# Patient Record
Sex: Female | Born: 1935 | Race: White | Hispanic: No | Marital: Married | Smoking: Never smoker
Health system: Southern US, Community
[De-identification: ages and names within clinical notes are randomized; demographics above are authoritative.]

## PROBLEM LIST (undated history)

## (undated) DIAGNOSIS — I10 Essential (primary) hypertension: Secondary | ICD-10-CM

## (undated) DIAGNOSIS — C50919 Malignant neoplasm of unspecified site of unspecified female breast: Secondary | ICD-10-CM

## (undated) DIAGNOSIS — C55 Malignant neoplasm of uterus, part unspecified: Secondary | ICD-10-CM

## (undated) HISTORY — DX: Malignant neoplasm of uterus, part unspecified: C55

## (undated) HISTORY — PX: COLONOSCOPY: SHX174

## (undated) HISTORY — PX: MASTECTOMY: SHX3

## (undated) HISTORY — DX: Malignant neoplasm of unspecified site of unspecified female breast: C50.919

## (undated) HISTORY — DX: Essential (primary) hypertension: I10

## (undated) HISTORY — PX: ABDOMINAL HYSTERECTOMY: SHX81

---

## 1999-12-09 ENCOUNTER — Other Ambulatory Visit: Admission: RE | Admit: 1999-12-09 | Discharge: 1999-12-09 | Payer: Self-pay | Admitting: *Deleted

## 2000-12-28 ENCOUNTER — Other Ambulatory Visit: Admission: RE | Admit: 2000-12-28 | Discharge: 2000-12-28 | Payer: Self-pay | Admitting: *Deleted

## 2003-05-09 ENCOUNTER — Other Ambulatory Visit: Admission: RE | Admit: 2003-05-09 | Discharge: 2003-05-09 | Payer: Self-pay | Admitting: *Deleted

## 2004-07-21 ENCOUNTER — Other Ambulatory Visit: Admission: RE | Admit: 2004-07-21 | Discharge: 2004-07-21 | Payer: Self-pay | Admitting: *Deleted

## 2004-12-08 ENCOUNTER — Encounter: Admission: RE | Admit: 2004-12-08 | Discharge: 2004-12-08 | Payer: Self-pay | Admitting: Cardiology

## 2006-11-25 ENCOUNTER — Other Ambulatory Visit: Admission: RE | Admit: 2006-11-25 | Discharge: 2006-11-25 | Payer: Self-pay | Admitting: *Deleted

## 2008-01-31 ENCOUNTER — Other Ambulatory Visit: Admission: RE | Admit: 2008-01-31 | Discharge: 2008-01-31 | Payer: Self-pay | Admitting: *Deleted

## 2010-01-24 ENCOUNTER — Encounter (INDEPENDENT_AMBULATORY_CARE_PROVIDER_SITE_OTHER): Payer: Self-pay | Admitting: *Deleted

## 2010-01-27 ENCOUNTER — Ambulatory Visit: Payer: Self-pay | Admitting: Gastroenterology

## 2010-02-03 ENCOUNTER — Ambulatory Visit: Payer: Self-pay | Admitting: Gastroenterology

## 2010-02-05 ENCOUNTER — Encounter: Payer: Self-pay | Admitting: Gastroenterology

## 2010-02-28 ENCOUNTER — Inpatient Hospital Stay (HOSPITAL_COMMUNITY): Admission: EM | Admit: 2010-02-28 | Discharge: 2010-03-10 | Payer: Self-pay | Admitting: Emergency Medicine

## 2010-02-28 ENCOUNTER — Ambulatory Visit: Payer: Self-pay | Admitting: Pulmonary Disease

## 2010-11-23 ENCOUNTER — Encounter: Payer: Self-pay | Admitting: Specialist

## 2010-12-02 NOTE — Letter (Signed)
Summary: [facility] Instructions  [facility]  [address] [place], [place] [address]   Phone: [phone]  Fax: [phone]       ELIZETTE SHEK    [birth year]    MRN: [id number]        Procedure Day /Date:  Monday 02/03/2010     Arrival Time: 8:30 am      Procedure Time: 9:30 am     Location of Procedure:                    _x _  [facility] (4th Floor)                        PREPARATION FOR COLONOSCOPY WITH MOVIPREP   Starting 5 days prior to your procedure Wednesday 3/30 do not eat nuts, seeds, popcorn, corn, beans, peas,  salads, or any raw vegetables.  Do not take any fiber supplements (e.g. Metamucil, Citrucel, and Benefiber).  THE DAY BEFORE YOUR PROCEDURE         DATE: Sunday 4/3  1.  Drink clear liquids the entire day-NO SOLID FOOD  2.  Do not drink anything colored red or purple.  Avoid juices with pulp.  No orange juice.  3.  Drink at least 64 oz. (8 glasses) of fluid/clear liquids during the day to prevent dehydration and help the prep work efficiently.  CLEAR LIQUIDS INCLUDE: Water Jello Ice Popsicles Tea (sugar ok, no milk/cream) Powdered fruit flavored drinks Coffee (sugar ok, no milk/cream) Gatorade Juice: apple, white grape, white cranberry  Lemonade Clear bullion, consomm, broth Carbonated beverages (any kind) Strained chicken noodle soup Hard Candy                             4.  In the morning, mix first dose of MoviPrep solution:    Empty 1 Pouch A and 1 Pouch B into the disposable container    Add lukewarm drinking water to the top line of the container. Mix to dissolve    Refrigerate (mixed solution should be used within 24 hrs)  5.  Begin drinking the prep at 5:00 p.m. The MoviPrep container is divided by 4 marks.   Every 15 minutes drink the solution down to the next mark (approximately 8 oz) until the full liter is complete.   6.  Follow completed prep with 16 oz of clear liquid of your choice (Nothing red  or purple).  Continue to drink clear liquids until bedtime.  7.  Before going to bed, mix second dose of MoviPrep solution:    Empty 1 Pouch A and 1 Pouch B into the disposable container    Add lukewarm drinking water to the top line of the container. Mix to dissolve    Refrigerate  THE DAY OF YOUR PROCEDURE      DATE: Monday 4/4  Beginning at 4:30 am (5 hours before procedure):         1. Every 15 minutes, drink the solution down to the next mark (approx 8 oz) until the full liter is complete.  2. Follow completed prep with 16 oz. of clear liquid of your choice.    3. You may drink clear liquids until 7:30 am (2 HOURS BEFORE PROCEDURE).   MEDICATION INSTRUCTIONS  Unless otherwise instructed, you should take regular prescription medications with a small sip of water   as early as possible the morning of  your procedure.    Additional medication instructions: Do not take fluid pill day of procedure.         OTHER INSTRUCTIONS  You will need a responsible adult at least 75 years of age to accompany you and drive you home.   This person must remain in the waiting room during your procedure.  Wear loose fitting clothing that is easily removed.  Leave jewelry and other valuables at home.  However, you may wish to bring a book to read or  an iPod/MP3 player to listen to music as you wait for your procedure to start.  Remove all body piercing jewelry and leave at home.  Total time from sign-in until discharge is approximately 2-3 hours.  You should go home directly after your procedure and rest.  You can resume normal activities the  day after your procedure.  The day of your procedure you should not:   Drive   Make legal decisions   Operate machinery   Drink alcohol   Return to work  You will receive specific instructions about eating, activities and medications before you leave.    The above instructions have been reviewed and explained to me by   Ezra Sites RN  January 27, 2010 1:24 PM    I fully understand and can verbalize these instructions _____________________________ Date _________

## 2010-12-02 NOTE — Procedures (Signed)
Summary: Colonoscopy  Patient: Cheryl Maldonado Note: All result statuses are Final unless otherwise noted.  Tests: (1) Colonoscopy (COL)   COL Colonoscopy           DONE     [facility]     [address]     [place], [place]  [address]           COLONOSCOPY PROCEDURE REPORT           PATIENT:  Cheryl Maldonado, Cheryl Maldonado  MR#:  [id number]     BIRTHDATE:  06/02/1933, 76 yrs. old  GENDER:  female     ENDOSCOPIST:  Rachael Fee, MD     REF. BY:  Thayer Headings, M.D.     PROCEDURE DATE:  02/03/2010     PROCEDURE:  Colonoscopy with snare polypectomy     ASA CLASS:  Class II     INDICATIONS:  Routine Risk Screening     MEDICATIONS:   Fentanyl 75 mcg IV, Versed 7 mg IV           DESCRIPTION OF PROCEDURE:   After the risks benefits and     alternatives of the procedure were thoroughly explained, informed     consent was obtained.  Digital rectal exam was performed and     revealed no rectal masses.   The LB PCF-H180AL [id number] endoscope     was introduced through the anus and advanced to the cecum, which     was identified by both the appendix and ileocecal valve, without     limitations.  The quality of the prep was excellent, using     MoviPrep.  The instrument was then slowly withdrawn as the colon     was fully examined.     <<PROCEDUREIMAGES>>           FINDINGS:  A pedunculated polyp was found in the sigmoid colon.     This was 7mm across, broad based, completely removed with     snare/cautery and sent to pathology (jar 1) (see image4 and     image5).  This was otherwise a normal examination of the colon     (see image6, image2, and image1).   Retroflexed views in the     rectum revealed no abnormalities.    The scope was then withdrawn     from the patient and the procedure completed.           COMPLICATIONS:  None     ENDOSCOPIC IMPRESSION:     1) Pedunculated polyp in the sigmoid colon, this was removed and     sent to pathology     2) Otherwise normal examination          RECOMMENDATIONS:     1) If the polyp(s) removed today are proven to be adenomatous     (pre-cancerous) polyps, you will need a repeat colonoscopy in 5     years. Otherwise you should continue to follow colorectal cancer     screening guidelines for "routine risk" patients with colonoscopy     in 10 years.     2) You will receive a letter within 1-2 weeks with the results     of your biopsy as well as final recommendations. Please call my     office if you have not received a letter after 3 weeks.           ______________________________     Rachael Fee, MD  n.     eSIGNED:   Rachael Fee at 02/03/2010 09:37 AM           Levey, Karie Kirks, [phone]  Note: An exclamation mark (!) indicates a result that was not dispersed into the flowsheet. Document Creation Date: 02/03/2010 9:38 AM _______________________________________________________________________  (1) Order result status: Final Collection or observation date-time: 02/03/2010 09:32 Requested date-time:  Receipt date-time:  Reported date-time:  Referring Physician:   Ordering Physician: Rob Bunting [phone]) Specimen Source:  Source: Launa Grill Order Number: [id number] Lab site:   Appended Document: Colonoscopy     Procedures Next Due Date:    Colonoscopy: 02/2015

## 2010-12-02 NOTE — Miscellaneous (Signed)
Summary: [facility]  Clinical Lists Changes  Medications: Added new medication of MOVIPREP 100 GM  SOLR (PEG-KCL-NACL-NASULF-NA ASC-C) As per prep instructions. - Signed Rx of MOVIPREP 100 GM  SOLR (PEG-KCL-NACL-NASULF-NA ASC-C) As per prep instructions.;  #1 x 0;  Signed;  Entered by: Ezra Sites RN;  Authorized by: Rachael Fee MD;  Method used: Electronically to [facility]*, [address] [place], [place]  [id number], Ph: [phone], Fax: [phone] Observations: Added new observation of NKA: T (01/27/2010 12:53)    Prescriptions: MOVIPREP 100 GM  SOLR (PEG-KCL-NACL-NASULF-NA ASC-C) As per prep instructions.  #1 x 0   Entered by:   Ezra Sites RN   Authorized by:   Rachael Fee MD   Signed by:   Ezra Sites RN on 01/27/2010   Method used:   Electronically to        [facility]* (retail)       [address]       [place], [place]  [id number]       Ph: [phone]       Fax: [phone]   RxID:   [id number]

## 2010-12-02 NOTE — Letter (Signed)
Summary: Results Letter  [facility]  [address] [place], [place] [address]   Phone: [phone]  Fax: [phone]        February 05, 2010 MRN: [id number]    BOBI DAUDELIN [address] [place], [place]  [address]    Dear Ms. Langan,   The polyp removed during your recent procedure was proven to be adenomatous.  These are pre-cancerous polyps that may have grown into cancers if they had not been removed.  Based on current nationally recognized surveillance guidelines, I recommend that you have a repeat colonoscopy in 5 years.  We will therefore put your information in our reminder system and will contact you in 5 years to schedule a repeat procedure.  Please call if you have any questions or concerns.       Sincerely,  Rachael Fee MD  This letter has been electronically signed by your physician.  Appended Document: Results Letter letter mailed 4.6.11

## 2011-01-20 LAB — PHOSPHORUS
Phosphorus: 1.1 mg/dL — ABNORMAL LOW (ref 2.3–4.6)
Phosphorus: 2.4 mg/dL (ref 2.3–4.6)
Phosphorus: 2.5 mg/dL (ref 2.3–4.6)
Phosphorus: 4 mg/dL (ref 2.3–4.6)

## 2011-01-20 LAB — POCT I-STAT 3, ART BLOOD GAS (G3+)
Acid-base deficit: 3 mmol/L — ABNORMAL HIGH (ref 0.0–2.0)
Acid-base deficit: 8 mmol/L — ABNORMAL HIGH (ref 0.0–2.0)
Bicarbonate: 15.4 mEq/L — ABNORMAL LOW (ref 20.0–24.0)
Bicarbonate: 18.7 meq/L — ABNORMAL LOW (ref 20.0–24.0)
O2 Saturation: 90 %
O2 Saturation: 96 %
Patient temperature: 104.7
Patient temperature: 98.3
TCO2: 16 mmol/L (ref 0–100)
TCO2: 19 mmol/L (ref 0–100)
pCO2 arterial: 25.1 mmHg — ABNORMAL LOW (ref 35.0–45.0)
pCO2 arterial: 26.6 mmHg — ABNORMAL LOW (ref 35.0–45.0)
pH, Arterial: 7.396 (ref 7.350–7.400)
pH, Arterial: 7.467 — ABNORMAL HIGH (ref 7.350–7.400)
pO2, Arterial: 57 mmHg — ABNORMAL LOW (ref 80.0–100.0)
pO2, Arterial: 86 mmHg (ref 80.0–100.0)

## 2011-01-20 LAB — URINALYSIS, ROUTINE W REFLEX MICROSCOPIC
Bilirubin Urine: NEGATIVE
Glucose, UA: NEGATIVE mg/dL
Glucose, UA: NEGATIVE mg/dL
Ketones, ur: NEGATIVE mg/dL
Leukocytes, UA: NEGATIVE
Nitrite: NEGATIVE
Nitrite: POSITIVE — AB
Protein, ur: 30 mg/dL — AB
Protein, ur: NEGATIVE mg/dL
Specific Gravity, Urine: 1.008 (ref 1.005–1.030)
Specific Gravity, Urine: 1.012 (ref 1.005–1.030)
Specific Gravity, Urine: 1.015 (ref 1.005–1.030)
Urobilinogen, UA: 0.2 mg/dL (ref 0.0–1.0)
Urobilinogen, UA: 0.2 mg/dL (ref 0.0–1.0)
pH: 6 (ref 5.0–8.0)
pH: 7 (ref 5.0–8.0)

## 2011-01-20 LAB — BASIC METABOLIC PANEL WITH GFR
BUN: 17 mg/dL (ref 6–23)
BUN: 7 mg/dL (ref 6–23)
BUN: 7 mg/dL (ref 6–23)
BUN: 7 mg/dL (ref 6–23)
CO2: 26 meq/L (ref 19–32)
CO2: 26 meq/L (ref 19–32)
CO2: 26 meq/L (ref 19–32)
CO2: 27 meq/L (ref 19–32)
Calcium: 6.8 mg/dL — ABNORMAL LOW (ref 8.4–10.5)
Calcium: 8.3 mg/dL — ABNORMAL LOW (ref 8.4–10.5)
Calcium: 8.3 mg/dL — ABNORMAL LOW (ref 8.4–10.5)
Calcium: 8.6 mg/dL (ref 8.4–10.5)
Chloride: 100 meq/L (ref 96–112)
Chloride: 101 meq/L (ref 96–112)
Chloride: 103 meq/L (ref 96–112)
Chloride: 96 meq/L (ref 96–112)
Creatinine, Ser: 0.66 mg/dL (ref 0.4–1.2)
Creatinine, Ser: 0.7 mg/dL (ref 0.4–1.2)
Creatinine, Ser: 0.76 mg/dL (ref 0.4–1.2)
Creatinine, Ser: 0.92 mg/dL (ref 0.4–1.2)
GFR calc non Af Amer: 60 mL/min — ABNORMAL LOW
GFR calc non Af Amer: >60 mL/min
GFR calc non Af Amer: >60 mL/min
GFR calc non Af Amer: >60 mL/min
Glucose, Bld: 100 mg/dL — ABNORMAL HIGH (ref 70–99)
Glucose, Bld: 127 mg/dL — ABNORMAL HIGH (ref 70–99)
Glucose, Bld: 73 mg/dL (ref 70–99)
Glucose, Bld: 98 mg/dL (ref 70–99)
Potassium: 2.8 meq/L — ABNORMAL LOW (ref 3.5–5.1)
Potassium: 3.4 meq/L — ABNORMAL LOW (ref 3.5–5.1)
Potassium: 3.5 meq/L (ref 3.5–5.1)
Potassium: 3.9 meq/L (ref 3.5–5.1)
Sodium: 133 meq/L — ABNORMAL LOW (ref 135–145)
Sodium: 134 meq/L — ABNORMAL LOW (ref 135–145)
Sodium: 134 meq/L — ABNORMAL LOW (ref 135–145)
Sodium: 136 meq/L (ref 135–145)

## 2011-01-20 LAB — CBC
HCT: 23.1 % — ABNORMAL LOW (ref 36.0–46.0)
HCT: 25.6 % — ABNORMAL LOW (ref 36.0–46.0)
HCT: 27.1 % — ABNORMAL LOW (ref 36.0–46.0)
HCT: 28.1 % — ABNORMAL LOW (ref 36.0–46.0)
HCT: 29.5 % — ABNORMAL LOW (ref 36.0–46.0)
HCT: 30.2 % — ABNORMAL LOW (ref 36.0–46.0)
HCT: 30.8 % — ABNORMAL LOW (ref 36.0–46.0)
HCT: 24 % — ABNORMAL LOW (ref 36.0–46.0)
HCT: 32.4 % — ABNORMAL LOW (ref 36.0–46.0)
Hemoglobin: 10.2 g/dL — ABNORMAL LOW (ref 12.0–15.0)
Hemoglobin: 10.4 g/dL — ABNORMAL LOW (ref 12.0–15.0)
Hemoglobin: 10.5 g/dL — ABNORMAL LOW (ref 12.0–15.0)
Hemoglobin: 8.1 g/dL — ABNORMAL LOW (ref 12.0–15.0)
Hemoglobin: 8.5 g/dL — ABNORMAL LOW (ref 12.0–15.0)
Hemoglobin: 9.3 g/dL — ABNORMAL LOW (ref 12.0–15.0)
Hemoglobin: 9.6 g/dL — ABNORMAL LOW (ref 12.0–15.0)
Hemoglobin: 11.5 g/dL — ABNORMAL LOW (ref 12.0–15.0)
Hemoglobin: 8.5 g/dL — ABNORMAL LOW (ref 12.0–15.0)
MCHC: 34 g/dL (ref 30.0–36.0)
MCHC: 34.1 g/dL (ref 30.0–36.0)
MCHC: 34.1 g/dL (ref 30.0–36.0)
MCHC: 34.5 g/dL (ref 30.0–36.0)
MCHC: 34.5 g/dL (ref 30.0–36.0)
MCHC: 34.9 g/dL (ref 30.0–36.0)
MCHC: 34.9 g/dL (ref 30.0–36.0)
MCHC: 35 g/dL (ref 30.0–36.0)
MCHC: 34.8 g/dL (ref 30.0–36.0)
MCHC: 35.3 g/dL (ref 30.0–36.0)
MCHC: 35.3 g/dL (ref 30.0–36.0)
MCV: 91.5 fL (ref 78.0–100.0)
MCV: 92.1 fL (ref 78.0–100.0)
MCV: 92.3 fL (ref 78.0–100.0)
MCV: 92.9 fL (ref 78.0–100.0)
MCV: 93.3 fL (ref 78.0–100.0)
MCV: 93.6 fL (ref 78.0–100.0)
MCV: 93.8 fL (ref 78.0–100.0)
MCV: 92.1 fL (ref 78.0–100.0)
MCV: 92.5 fL (ref 78.0–100.0)
MCV: 93.3 fL (ref 78.0–100.0)
Platelets: 140 10*3/uL — ABNORMAL LOW (ref 150–400)
Platelets: 155 10*3/uL (ref 150–400)
Platelets: 199 10*3/uL (ref 150–400)
Platelets: 239 10*3/uL (ref 150–400)
Platelets: 336 10*3/uL (ref 150–400)
Platelets: 413 10*3/uL — ABNORMAL HIGH (ref 150–400)
Platelets: 142 K/uL — ABNORMAL LOW (ref 150–400)
Platelets: 148 10*3/uL — ABNORMAL LOW (ref 150–400)
Platelets: 188 10*3/uL (ref 150–400)
RBC: 2.51 MIL/uL — ABNORMAL LOW (ref 3.87–5.11)
RBC: 2.8 MIL/uL — ABNORMAL LOW (ref 3.87–5.11)
RBC: 2.9 MIL/uL — ABNORMAL LOW (ref 3.87–5.11)
RBC: 3 MIL/uL — ABNORMAL LOW (ref 3.87–5.11)
RBC: 3.17 MIL/uL — ABNORMAL LOW (ref 3.87–5.11)
RBC: 3.28 MIL/uL — ABNORMAL LOW (ref 3.87–5.11)
RBC: 3.3 MIL/uL — ABNORMAL LOW (ref 3.87–5.11)
RBC: 2.58 MIL/uL — ABNORMAL LOW (ref 3.87–5.11)
RBC: 3.52 MIL/uL — ABNORMAL LOW (ref 3.87–5.11)
RDW: 13.7 % (ref 11.5–15.5)
RDW: 13.9 % (ref 11.5–15.5)
RDW: 13.9 % (ref 11.5–15.5)
RDW: 14 % (ref 11.5–15.5)
RDW: 14.2 % (ref 11.5–15.5)
RDW: 12.9 % (ref 11.5–15.5)
RDW: 12.9 % (ref 11.5–15.5)
WBC: 12.6 10*3/uL — ABNORMAL HIGH (ref 4.0–10.5)
WBC: 14.6 10*3/uL — ABNORMAL HIGH (ref 4.0–10.5)
WBC: 16.1 10*3/uL — ABNORMAL HIGH (ref 4.0–10.5)
WBC: 20 10*3/uL — ABNORMAL HIGH (ref 4.0–10.5)
WBC: 25.1 10*3/uL — ABNORMAL HIGH (ref 4.0–10.5)
WBC: 26.2 10*3/uL — ABNORMAL HIGH (ref 4.0–10.5)
WBC: 28.7 10*3/uL — ABNORMAL HIGH (ref 4.0–10.5)
WBC: 38.4 10*3/uL — ABNORMAL HIGH (ref 4.0–10.5)
WBC: 21.6 10*3/uL — ABNORMAL HIGH (ref 4.0–10.5)
WBC: 4.1 10*3/uL (ref 4.0–10.5)

## 2011-01-20 LAB — GLUCOSE, CAPILLARY
Glucose-Capillary: 101 mg/dL — ABNORMAL HIGH (ref 70–99)
Glucose-Capillary: 101 mg/dL — ABNORMAL HIGH (ref 70–99)
Glucose-Capillary: 102 mg/dL — ABNORMAL HIGH (ref 70–99)
Glucose-Capillary: 102 mg/dL — ABNORMAL HIGH (ref 70–99)
Glucose-Capillary: 105 mg/dL — ABNORMAL HIGH (ref 70–99)
Glucose-Capillary: 106 mg/dL — ABNORMAL HIGH (ref 70–99)
Glucose-Capillary: 107 mg/dL — ABNORMAL HIGH (ref 70–99)
Glucose-Capillary: 110 mg/dL — ABNORMAL HIGH (ref 70–99)
Glucose-Capillary: 110 mg/dL — ABNORMAL HIGH (ref 70–99)
Glucose-Capillary: 111 mg/dL — ABNORMAL HIGH (ref 70–99)
Glucose-Capillary: 116 mg/dL — ABNORMAL HIGH (ref 70–99)
Glucose-Capillary: 117 mg/dL — ABNORMAL HIGH (ref 70–99)
Glucose-Capillary: 119 mg/dL — ABNORMAL HIGH (ref 70–99)
Glucose-Capillary: 121 mg/dL — ABNORMAL HIGH (ref 70–99)
Glucose-Capillary: 121 mg/dL — ABNORMAL HIGH (ref 70–99)
Glucose-Capillary: 122 mg/dL — ABNORMAL HIGH (ref 70–99)
Glucose-Capillary: 131 mg/dL — ABNORMAL HIGH (ref 70–99)
Glucose-Capillary: 132 mg/dL — ABNORMAL HIGH (ref 70–99)
Glucose-Capillary: 134 mg/dL — ABNORMAL HIGH (ref 70–99)
Glucose-Capillary: 134 mg/dL — ABNORMAL HIGH (ref 70–99)
Glucose-Capillary: 137 mg/dL — ABNORMAL HIGH (ref 70–99)
Glucose-Capillary: 138 mg/dL — ABNORMAL HIGH (ref 70–99)
Glucose-Capillary: 145 mg/dL — ABNORMAL HIGH (ref 70–99)
Glucose-Capillary: 156 mg/dL — ABNORMAL HIGH (ref 70–99)
Glucose-Capillary: 160 mg/dL — ABNORMAL HIGH (ref 70–99)
Glucose-Capillary: 168 mg/dL — ABNORMAL HIGH (ref 70–99)
Glucose-Capillary: 69 mg/dL — ABNORMAL LOW (ref 70–99)
Glucose-Capillary: 76 mg/dL (ref 70–99)
Glucose-Capillary: 77 mg/dL (ref 70–99)
Glucose-Capillary: 78 mg/dL (ref 70–99)
Glucose-Capillary: 79 mg/dL (ref 70–99)
Glucose-Capillary: 86 mg/dL (ref 70–99)
Glucose-Capillary: 89 mg/dL (ref 70–99)
Glucose-Capillary: 91 mg/dL (ref 70–99)
Glucose-Capillary: 97 mg/dL (ref 70–99)
Glucose-Capillary: 99 mg/dL (ref 70–99)
Glucose-Capillary: 109 mg/dL — ABNORMAL HIGH (ref 70–99)
Glucose-Capillary: 112 mg/dL — ABNORMAL HIGH (ref 70–99)
Glucose-Capillary: 123 mg/dL — ABNORMAL HIGH (ref 70–99)
Glucose-Capillary: 130 mg/dL — ABNORMAL HIGH (ref 70–99)
Glucose-Capillary: 139 mg/dL — ABNORMAL HIGH (ref 70–99)
Glucose-Capillary: 142 mg/dL — ABNORMAL HIGH (ref 70–99)
Glucose-Capillary: 143 mg/dL — ABNORMAL HIGH (ref 70–99)
Glucose-Capillary: 145 mg/dL — ABNORMAL HIGH (ref 70–99)
Glucose-Capillary: 99 mg/dL (ref 70–99)

## 2011-01-20 LAB — CARBOXYHEMOGLOBIN
Carboxyhemoglobin: 0.9 % (ref 0.5–1.5)
Carboxyhemoglobin: 1.3 % (ref 0.5–1.5)
Methemoglobin: 0.5 % (ref 0.0–1.5)
Methemoglobin: 1 % (ref 0.0–1.5)
O2 Saturation: 49.5 %
O2 Saturation: 52.2 %
Total hemoglobin: 8.6 g/dL — ABNORMAL LOW (ref 12.5–16.0)
Total hemoglobin: 9 g/dL — ABNORMAL LOW (ref 12.5–16.0)
Total hemoglobin: 9.4 g/dL — ABNORMAL LOW (ref 12.5–16.0)
Total hemoglobin: 9.5 g/dL — ABNORMAL LOW (ref 12.5–16.0)

## 2011-01-20 LAB — COMPREHENSIVE METABOLIC PANEL
ALT: 38 U/L — ABNORMAL HIGH (ref 0–35)
AST: 59 U/L — ABNORMAL HIGH (ref 0–37)
CO2: 22 mEq/L (ref 19–32)
Calcium: 8.6 mg/dL (ref 8.4–10.5)
Chloride: 84 mEq/L — ABNORMAL LOW (ref 96–112)
GFR calc Af Amer: 33 mL/min — ABNORMAL LOW (ref >60)
GFR calc non Af Amer: 27 mL/min — ABNORMAL LOW (ref >60)
Sodium: 119 mEq/L — CL (ref 135–145)

## 2011-01-20 LAB — BASIC METABOLIC PANEL
BUN: 10 mg/dL (ref 6–23)
BUN: 8 mg/dL (ref 6–23)
BUN: 9 mg/dL (ref 6–23)
BUN: 10 mg/dL (ref 6–23)
BUN: 18 mg/dL (ref 6–23)
CO2: 15 mEq/L — ABNORMAL LOW (ref 19–32)
CO2: 16 mEq/L — ABNORMAL LOW (ref 19–32)
CO2: 26 mEq/L (ref 19–32)
CO2: 29 mEq/L (ref 19–32)
CO2: 15 mEq/L — ABNORMAL LOW (ref 19–32)
Calcium: 7.1 mg/dL — ABNORMAL LOW (ref 8.4–10.5)
Calcium: 7.4 mg/dL — ABNORMAL LOW (ref 8.4–10.5)
Calcium: 7.7 mg/dL — ABNORMAL LOW (ref 8.4–10.5)
Calcium: 7.9 mg/dL — ABNORMAL LOW (ref 8.4–10.5)
Calcium: 6.9 mg/dL — ABNORMAL LOW (ref 8.4–10.5)
Chloride: 101 mEq/L (ref 96–112)
Chloride: 101 mEq/L (ref 96–112)
Chloride: 98 mEq/L (ref 96–112)
Chloride: 105 mEq/L (ref 96–112)
Chloride: 98 mEq/L (ref 96–112)
Creatinine, Ser: 0.75 mg/dL (ref 0.4–1.2)
Creatinine, Ser: 0.78 mg/dL (ref 0.4–1.2)
Creatinine, Ser: 0.81 mg/dL (ref 0.4–1.2)
Creatinine, Ser: 0.81 mg/dL (ref 0.4–1.2)
Creatinine, Ser: 0.84 mg/dL (ref 0.4–1.2)
Creatinine, Ser: 0.97 mg/dL (ref 0.4–1.2)
Creatinine, Ser: 1.13 mg/dL (ref 0.4–1.2)
GFR calc Af Amer: >60 mL/min (ref >60)
GFR calc Af Amer: >60 mL/min (ref >60)
GFR calc Af Amer: >60 mL/min (ref >60)
GFR calc Af Amer: >60 mL/min (ref >60)
GFR calc Af Amer: >60 mL/min (ref >60)
GFR calc Af Amer: 57 mL/min — ABNORMAL LOW (ref >60)
GFR calc non Af Amer: >60 mL/min (ref >60)
GFR calc non Af Amer: >60 mL/min (ref >60)
Glucose, Bld: 101 mg/dL — ABNORMAL HIGH (ref 70–99)
Glucose, Bld: 129 mg/dL — ABNORMAL HIGH (ref 70–99)
Glucose, Bld: 147 mg/dL — ABNORMAL HIGH (ref 70–99)
Potassium: 3 mEq/L — ABNORMAL LOW (ref 3.5–5.1)
Potassium: 3.2 mEq/L — ABNORMAL LOW (ref 3.5–5.1)
Potassium: 3.5 mEq/L (ref 3.5–5.1)
Potassium: 3.7 mEq/L (ref 3.5–5.1)
Potassium: 2.3 mEq/L — CL (ref 3.5–5.1)
Sodium: 128 mEq/L — ABNORMAL LOW (ref 135–145)
Sodium: 121 mEq/L — ABNORMAL LOW (ref 135–145)

## 2011-01-20 LAB — POCT I-STAT, CHEM 8
BUN: 22 mg/dL (ref 6–23)
Calcium, Ion: 0.8 mmol/L — ABNORMAL LOW (ref 1.12–1.32)
Chloride: 90 meq/L — ABNORMAL LOW (ref 96–112)
Creatinine, Ser: 1.9 mg/dL — ABNORMAL HIGH (ref 0.4–1.2)
Glucose, Bld: 134 mg/dL — ABNORMAL HIGH (ref 70–99)
HCT: 33 % — ABNORMAL LOW (ref 36.0–46.0)
Hemoglobin: 11.2 g/dL — ABNORMAL LOW (ref 12.0–15.0)
Potassium: 3.8 mEq/L (ref 3.5–5.1)
Sodium: 114 meq/L — CL (ref 135–145)
TCO2: 17 mmol/L (ref 0–100)

## 2011-01-20 LAB — DIFFERENTIAL
Basophils Absolute: 0 10*3/uL (ref 0.0–0.1)
Basophils Relative: 0 % (ref 0–1)
Eosinophils Absolute: 0 10*3/uL (ref 0.0–0.7)
Eosinophils Relative: 0 % (ref 0–5)
Lymphocytes Relative: 5 % — ABNORMAL LOW (ref 12–46)
Lymphs Abs: 0.2 10*3/uL — ABNORMAL LOW (ref 0.7–4.0)
Monocytes Absolute: 0 10*3/uL — ABNORMAL LOW (ref 0.1–1.0)
Monocytes Relative: 1 % — ABNORMAL LOW (ref 3–12)
Neutro Abs: 3.9 10*3/uL (ref 1.7–7.7)
Neutrophils Relative %: 94 % — ABNORMAL HIGH (ref 43–77)

## 2011-01-20 LAB — URINE CULTURE: Colony Count: >=100000

## 2011-01-20 LAB — MAGNESIUM
Magnesium: 1.9 mg/dL (ref 1.5–2.5)
Magnesium: 2 mg/dL (ref 1.5–2.5)
Magnesium: 2.4 mg/dL (ref 1.5–2.5)

## 2011-01-20 LAB — URINE MICROSCOPIC-ADD ON

## 2011-01-20 LAB — LIPASE, BLOOD: Lipase: 16 U/L (ref 11–59)

## 2011-01-20 LAB — TYPE AND SCREEN: ABO/RH(D): A POS

## 2011-01-20 LAB — LACTIC ACID, PLASMA
Lactic Acid, Venous: 2.4 mmol/L — ABNORMAL HIGH (ref 0.5–2.2)
Lactic Acid, Venous: 6 mmol/L — ABNORMAL HIGH (ref 0.5–2.2)

## 2011-01-20 LAB — MRSA PCR SCREENING: MRSA by PCR: NEGATIVE

## 2011-01-20 LAB — ABO/RH: ABO/RH(D): A POS

## 2011-01-20 LAB — CULTURE, BLOOD (ROUTINE X 2)

## 2011-01-20 LAB — TSH: TSH: 0.893 u[IU]/mL (ref 0.350–4.500)

## 2011-01-20 LAB — BRAIN NATRIURETIC PEPTIDE
Pro B Natriuretic peptide (BNP): 159 pg/mL — ABNORMAL HIGH (ref 0.0–100.0)
Pro B Natriuretic peptide (BNP): 166 pg/mL — ABNORMAL HIGH (ref 0.0–100.0)
Pro B Natriuretic peptide (BNP): 254 pg/mL — ABNORMAL HIGH (ref 0.0–100.0)
Pro B Natriuretic peptide (BNP): 474 pg/mL — ABNORMAL HIGH (ref 0.0–100.0)

## 2011-01-20 LAB — APTT: aPTT: 34 seconds (ref 24–37)

## 2011-01-20 LAB — HEMOCCULT GUIAC POC 1CARD (OFFICE): Fecal Occult Bld: POSITIVE

## 2011-01-20 LAB — CORTISOL: Cortisol, Plasma: 85.4 ug/dL

## 2011-01-20 LAB — PROCALCITONIN

## 2011-01-20 LAB — T4: T4, Total: 6 ug/dL (ref 5.0–12.5)

## 2011-01-20 LAB — HEMOGLOBIN A1C: Mean Plasma Glucose: 117 mg/dL — ABNORMAL HIGH (ref <117)

## 2011-01-20 LAB — AMYLASE: Amylase: 74 U/L (ref 0–105)

## 2011-01-20 LAB — STREP PNEUMONIAE URINARY ANTIGEN: Strep Pneumo Urinary Antigen: NEGATIVE

## 2011-04-20 ENCOUNTER — Ambulatory Visit: Payer: Medicare Other | Attending: Internal Medicine | Admitting: Physical Therapy

## 2011-04-20 DIAGNOSIS — I890 Lymphedema, not elsewhere classified: Secondary | ICD-10-CM | POA: Insufficient documentation

## 2011-04-20 DIAGNOSIS — IMO0001 Reserved for inherently not codable concepts without codable children: Secondary | ICD-10-CM | POA: Insufficient documentation

## 2011-06-15 ENCOUNTER — Encounter: Payer: Medicare Other | Admitting: Physical Therapy

## 2011-06-17 ENCOUNTER — Encounter: Payer: Medicare Other | Admitting: Physical Therapy

## 2011-06-19 ENCOUNTER — Encounter: Payer: Medicare Other | Admitting: Physical Therapy

## 2011-06-22 ENCOUNTER — Ambulatory Visit: Payer: Medicare Other | Attending: Internal Medicine | Admitting: Physical Therapy

## 2011-06-22 DIAGNOSIS — IMO0001 Reserved for inherently not codable concepts without codable children: Secondary | ICD-10-CM | POA: Insufficient documentation

## 2011-06-22 DIAGNOSIS — I890 Lymphedema, not elsewhere classified: Secondary | ICD-10-CM | POA: Insufficient documentation

## 2011-06-24 ENCOUNTER — Ambulatory Visit: Payer: Medicare Other | Admitting: Physical Therapy

## 2011-06-26 ENCOUNTER — Ambulatory Visit: Payer: Medicare Other | Admitting: Physical Therapy

## 2011-06-29 ENCOUNTER — Ambulatory Visit: Payer: Medicare Other | Admitting: Physical Therapy

## 2011-07-01 ENCOUNTER — Encounter: Payer: Medicare Other | Admitting: Physical Therapy

## 2011-07-01 ENCOUNTER — Ambulatory Visit: Payer: Medicare Other | Admitting: Physical Therapy

## 2011-07-03 ENCOUNTER — Encounter: Payer: Medicare Other | Admitting: Physical Therapy

## 2011-07-08 ENCOUNTER — Ambulatory Visit: Payer: Medicare Other | Attending: Internal Medicine | Admitting: Physical Therapy

## 2011-07-08 DIAGNOSIS — I890 Lymphedema, not elsewhere classified: Secondary | ICD-10-CM | POA: Insufficient documentation

## 2011-07-08 DIAGNOSIS — IMO0001 Reserved for inherently not codable concepts without codable children: Secondary | ICD-10-CM | POA: Insufficient documentation

## 2011-07-10 ENCOUNTER — Ambulatory Visit: Payer: Medicare Other | Admitting: Physical Therapy

## 2011-07-13 ENCOUNTER — Ambulatory Visit: Payer: Medicare Other | Admitting: Physical Therapy

## 2011-07-15 ENCOUNTER — Ambulatory Visit: Payer: Medicare Other | Admitting: Physical Therapy

## 2011-07-17 ENCOUNTER — Ambulatory Visit: Payer: Medicare Other | Admitting: Physical Therapy

## 2011-07-20 ENCOUNTER — Ambulatory Visit: Payer: Medicare Other | Admitting: Physical Therapy

## 2011-07-22 ENCOUNTER — Ambulatory Visit: Payer: Medicare Other | Admitting: Physical Therapy

## 2011-07-24 ENCOUNTER — Ambulatory Visit: Payer: Medicare Other | Admitting: Physical Therapy

## 2011-07-27 ENCOUNTER — Ambulatory Visit: Payer: Medicare Other | Admitting: Physical Therapy

## 2011-07-29 ENCOUNTER — Ambulatory Visit: Payer: Medicare Other | Admitting: Physical Therapy

## 2011-07-31 ENCOUNTER — Ambulatory Visit: Payer: Medicare Other | Admitting: Physical Therapy

## 2011-08-03 ENCOUNTER — Ambulatory Visit: Payer: Medicare Other | Attending: Internal Medicine | Admitting: Physical Therapy

## 2011-08-03 DIAGNOSIS — IMO0001 Reserved for inherently not codable concepts without codable children: Secondary | ICD-10-CM | POA: Insufficient documentation

## 2011-08-03 DIAGNOSIS — I890 Lymphedema, not elsewhere classified: Secondary | ICD-10-CM | POA: Insufficient documentation

## 2011-08-05 ENCOUNTER — Ambulatory Visit: Payer: Medicare Other | Admitting: Physical Therapy

## 2011-08-07 ENCOUNTER — Ambulatory Visit: Payer: Medicare Other | Admitting: Physical Therapy

## 2011-08-10 ENCOUNTER — Ambulatory Visit: Payer: Medicare Other | Admitting: Physical Therapy

## 2011-08-12 ENCOUNTER — Ambulatory Visit: Payer: Medicare Other | Admitting: Physical Therapy

## 2011-08-13 ENCOUNTER — Encounter: Payer: Medicare Other | Admitting: Physical Therapy

## 2011-08-14 ENCOUNTER — Ambulatory Visit: Payer: Medicare Other | Admitting: Physical Therapy

## 2011-09-02 ENCOUNTER — Encounter: Payer: Medicare Other | Admitting: Physical Therapy

## 2015-02-12 ENCOUNTER — Encounter: Payer: Self-pay | Admitting: Gastroenterology

## 2015-03-05 ENCOUNTER — Other Ambulatory Visit: Payer: Self-pay | Admitting: Dermatology

## 2015-04-12 ENCOUNTER — Encounter: Payer: Self-pay | Admitting: Gastroenterology

## 2015-07-22 ENCOUNTER — Encounter: Payer: Self-pay | Admitting: Gastroenterology

## 2015-12-27 ENCOUNTER — Encounter: Payer: Self-pay | Admitting: Gastroenterology

## 2016-02-19 ENCOUNTER — Encounter: Payer: Self-pay | Admitting: Gastroenterology

## 2016-02-19 ENCOUNTER — Ambulatory Visit (INDEPENDENT_AMBULATORY_CARE_PROVIDER_SITE_OTHER): Payer: Medicare Other | Admitting: Gastroenterology

## 2016-02-19 VITALS — BP 136/60 | HR 78 | Ht 61.0 in | Wt 103.0 lb

## 2016-02-19 DIAGNOSIS — Z86010 Personal history of colonic polyps: Secondary | ICD-10-CM

## 2016-02-19 NOTE — Progress Notes (Signed)
Review of pertinent gastrointestinal problems: 1. Adenomatous polyps: 47mm TA without HGD removed during colonoscopy 2011 Dr. Ardis Hughs, done for routine screening.   HPI: This is a   very pleasant 82 year old woman my last saw about 6 years ago at the time of colonoscopy. See those results above. Chief complaint is history of colon polyps  That was the first colonoscopy she ever had  No bowel troubles; no bleeding, no changes she is concerned about.  No FH of colon cancer.  An admitted health nut.  Eats very well, no issues with her bowels.  Husband passed recently, about a week and half ago. She is dealing with a lot of arrangements, logistics since then. Clearly still grieving as well     Past Medical History  Diagnosis Date  . Breast cancer ([place])   . Uterine cancer ([place])   . Hypertension     Past Surgical History  Procedure Laterality Date  . Abdominal hysterectomy      Current Outpatient Prescriptions  Medication Sig Dispense Refill  . amLODipine (NORVASC) 5 MG tablet Take 5 mg by mouth daily.    . irbesartan (AVAPRO) 300 MG tablet Take 300 mg by mouth daily.    Marland Kitchen levothyroxine (SYNTHROID, LEVOTHROID) 75 MCG tablet Take 75 mcg by mouth daily before breakfast.    . raloxifene (EVISTA) 60 MG tablet Take 60 mg by mouth daily.    . simvastatin (ZOCOR) 5 MG tablet Take 5 mg by mouth every other day.     No current facility-administered medications for this visit.    Allergies as of 02/19/2016  . (No Known Allergies)    History reviewed. No pertinent family history.  Social History   Social History  . Marital Status: Married    Spouse Name: N/A  . Number of Children: 2  . Years of Education: N/A   Occupational History  . retired    Social History Main Topics  . Smoking status: Never Smoker   . Smokeless tobacco: Not on file  . Alcohol Use: No  . Drug Use: Not on file  . Sexual Activity: Not on file   Other Topics Concern  . Not on file   Social  History Narrative  . No narrative on file     Physical Exam: BP 136/60 mmHg  Pulse 78  Ht 5\' 1"  (1.549 m)  Wt 103 lb (46.72 kg)  BMI 19.47 kg/m2 Constitutional: generally well-appearing Psychiatric: alert and oriented x3 Abdomen: soft, nontender, nondistended, no obvious ascites, no peritoneal signs, normal bowel sounds   Assessment and plan: 82 y.o. female with Personal history of adenomatous colon polyp  We had a very nice discussion about the nature of colon cancer screening, polyp surveillance, guideline recommendations. At her age, almost 64, I recommended against any further colon cancer screening or polyp surveillance testing. She does very clearly understand however that that does not mean we would ignore any symptoms if she starts to have them. She will look out for bowel changes, bleeding, significant abdominal pains and she will give [place] a call if she does. She has had none of those fortunately.   Owens Loffler, MD [facility] 02/19/2016, 11:21 AM

## 2016-02-19 NOTE — Patient Instructions (Signed)
No further colon cancer screening or polyp surveillance testing. Please call if you have any concerning GI symptoms.

## 2019-04-11 ENCOUNTER — Inpatient Hospital Stay (HOSPITAL_COMMUNITY): Payer: Medicare Other

## 2019-04-11 ENCOUNTER — Inpatient Hospital Stay (HOSPITAL_COMMUNITY)
Admission: EM | Admit: 2019-04-11 | Discharge: 2019-04-16 | DRG: 481 | Disposition: A | Discharge status: Home Health | Payer: Medicare Other | Attending: Internal Medicine | Admitting: Internal Medicine

## 2019-04-11 ENCOUNTER — Other Ambulatory Visit: Payer: Self-pay

## 2019-04-11 ENCOUNTER — Encounter (HOSPITAL_COMMUNITY): Payer: Self-pay

## 2019-04-11 ENCOUNTER — Emergency Department (HOSPITAL_COMMUNITY): Payer: Medicare Other

## 2019-04-11 DIAGNOSIS — W1809XA Striking against other object with subsequent fall, initial encounter: Secondary | ICD-10-CM | POA: Diagnosis present

## 2019-04-11 DIAGNOSIS — Z90710 Acquired absence of both cervix and uterus: Secondary | ICD-10-CM

## 2019-04-11 DIAGNOSIS — N390 Urinary tract infection, site not specified: Secondary | ICD-10-CM | POA: Diagnosis not present

## 2019-04-11 DIAGNOSIS — E039 Hypothyroidism, unspecified: Secondary | ICD-10-CM | POA: Diagnosis present

## 2019-04-11 DIAGNOSIS — M1990 Unspecified osteoarthritis, unspecified site: Secondary | ICD-10-CM | POA: Diagnosis present

## 2019-04-11 DIAGNOSIS — S72142A Displaced intertrochanteric fracture of left femur, initial encounter for closed fracture: Principal | ICD-10-CM

## 2019-04-11 DIAGNOSIS — M810 Age-related osteoporosis without current pathological fracture: Secondary | ICD-10-CM | POA: Diagnosis present

## 2019-04-11 DIAGNOSIS — N3000 Acute cystitis without hematuria: Secondary | ICD-10-CM

## 2019-04-11 DIAGNOSIS — D649 Anemia, unspecified: Secondary | ICD-10-CM | POA: Diagnosis present

## 2019-04-11 DIAGNOSIS — B9620 Unspecified Escherichia coli [E. coli] as the cause of diseases classified elsewhere: Secondary | ICD-10-CM | POA: Diagnosis present

## 2019-04-11 DIAGNOSIS — Z1159 Encounter for screening for other viral diseases: Secondary | ICD-10-CM | POA: Diagnosis not present

## 2019-04-11 DIAGNOSIS — S72143A Displaced intertrochanteric fracture of unspecified femur, initial encounter for closed fracture: Secondary | ICD-10-CM

## 2019-04-11 DIAGNOSIS — W19XXXA Unspecified fall, initial encounter: Secondary | ICD-10-CM

## 2019-04-11 DIAGNOSIS — Z8542 Personal history of malignant neoplasm of other parts of uterus: Secondary | ICD-10-CM

## 2019-04-11 DIAGNOSIS — F419 Anxiety disorder, unspecified: Secondary | ICD-10-CM | POA: Diagnosis present

## 2019-04-11 DIAGNOSIS — Z79899 Other long term (current) drug therapy: Secondary | ICD-10-CM

## 2019-04-11 DIAGNOSIS — Z20822 Contact with and (suspected) exposure to covid-19: Secondary | ICD-10-CM

## 2019-04-11 DIAGNOSIS — E785 Hyperlipidemia, unspecified: Secondary | ICD-10-CM | POA: Diagnosis present

## 2019-04-11 DIAGNOSIS — I10 Essential (primary) hypertension: Secondary | ICD-10-CM | POA: Diagnosis present

## 2019-04-11 DIAGNOSIS — Z79890 Hormone replacement therapy (postmenopausal): Secondary | ICD-10-CM

## 2019-04-11 DIAGNOSIS — Z853 Personal history of malignant neoplasm of breast: Secondary | ICD-10-CM | POA: Diagnosis not present

## 2019-04-11 DIAGNOSIS — Z419 Encounter for procedure for purposes other than remedying health state, unspecified: Secondary | ICD-10-CM

## 2019-04-11 HISTORY — DX: Displaced intertrochanteric fracture of left femur, initial encounter for closed fracture: S72.142A

## 2019-04-11 LAB — CBC WITH DIFFERENTIAL/PLATELET
Abs Immature Granulocytes: 0.12 10*3/uL — ABNORMAL HIGH (ref 0.00–0.07)
Basophils Absolute: 0.1 10*3/uL (ref 0.0–0.1)
Basophils Relative: 1 %
Eosinophils Absolute: 0 10*3/uL (ref 0.0–0.5)
Eosinophils Relative: 0 %
HCT: 34.9 % — ABNORMAL LOW (ref 36.0–46.0)
Hemoglobin: 11.2 g/dL — ABNORMAL LOW (ref 12.0–15.0)
Immature Granulocytes: 1 %
Lymphocytes Relative: 10 %
Lymphs Abs: 1.3 10*3/uL (ref 0.7–4.0)
MCH: 32.2 pg (ref 26.0–34.0)
MCHC: 32.1 g/dL (ref 30.0–36.0)
MCV: 100.3 fL — ABNORMAL HIGH (ref 80.0–100.0)
Monocytes Absolute: 1 10*3/uL (ref 0.1–1.0)
Monocytes Relative: 8 %
Neutro Abs: 10 10*3/uL — ABNORMAL HIGH (ref 1.7–7.7)
Neutrophils Relative %: 80 %
Platelets: 348 10*3/uL (ref 150–400)
RBC: 3.48 MIL/uL — ABNORMAL LOW (ref 3.87–5.11)
RDW: 13.7 % (ref 11.5–15.5)
WBC: 12.4 10*3/uL — ABNORMAL HIGH (ref 4.0–10.5)
nRBC: 0 % (ref 0.0–0.2)

## 2019-04-11 LAB — URINALYSIS, ROUTINE W REFLEX MICROSCOPIC
Bilirubin Urine: NEGATIVE
Glucose, UA: NEGATIVE mg/dL
Hgb urine dipstick: NEGATIVE
Ketones, ur: 5 mg/dL — AB
Nitrite: POSITIVE — AB
Protein, ur: NEGATIVE mg/dL
Specific Gravity, Urine: 1.028 (ref 1.005–1.030)
pH: 5 (ref 5.0–8.0)

## 2019-04-11 LAB — BASIC METABOLIC PANEL
Anion gap: 9 (ref 5–15)
BUN: 25 mg/dL — ABNORMAL HIGH (ref 8–23)
CO2: 24 mmol/L (ref 22–32)
Calcium: 9.3 mg/dL (ref 8.9–10.3)
Chloride: 105 mmol/L (ref 98–111)
Creatinine, Ser: 0.76 mg/dL (ref 0.44–1.00)
GFR calc Af Amer: >60 mL/min (ref >60)
GFR calc non Af Amer: >60 mL/min (ref >60)
Glucose, Bld: 116 mg/dL — ABNORMAL HIGH (ref 70–99)
Potassium: 4.3 mmol/L (ref 3.5–5.1)
Sodium: 138 mmol/L (ref 135–145)

## 2019-04-11 LAB — PROTIME-INR
INR: 0.9 (ref 0.8–1.2)
Prothrombin Time: 12 seconds (ref 11.4–15.2)

## 2019-04-11 LAB — SURGICAL PCR SCREEN
MRSA, PCR: NEGATIVE
Staphylococcus aureus: NEGATIVE

## 2019-04-11 LAB — ABO/RH: ABO/RH(D): A POS

## 2019-04-11 LAB — SARS CORONAVIRUS 2 BY RT PCR (HOSPITAL ORDER, PERFORMED IN ~~LOC~~ HOSPITAL LAB): SARS Coronavirus 2: NEGATIVE

## 2019-04-11 MED ORDER — FENTANYL CITRATE (PF) 100 MCG/2ML IJ SOLN
50.0000 ug | INTRAMUSCULAR | Status: DC | PRN
  Administered 2019-04-11: 50 ug via INTRAVENOUS
  Filled 2019-04-11: qty 2

## 2019-04-11 MED ORDER — SIMVASTATIN 5 MG PO TABS
5.0000 mg | ORAL_TABLET | ORAL | Status: DC
  Administered 2019-04-12 – 2019-04-16 (×3): 5 mg via ORAL
  Filled 2019-04-11 (×3): qty 1

## 2019-04-11 MED ORDER — SODIUM CHLORIDE 0.9 % IV SOLN
INTRAVENOUS | Status: AC
  Administered 2019-04-11: 19:00:00 via INTRAVENOUS

## 2019-04-11 MED ORDER — MUPIROCIN 2 % EX OINT: 1.0000 "application " | TOPICAL_OINTMENT | Freq: Two times a day (BID) | CUTANEOUS | Status: DC

## 2019-04-11 MED ORDER — IRBESARTAN 300 MG PO TABS
300.0000 mg | ORAL_TABLET | Freq: Every day | ORAL | Status: DC
  Administered 2019-04-12 – 2019-04-16 (×5): 300 mg via ORAL
  Filled 2019-04-11 (×6): qty 1

## 2019-04-11 MED ORDER — AMLODIPINE BESYLATE 5 MG PO TABS
5.0000 mg | ORAL_TABLET | Freq: Every day | ORAL | Status: DC
  Administered 2019-04-12 – 2019-04-16 (×5): 5 mg via ORAL
  Filled 2019-04-11 (×6): qty 1

## 2019-04-11 MED ORDER — SODIUM CHLORIDE 0.9 % IV SOLN
1.0000 g | Freq: Once | INTRAVENOUS | Status: AC
  Administered 2019-04-11: 1 g via INTRAVENOUS
  Filled 2019-04-11: qty 10

## 2019-04-11 MED ORDER — ONDANSETRON HCL 4 MG/2ML IJ SOLN
4.0000 mg | Freq: Once | INTRAMUSCULAR | Status: AC
  Administered 2019-04-11: 4 mg via INTRAVENOUS
  Filled 2019-04-11: qty 2

## 2019-04-11 MED ORDER — SODIUM CHLORIDE 0.9 % IV SOLN
1.0000 g | INTRAVENOUS | Status: DC
  Administered 2019-04-12 – 2019-04-13 (×2): 1 g via INTRAVENOUS
  Filled 2019-04-11 (×2): qty 1

## 2019-04-11 MED ORDER — MUPIROCIN 2 % EX OINT
1.0000 "application " | TOPICAL_OINTMENT | Freq: Two times a day (BID) | CUTANEOUS | Status: DC
  Administered 2019-04-12: 1 via NASAL
  Filled 2019-04-11: qty 22

## 2019-04-11 MED ORDER — ONDANSETRON HCL 4 MG/2ML IJ SOLN
4.0000 mg | Freq: Four times a day (QID) | INTRAMUSCULAR | Status: DC | PRN
  Administered 2019-04-13: 4 mg via INTRAVENOUS

## 2019-04-11 MED ORDER — LEVOTHYROXINE SODIUM 75 MCG PO TABS
75.0000 ug | ORAL_TABLET | Freq: Every day | ORAL | Status: DC
  Administered 2019-04-12 – 2019-04-16 (×4): 75 ug via ORAL
  Filled 2019-04-11 (×4): qty 1

## 2019-04-11 MED ORDER — HYDROXYZINE HCL 10 MG PO TABS
10.0000 mg | ORAL_TABLET | Freq: Three times a day (TID) | ORAL | Status: DC | PRN
  Filled 2019-04-11: qty 1

## 2019-04-11 MED ORDER — HYDROMORPHONE HCL 1 MG/ML IJ SOLN
0.5000 mg | Freq: Four times a day (QID) | INTRAMUSCULAR | Status: DC | PRN
  Administered 2019-04-13: 0.5 mg via INTRAVENOUS
  Filled 2019-04-11: qty 0.5

## 2019-04-11 NOTE — ED Triage Notes (Signed)
Patient states she tripped over a drawer 6 days ago. Patient went to an Ortho clinic and was told she had a fractured left hip.

## 2019-04-11 NOTE — Progress Notes (Signed)
I have spoken to Dr. Gilford Raid in the emergency department about Cheryl Maldonado and her left hip fracture.  This is a patient who is known to my partner Dr. Lyla Glassing.  He will be operating tomorrow at [employer] [facility] and will plan to assume care in the morning.  She will need operative intervention.  Please keep n.p.o. and hold any chemical DVT prophylaxis beyond midnight tonight.  Full consult note to follow tomorrow morning.

## 2019-04-11 NOTE — ED Notes (Signed)
ED TO INPATIENT HANDOFF REPORT  Name/Age/Gender Cheryl Maldonado 85 y.o. female  Code Status Advance Directive Documentation     Most Recent Value  Type of Advance Directive  Healthcare Power of Attorney, Living will  Pre-existing out of facility DNR order (yellow form or pink MOST form)  -  "MOST" Form in Place?  -      Home/SNF/Other Home  Chief Complaint Broken Hip   Level of Care/Admitting Diagnosis ED Disposition    ED Disposition Condition [facility]: [place] [[id number]]  Level of Care: Telemetry [5]  Admit to tele based on following criteria: Other see comments  Comments: periop  Covid Evaluation: Confirmed COVID Negative  Diagnosis: Intertrochanteric fracture [facility]) [[id number]]  Admitting Physician: Jani Gravel [phone]  Attending Physician: Jani Gravel [phone]  Estimated length of stay: 5 - 7 days  Certification:: I certify this patient will need inpatient services for at least 2 midnights  PT Class (Do Not Modify): Inpatient [101]  PT Acc Code (Do Not Modify): Private [1]       Medical History Past Medical History:  Diagnosis Date  . Breast cancer ([place])   . Hypertension   . Uterine cancer ([place])     Allergies No Known Allergies  IV Location/Drains/Wounds Patient Lines/Drains/Airways Status   Active Line/Drains/Airways    Name:   Placement date:   Placement time:   Site:   Days:   Peripheral IV 04/11/19 Left Forearm   04/11/19    1832    Forearm   less than 1          Labs/Imaging Results for orders placed or performed during the hospital encounter of 04/11/19 (from the past 48 hour(s))  Urinalysis, Routine w reflex microscopic     Status: Abnormal   Collection Time: 04/11/19  5:51 PM  Result Value Ref Range   Color, Urine AMBER (A) YELLOW    Comment: BIOCHEMICALS MAY BE AFFECTED BY COLOR   APPearance HAZY (A) CLEAR   Specific Gravity, Urine 1.028 1.005 - 1.030   pH 5.0 5.0 - 8.0   Glucose, UA NEGATIVE NEGATIVE  mg/dL   Hgb urine dipstick NEGATIVE NEGATIVE   Bilirubin Urine NEGATIVE NEGATIVE   Ketones, ur 5 (A) NEGATIVE mg/dL   Protein, ur NEGATIVE NEGATIVE mg/dL   Nitrite POSITIVE (A) NEGATIVE   Leukocytes,Ua LARGE (A) NEGATIVE   RBC / HPF 0-5 0 - 5 RBC/hpf   WBC, UA 21-50 0 - 5 WBC/hpf   Bacteria, UA MANY (A) NONE SEEN   Squamous Epithelial / LPF 0-5 0 - 5   Ca Oxalate Crys, UA PRESENT    Non Squamous Epithelial 0-5 (A) NONE SEEN    Comment: Performed at [facility], [place] [address] [place], [place] [address]  SARS Coronavirus 2 (CEPHEID - Performed in [place] hospital lab), Hosp Order     Status: None   Collection Time: 04/11/19  6:03 PM  Result Value Ref Range   SARS Coronavirus 2 NEGATIVE NEGATIVE    Comment: (NOTE) If result is NEGATIVE SARS-CoV-2 target nucleic acids are NOT DETECTED. The SARS-CoV-2 RNA is generally detectable in upper and lower  respiratory specimens during the acute phase of infection. The lowest  concentration of SARS-CoV-2 viral copies this assay can detect is 250  copies / mL. A negative result does not preclude SARS-CoV-2 infection  and should not be used as the sole basis for treatment or other  patient management decisions.  A negative result may occur with  improper specimen collection / handling, submission of specimen other  than nasopharyngeal swab, presence of viral mutation(s) within the  areas targeted by this assay, and inadequate number of viral copies  (<250 copies / mL). A negative result must be combined with clinical  observations, patient history, and epidemiological information. If result is POSITIVE SARS-CoV-2 target nucleic acids are DETECTED. The SARS-CoV-2 RNA is generally detectable in upper and lower  respiratory specimens dur ing the acute phase of infection.  Positive  results are indicative of active infection with SARS-CoV-2.  Clinical  correlation with patient history and other diagnostic information is   necessary to determine patient infection status.  Positive results do  not rule out bacterial infection or co-infection with other viruses. If result is PRESUMPTIVE POSTIVE SARS-CoV-2 nucleic acids MAY BE PRESENT.   A presumptive positive result was obtained on the submitted specimen  and confirmed on repeat testing.  While 2019 novel coronavirus  (SARS-CoV-2) nucleic acids may be present in the submitted sample  additional confirmatory testing may be necessary for epidemiological  and / or clinical management purposes  to differentiate between  SARS-CoV-2 and other Sarbecovirus currently known to infect humans.  If clinically indicated additional testing with an alternate test  methodology [id number]) is advised. The SARS-CoV-2 RNA is generally  detectable in upper and lower respiratory sp ecimens during the acute  phase of infection. The expected result is Negative. Fact Sheet for Patients:  StrictlyIdeas.no Fact Sheet for Healthcare Providers: BankingDealers.co.za This test is not yet approved or cleared by the [place] FDA and has been authorized for detection and/or diagnosis of SARS-CoV-2 by FDA under an Emergency Use Authorization (EUA).  This EUA will remain in effect (meaning this test can be used) for the duration of the COVID-19 declaration under Section 564(b)(1) of the Act, 21 U.S.C. section 360bbb-3(b)(1), unless the authorization is terminated or revoked sooner. Performed at [facility], [place] [address], [place], [place] [address]   Basic metabolic panel     Status: Abnormal   Collection Time: 04/11/19  6:30 PM  Result Value Ref Range   Sodium 138 135 - 145 mmol/L   Potassium 4.3 3.5 - 5.1 mmol/L   Chloride 105 98 - 111 mmol/L   CO2 24 22 - 32 mmol/L   Glucose, Bld 116 (H) 70 - 99 mg/dL   BUN 25 (H) 8 - 23 mg/dL   Creatinine, Ser 0.76 0.44 - 1.00 mg/dL   Calcium 9.3 8.9 - 10.3 mg/dL   GFR calc  non Af Amer >60 >60 mL/min   GFR calc Af Amer >60 >60 mL/min   Anion gap 9 5 - 15    Comment: Performed at [facility], [place] [address] [place], [place] [address]  CBC WITH DIFFERENTIAL     Status: Abnormal   Collection Time: 04/11/19  6:30 PM  Result Value Ref Range   WBC 12.4 (H) 4.0 - 10.5 K/uL   RBC 3.48 (L) 3.87 - 5.11 MIL/uL   Hemoglobin 11.2 (L) 12.0 - 15.0 g/dL   HCT 34.9 (L) 36.0 - 46.0 %   MCV 100.3 (H) 80.0 - 100.0 fL   MCH 32.2 26.0 - 34.0 pg   MCHC 32.1 30.0 - 36.0 g/dL   RDW 13.7 11.5 - 15.5 %   Platelets 348 150 - 400 K/uL   nRBC 0.0 0.0 - 0.2 %   Neutrophils Relative % 80 %   Neutro Abs  10.0 (H) 1.7 - 7.7 K/uL   Lymphocytes Relative 10 %   Lymphs Abs 1.3 0.7 - 4.0 K/uL   Monocytes Relative 8 %   Monocytes Absolute 1.0 0.1 - 1.0 K/uL   Eosinophils Relative 0 %   Eosinophils Absolute 0.0 0.0 - 0.5 K/uL   Basophils Relative 1 %   Basophils Absolute 0.1 0.0 - 0.1 K/uL   Immature Granulocytes 1 %   Abs Immature Granulocytes 0.12 (H) 0.00 - 0.07 K/uL    Comment: Performed at [facility], [place] [address], [place], [place] [address]  Protime-INR     Status: None   Collection Time: 04/11/19  6:30 PM  Result Value Ref Range   Prothrombin Time 12.0 11.4 - 15.2 seconds   INR 0.9 0.8 - 1.2    Comment: (NOTE) INR goal varies based on device and disease states. Performed at [facility], [place] [address], [place], [place] [address]    Dg Chest 2 View  Result Date: 04/11/2019 CLINICAL DATA:  Fall 6 days ago EXAM: CHEST - 2 VIEW COMPARISON:  03/04/2010 FINDINGS: Postsurgical changes of left breast/chest. No acute airspace disease or effusion. Normal heart size. No pneumothorax. Mild scoliosis of the spine. IMPRESSION: No active cardiopulmonary disease. Electronically Signed   By: Donavan Foil M.D.   On: 04/11/2019 18:47   Dg Pelvis 1-2 Views  Result Date: 04/11/2019 CLINICAL DATA:  Fall with left hip pain EXAM:  PELVIS - 1-2 VIEW COMPARISON:  CT 03/02/2010 FINDINGS: Extensive surgical clips in the pelvis. Right femoral head projects in joint. Pubic symphysis and rami appear intact. Acute comminuted and displaced left intertrochanteric fracture. Left femoral head projects in joint. IMPRESSION: Acute comminuted and displaced left intertrochanteric fracture Electronically Signed   By: Donavan Foil M.D.   On: 04/11/2019 18:48   Dg Femur Min 2 Views Left  Result Date: 04/11/2019 CLINICAL DATA:  Fall with hip pain EXAM: LEFT FEMUR 2 VIEWS COMPARISON:  None. FINDINGS: Acute comminuted and displaced left intertrochanteric fracture. Left femoral head projects in joint. Mid to distal femur appear intact. IMPRESSION: Acute comminuted left intertrochanteric fracture Electronically Signed   By: Donavan Foil M.D.   On: 04/11/2019 18:49    Pending Labs Unresulted Labs (From admission, onward)    Start     Ordered   04/11/19 1843  Urine culture  ONCE - STAT,   STAT     04/11/19 1842   04/11/19 1712  Type and screen [place]  ONCE - STAT,   STAT    Comments:  [place]    04/11/19 1712          Vitals/Pain Today's Vitals   04/11/19 1714 04/11/19 1900 04/11/19 1906 04/11/19 1930  BP: (!) 156/60 (!) 133/58  131/64  Pulse: 96 89  92  Resp: (!) 22 18  (!) 27  Temp:      TempSrc:      SpO2: 97% 97%  99%  Weight:      Height:      PainSc:   2      Isolation Precautions No active isolations  Medications Medications  0.9 %  sodium chloride infusion ( Intravenous New Bag/Given 04/11/19 1833)  fentaNYL (SUBLIMAZE) injection 50 mcg (50 mcg Intravenous Given 04/11/19 1833)  ondansetron (ZOFRAN) injection 4 mg (4 mg Intravenous Given 04/11/19 1833)  cefTRIAXone (ROCEPHIN) 1 g in sodium chloride 0.9 % 100 mL IVPB (0 g Intravenous Stopped 04/11/19 2002)  Mobility walks  

## 2019-04-11 NOTE — ED Notes (Signed)
Pt transported to Xray. 

## 2019-04-11 NOTE — H&P (Addendum)
TRH H&P    Patient Demographics:    Cheryl Maldonado, is a 85 y.o. female  MRN: [id number]  DOB - 05-13-1933  Admit Date - 04/11/2019  Referring MD/NP/PA: Isla Pence  Outpatient Primary MD for the patient is Thressa Sheller, MD (Inactive) Thedora Hinders NP, [facility]  Patient coming from: home  Chief complaint-  L hip pain   HPI:    Cheryl Maldonado  is a 85 y.o. female,w hypertension, osteoporosis,  h/o breast/uterine cancer apparently presents with c/o mechanical fall about 1 week ago, tripped over drawer.  Pt noted left hip pain and it became severe enough where unable to walk.   In ED,  T98.3  P 92  R 16  Bp 131/64  Pox 99% on RA Wt 49.9 kg   L hip xray IMPRESSION: Acute comminuted and displaced left intertrochanteric fracture CXR IMPRESSION: No active cardiopulmonary disease.  Urinalysis wbc 21-50  Rbc 0-5  Na 138, K 4.3,  Bun 25, Creatinine 0.76 Wbc 12.4, Hgb 11.2, Plt 348  INR 1.0 Type and screen pending  Fentanyl 50 micrograms iv x1, Zofran 71m iv x1, Rocephin 1gm iv x1 given in ED.   ED spoke with RStann Mainlandfrom orthopedics.  Per ED, to OR tomorrow.   Pt will be admitted for mechanical fall with left comminuted, displaced L intertrochanteric fracture and UTI     Review of systems:    In addition to the HPI above,  No Fever-chills, No Headache, No changes with Vision or hearing, No problems swallowing food or Liquids, No Chest pain, Cough or Shortness of Breath, No Abdominal pain, No Nausea or Vomiting, bowel movements are regular, No Blood in stool or Urine, No dysuria, No new skin rashes or bruises,  No new weakness, tingling, numbness in any extremity, No recent weight gain or loss, No polyuria, polydypsia or polyphagia, No significant Mental Stressors.  All other systems reviewed and are negative.    Past History of the following :    Past  Medical History:  Diagnosis Date  . [place]   . Hypertension   . [place] 04[place]  . [facility]terine cancer ([facility]       Past Surgical History:  Procedure Laterality Date  . ABDOMINAL HYSTERECTOMY    . COLONOSCOPY        Social History:      Social History   Tobacco Use  . Smoking status: Never Smoker  . Smokeless tobacco: Never Used  Substance Use Topics  . Alcohol use: No    Alcohol/week: 0.0 standard drinks       Family History :     Family History  Problem Relation Age of Onset  . Cancer Mother        Home Medications:   Prior to Admission medications   Medication Sig Start Date End Date Taking? Authorizing Provider  amLODipine (NORVASC) 5 MG tablet Take 5 mg by mouth daily.   Yes [provider]  cholecalciferol (VITAMIN D3) 25 MCG (1000 UT) tablet Take  1,000 Units by mouth daily.   Yes [provider]  irbesartan (AVAPRO) 300 MG tablet Take 300 mg by mouth daily.   Yes [provider]  levothyroxine (SYNTHROID, LEVOTHROID) 75 MCG tablet Take 75 mcg by mouth daily before breakfast.   Yes [provider]  Multiple Vitamins-Minerals (MULTIVITAMIN ADULT) TABS Take 1 tablet by mouth daily.   Yes [provider]  raloxifene (EVISTA) 60 MG tablet Take 60 mg by mouth daily.   Yes [provider]  simvastatin (ZOCOR) 5 MG tablet Take 5 mg by mouth every other day.   Yes [provider]     Allergies:    No Known Allergies   Physical Exam:   Vitals  Blood pressure 131/64, pulse 92, temperature 98.3 F (36.8 C), temperature source Oral, resp. rate (!) 27, height '5\' 1"'  (1.549 m), weight 49.9 kg, SpO2 99 %.  1.  General: aoxox3  2. Psychiatric: euthymic  3. Neurologic: cn2-12 intact, reflexes 2+ symmetric, diffuse , no clonus, motor 5/5 in all 4 ext  4. HEENMT:  Anicteric, pupils 1.27m symmetric, direct, consensual, near intact  5. Respiratory :  CTAB  6. Cardiovascular : rrr s1, s2, no m/g/r    7. Gastrointestinal:  Abd: soft, nt, nd, +bs  8. Skin:  Ext: no c/c,    R leg edema (chronic for 40 years per pt,  No pain, no change),   No rash  9.Musculoskeletal:  Good ROM  No adenopathy    Data Review:    CBC Recent Labs  Lab 04/11/19 1830  WBC 12.4*  HGB 11.2*  HCT 34.9*  PLT 348  MCV 100.3*  MCH 32.2  MCHC 32.1  RDW 13.7  LYMPHSABS 1.3  MONOABS 1.0  EOSABS 0.0  BASOSABS 0.1   ------------------------------------------------------------------------------------------------------------------  Results for orders placed or performed during the hospital encounter of 04/11/19 (from the past 48 hour(s))  Urinalysis, Routine w reflex microscopic     Status: Abnormal   Collection Time: 04/11/19  5:51 PM  Result Value Ref Range   Color, Urine AMBER (A) YELLOW    Comment: BIOCHEMICALS MAY BE AFFECTED BY COLOR   APPearance HAZY (A) CLEAR   Specific Gravity, Urine 1.028 1.005 - 1.030   pH 5.0 5.0 - 8.0   Glucose, UA NEGATIVE NEGATIVE mg/dL   Hgb urine dipstick NEGATIVE NEGATIVE   Bilirubin Urine NEGATIVE NEGATIVE   Ketones, ur 5 (A) NEGATIVE mg/dL   Protein, ur NEGATIVE NEGATIVE mg/dL   Nitrite POSITIVE (A) NEGATIVE   Leukocytes,Ua LARGE (A) NEGATIVE   RBC / HPF 0-5 0 - 5 RBC/hpf   WBC, UA 21-50 0 - 5 WBC/hpf   Bacteria, UA MANY (A) NONE SEEN   Squamous Epithelial / LPF 0-5 0 - 5   Ca Oxalate Crys, UA PRESENT    Non Squamous Epithelial 0-5 (A) NONE SEEN    Comment: [facility] [address], [place] [address] SARS Coronavirus 2 (CEPHEID - [facility] Hosp Order     Status: None   Collection Time: 04/11/19  6:03 PM  Result Value Ref Range   SARS Coronavirus 2 NEGATIVE NEGATIVE    Comment: (NOTE) If result is NEGATIVE SARS-CoV-2 target nucleic acids are NOT DETECTED. The SARS-CoV-2 RNA is generally detectable in upper and lower   respiratory specimens during the acute phase of infection. The lowest  concentration of SARS-CoV-2 viral copies this assay can detect is 250  copies /  mL. A negative result does not preclude SARS-CoV-2 infection  and should not be used as the sole basis for treatment or other  patient management decisions.  A negative result may occur with  improper specimen collection / handling, submission of specimen other  than nasopharyngeal swab, presence of viral mutation(s) within the  areas targeted by this assay, and inadequate number of viral copies  (<250 copies / mL). A negative result must be combined with clinical  observations, patient history, and epidemiological information. If result is POSITIVE SARS-CoV-2 target nucleic acids are DETECTED. The SARS-CoV-2 RNA is generally detectable in upper and lower  respiratory specimens dur ing the acute phase of infection.  Positive  results are indicative of active infection with SARS-CoV-2.  Clinical  correlation with patient history and other diagnostic information is  necessary to determine patient infection status.  Positive results do  not rule out bacterial infection or co-infection with other viruses. If result is PRESUMPTIVE POSTIVE SARS-CoV-2 nucleic acids MAY BE PRESENT.   A presumptive positive result was obtained on the submitted specimen  and confirmed on repeat testing.  While 2019 novel coronavirus  (SARS-CoV-2) nucleic acids may be present in the submitted sample  additional confirmatory testing may be necessary for epidemiological  and / or clinical management purposes  to differentiate between  SARS-CoV-2 and other Sarbecovirus currently known to infect humans.  If clinically indicated additional testing with an alternate test  methodology [phone]) is advised. The SARS-CoV-2 RNA is generally  detectable in upper and lower respiratory sp ecimens during the acute  phase of infection. The expected result is Negative. Fact  Sheet for Patients:  StrictlyIdeas.no Fact Sheet for Healthcare Providers: BankingDealers.co.za This test is not yet approved or cleared by the [place] FDA and has been authorized for detection and/or diagnosis of SARS-CoV-2 by FDA under an Emergency Use Authorization (EUA).  This EUA will remain in effect (meaning this test can be used) for the duration of the COVID-19 declaration under Section 564(b)(1) of the Act, 21 U.S.C. section 360bbb-3(b)(1), unless the authorization is terminated or revoked sooner. Performed at [facility], [place] [address] [place], [place] [address]   Basic metabolic panel     Status: Abnormal   Collection Time: 04/11/19  6:30 PM  Result Value Ref Range   Sodium 138 135 - 145 mmol/L   Potassium 4.3 3.5 - 5.1 mmol/L   Chloride 105 98 - 111 mmol/L   CO2 24 22 - 32 mmol/L   Glucose, Bld 116 (H) 70 - 99 mg/dL   BUN 25 (H) 8 - 23 mg/dL   Creatinine, Ser 0.76 0.44 - 1.00 mg/dL   Calcium 9.3 8.9 - 10.3 mg/dL   GFR calc non Af Amer >60 >60 mL/min   GFR calc Af Amer >60 >60 mL/min   Anion gap 9 5 - 15    Comment: Performed at [facility], [place] [address] [place], [place] [address]  CBC WITH DIFFERENTIAL     Status: Abnormal   Collection Time: 04/11/19  6:30 PM  Result Value Ref Range   WBC 12.4 (H) 4.0 - 10.5 K/uL   RBC 3.48 (L) 3.87 - 5.11 MIL/uL   Hemoglobin 11.2 (L) 12.0 - 15.0 g/dL   HCT 34.9 (L) 36.0 - 46.0 %   MCV 100.3 (H) 80.0 - 100.0 fL   MCH 32.2 26.0 - 34.0 pg   MCHC 32.1 30.0 - 36.0 g/dL   RDW 13.7 11.5 - 15.5 %  Platelets 348 150 - 400 K/uL   nRBC 0.0 0.0 - 0.2 %   Neutrophils Relative % 80 %   Neutro Abs 10.0 (H) 1.7 - 7.7 K/uL   Lymphocytes Relative 10 %   Lymphs Abs 1.3 0.7 - 4.0 K/uL   Monocytes Relative 8 %   Monocytes Absolute 1.0 0.1 - 1.0 K/uL   Eosinophils Relative 0 %   Eosinophils Absolute 0.0 0.0 - 0.5 K/uL   Basophils Relative 1 %    Basophils Absolute 0.1 0.0 - 0.1 K/uL   Immature Granulocytes 1 %   Abs Immature Granulocytes 0.12 (H) 0.00 - 0.07 K/uL    Comment: Performed at [facility], [place] [address], [place], [place] [address]  Protime-INR     Status: None   Collection Time: 04/11/19  6:30 PM  Result Value Ref Range   Prothrombin Time 12.0 11.4 - 15.2 seconds   INR 0.9 0.8 - 1.2    Comment: (NOTE) INR goal varies based on device and disease states. Performed at [facility], [place] [address] [place], [place] [address]     Chemistries  Recent Labs  Lab 04/11/19 1830  NA 138  K 4.3  CL 105  CO2 24  GLUCOSE 116*  BUN 25*  CREATININE 0.76  CALCIUM 9.3   ------------------------------------------------------------------------------------------------------------------  ------------------------------------------------------------------------------------------------------------------ GFR: Estimated Creatinine Clearance: 40.2 mL/min (by C-G formula based on SCr of 0.76 mg/dL). Liver Function Tests: No results for input(s): AST, ALT, ALKPHOS, BILITOT, PROT, ALBUMIN in the last 168 hours. No results for input(s): LIPASE, AMYLASE in the last 168 hours. No results for input(s): AMMONIA in the last 168 hours. Coagulation Profile: Recent Labs  Lab 04/11/19 1830  INR 0.9   Cardiac Enzymes: No results for input(s): CKTOTAL, CKMB, CKMBINDEX, TROPONINI in the last 168 hours. BNP (last 3 results) No results for input(s): PROBNP in the last 8760 hours. HbA1C: No results for input(s): HGBA1C in the last 72 hours. CBG: No results for input(s): GLUCAP in the last 168 hours. Lipid Profile: No results for input(s): CHOL, HDL, LDLCALC, TRIG, CHOLHDL, LDLDIRECT in the last 72 hours. Thyroid Function Tests: No results for input(s): TSH, T4TOTAL, FREET4, T3FREE, THYROIDAB in the last 72 hours. Anemia Panel: No results for input(s): VITAMINB12, FOLATE, FERRITIN, TIBC, IRON,  RETICCTPCT in the last 72 hours.  --------------------------------------------------------------------------------------------------------------- Urine analysis:    Component Value Date/Time   COLORURINE AMBER (A) 04/11/2019 1751   APPEARANCEUR HAZY (A) 04/11/2019 1751   LABSPEC 1.028 04/11/2019 1751   PHURINE 5.0 04/11/2019 1751   GLUCOSEU NEGATIVE 04/11/2019 1751   HGBUR NEGATIVE 04/11/2019 1751   BILIRUBINUR NEGATIVE 04/11/2019 1751   KETONESUR 5 (A) 04/11/2019 1751   PROTEINUR NEGATIVE 04/11/2019 1751   UROBILINOGEN 0.2 03/01/2010 1859   NITRITE POSITIVE (A) 04/11/2019 1751   LEUKOCYTESUR LARGE (A) 04/11/2019 1751      Imaging Results:    Dg Chest 2 View  Result Date: 04/11/2019 CLINICAL DATA:  Fall 6 days ago EXAM: CHEST - 2 VIEW COMPARISON:  03/04/2010 FINDINGS: Postsurgical changes of left breast/chest. No acute airspace disease or effusion. Normal heart size. No pneumothorax. Mild scoliosis of the spine. IMPRESSION: No active cardiopulmonary disease. Electronically Signed   By: Donavan Foil M.D.   On: 04/11/2019 18:47   Dg Pelvis 1-2 Views  Result Date: 04/11/2019 CLINICAL DATA:  Fall with left hip pain EXAM: PELVIS - 1-2 VIEW COMPARISON:  CT 03/02/2010 FINDINGS: Extensive surgical clips in the pelvis. Right femoral head projects  in joint. Pubic symphysis and rami appear intact. Acute comminuted and displaced left intertrochanteric fracture. Left femoral head projects in joint. IMPRESSION: Acute comminuted and displaced left intertrochanteric fracture Electronically Signed   By: Donavan Foil M.D.   On: 04/11/2019 18:48   Dg Femur Min 2 Views Left  Result Date: 04/11/2019 CLINICAL DATA:  Fall with hip pain EXAM: LEFT FEMUR 2 VIEWS COMPARISON:  None. FINDINGS: Acute comminuted and displaced left intertrochanteric fracture. Left femoral head projects in joint. Mid to distal femur appear intact. IMPRESSION: Acute comminuted left intertrochanteric fracture Electronically Signed    By: Donavan Foil M.D.   On: 04/11/2019 18:49   Ekg: nsr at 95, nl axis, early R progression, no st-t changes c/w ischemia   Assessment & Plan:    Principal Problem:   Intertrochanteric fracture (HCC) Active Problems:   Acute lower UTI   Essential hypertension   Anemia  Comminuted, displaced L intertrochanteric fracture NPO after MN Dilaudid 0.68m iv q6h prn  Zofran 414miv q6h prn n/v Pt medically cleared to have surgery, benefit out weights the risk at this time.  [place] at [place] by self, [place] will probably need SNF Social work, PT/OT consults requested No Lovenox since to [place] by ED, pt to OR in AM  Acute lower UTI Urine culture Rocephin 1gm iv qday  Hypertension Cont Amlodipine 35m60mo qday Cont Irbesartan 300m61m qday  Hyperlipidemia Cont Zocor 35mg 2mqod  Hypothyroidism Cont Levothyroxine 75 micrograms po qday  H/o Osteoporosis Hold Evista for now  Anxiety Atarax 10mg 435mid prn   Anemia Check iron studies, b12, folate, TSH, ESR  Check cbc in am  DVT Prophylaxis-    SCDs , Defer to Orthopedics, regarding initiation of  DVT prophylaxis after surgery, Thanks  AM Labs Ordered, also please review Full Orders  Family Communication: Admission, patients condition and plan of care including tests being ordered have been discussed with the patient  who indicate understanding and agree with the plan and Code Status.  Code Status:  FULL CODE,  atttempted to contact son whose number is in computer. No response,  Left message [facility] if [place] questions.   Admission status: Inpatient: Based on patients clinical presentation and evaluation of above clinical data, I have made determination that patient meets Inpatient criteria at this time.  Pt will require surgery for left intertrochanteric fracture and will need > 2 nites stay and probably require placement to SNF  for rehab due to living alone.   Time spent in minutes : 70 minutes   Sebastion Jun Jani Graveln 04/11/2019 at 8:32 PM

## 2019-04-11 NOTE — ED Notes (Signed)
Pt has call bell within reach and knows to call staff if assistance is needed to the BR

## 2019-04-11 NOTE — ED Provider Notes (Signed)
[facility] DEPT Provider Note   CSN: [id number] Arrival date & time: 04/11/19  1627    History   Chief Complaint Chief Complaint  Patient presents with  . Fall  . Hip Injury    HPI Cheryl Maldonado is a 85 y.o. female.     Pt presents to the ED today with a left hip fx.  Pt said she tripped about 6 days ago.  She went to [facility] today and had xrays which showed a fractured left hip.  She was sent here.  Pt denies any other pain.  No sob or cp.  No f/c.     Past Medical History:  Diagnosis Date  . Breast cancer ([place])   . Hypertension   . Uterine cancer [facility])     Patient Active Problem List   Diagnosis Date Noted  . Intertrochanteric fracture ([place]) 04/11/2019    Past Surgical History:  Procedure Laterality Date  . ABDOMINAL HYSTERECTOMY       OB History   No obstetric history on file.      Home Medications    Prior to Admission medications   Medication Sig Start Date End Date Taking? Authorizing Provider  amLODipine (NORVASC) 5 MG tablet Take 5 mg by mouth daily.    [provider]  irbesartan (AVAPRO) 300 MG tablet Take 300 mg by mouth daily.    [provider]  levothyroxine (SYNTHROID, LEVOTHROID) 75 MCG tablet Take 75 mcg by mouth daily before breakfast.    [provider]  raloxifene (EVISTA) 60 MG tablet Take 60 mg by mouth daily.    [provider]  simvastatin (ZOCOR) 5 MG tablet Take 5 mg by mouth every other day.    [provider]    Family History History reviewed. No pertinent family history.  Social History Social History   Tobacco Use  . Smoking status: Never Smoker  . Smokeless tobacco: Never Used  Substance Use Topics  . Alcohol use: No    Alcohol/week: 0.0 standard drinks  . Drug use: Never     Allergies   Patient has no known allergies.   Review of Systems Review of Systems  Musculoskeletal:       Left hip pain     Physical Exam Updated  Vital Signs BP (!) 133/58 (BP Location: Left Arm)   Pulse 89   Temp 98.3 F (36.8 C) (Oral)   Resp 18   Ht 5\' 1"  (1.549 m)   Wt 49.9 kg   SpO2 97%   BMI 20.78 kg/m   Physical Exam Vitals signs and nursing note reviewed.  Constitutional:      Appearance: Normal appearance.  HENT:     Head: Normocephalic and atraumatic.     Right Ear: External ear normal.     Left Ear: External ear normal.     Nose: Nose normal.     Mouth/Throat:     Mouth: Mucous membranes are moist.     Pharynx: Oropharynx is clear.  Eyes:     Extraocular Movements: Extraocular movements intact.     Conjunctiva/sclera: Conjunctivae normal.     Pupils: Pupils are equal, round, and reactive to light.  Neck:     Musculoskeletal: Normal range of motion and neck supple.  Cardiovascular:     Rate and Rhythm: Normal rate and regular rhythm.     Pulses: Normal pulses.     Heart sounds: Normal heart sounds.  Pulmonary:     Effort: Pulmonary  effort is normal.     Breath sounds: Normal breath sounds.  Abdominal:     General: Abdomen is flat. Bowel sounds are normal.     Palpations: Abdomen is soft.  Musculoskeletal:     Left hip: She exhibits decreased range of motion and tenderness.     Comments: Swelling and bruising  Skin:    General: Skin is warm and dry.     Capillary Refill: Capillary refill takes less than 2 seconds.  Neurological:     General: No focal deficit present.     Mental Status: She is alert and oriented to person, place, and time.  Psychiatric:        Mood and Affect: Mood normal.        Behavior: Behavior normal.      ED Treatments / Results  Labs (all labs ordered are listed, but only abnormal results are displayed) Labs Reviewed  BASIC METABOLIC PANEL - Abnormal; Notable for the following components:      Result Value   Glucose, Bld 116 (*)    BUN 25 (*)    All other components within normal limits  CBC WITH DIFFERENTIAL/PLATELET - Abnormal; Notable for the following  components:   WBC 12.4 (*)    RBC 3.48 (*)    Hemoglobin 11.2 (*)    HCT 34.9 (*)    MCV 100.3 (*)    Neutro Abs 10.0 (*)    Abs Immature Granulocytes 0.12 (*)    All other components within normal limits  URINALYSIS, ROUTINE W REFLEX MICROSCOPIC - Abnormal; Notable for the following components:   Color, Urine AMBER (*)    APPearance HAZY (*)    Ketones, ur 5 (*)    Nitrite POSITIVE (*)    Leukocytes,Ua LARGE (*)    Bacteria, UA MANY (*)    Non Squamous Epithelial 0-5 (*)    All other components within normal limits  SARS CORONAVIRUS 2 (HOSPITAL ORDER, [facility])  URINE CULTURE  PROTIME-INR  TYPE AND SCREEN    EKG EKG Interpretation  Date/Time:  Tuesday April 11 2019 17:23:11 EDT Ventricular Rate:  96 PR Interval:    QRS Duration: 87 QT Interval:  342 QTC Calculation: 433 R Axis:   53 Text Interpretation:  Sinus rhythm Confirmed by Isla Pence [phone]) on 04/11/2019 5:35:51 PM   Radiology Dg Chest 2 View  Result Date: 04/11/2019 CLINICAL DATA:  Fall 6 days ago EXAM: CHEST - 2 VIEW COMPARISON:  03/04/2010 FINDINGS: Postsurgical changes of left breast/chest. No acute airspace disease or effusion. Normal heart size. No pneumothorax. Mild scoliosis of the spine. IMPRESSION: No active cardiopulmonary disease. Electronically Signed   By: Donavan Foil M.D.   On: 04/11/2019 18:47   Dg Pelvis 1-2 Views  Result Date: 04/11/2019 CLINICAL DATA:  Fall with left hip pain EXAM: PELVIS - 1-2 VIEW COMPARISON:  CT 03/02/2010 FINDINGS: Extensive surgical clips in the pelvis. Right femoral head projects in joint. Pubic symphysis and rami appear intact. Acute comminuted and displaced left intertrochanteric fracture. Left femoral head projects in joint. IMPRESSION: Acute comminuted and displaced left intertrochanteric fracture Electronically Signed   By: Donavan Foil M.D.   On: 04/11/2019 18:48   Dg Femur Min 2 Views Left  Result Date: 04/11/2019 CLINICAL DATA:   Fall with hip pain EXAM: LEFT FEMUR 2 VIEWS COMPARISON:  None. FINDINGS: Acute comminuted and displaced left intertrochanteric fracture. Left femoral head projects in joint. Mid to distal femur appear intact. IMPRESSION:  Acute comminuted left intertrochanteric fracture Electronically Signed   By: Donavan Foil M.D.   On: 04/11/2019 18:49    Procedures Procedures (including critical care time)  Medications Ordered in ED Medications  0.9 %  sodium chloride infusion ( Intravenous New Bag/Given 04/11/19 1833)  fentaNYL (SUBLIMAZE) injection 50 mcg (50 mcg Intravenous Given 04/11/19 1833)  cefTRIAXone (ROCEPHIN) 1 g in sodium chloride 0.9 % 100 mL IVPB (1 g Intravenous New Bag/Given 04/11/19 1905)  ondansetron (ZOFRAN) injection 4 mg (4 mg Intravenous Given 04/11/19 1833)     Initial Impression / Assessment and Plan / ED Course  I have reviewed the triage vital signs and the nursing notes.  Pertinent labs & imaging results that were available during my care of the patient were reviewed by me and considered in my medical decision making (see chart for details).      Pt does have a uti, so she was given a dose of rocephin in ED.  Pt's pain was controlled with fentanyl.  Covid swab negative.  Pt's left hip fx d/w Dr. Stann Mainland.  Dr. Lyla Glassing will likely operate in the am.  He requests a CT of the hip.  Pt d/w Dr. Maudie Mercury ([facility]) who will admit.  Final Clinical Impressions(s) / ED Diagnoses   Final diagnoses:  Fall, initial encounter  Closed displaced intertrochanteric fracture of left femur, initial encounter ([place])  Acute cystitis without hematuria  Covid-19 Virus not Detected    ED Discharge Orders    None       Isla Pence, MD 04/11/19 1928

## 2019-04-11 NOTE — ED Notes (Addendum)
Pt transported to CT ?

## 2019-04-12 LAB — CBC
HCT: 29.2 % — ABNORMAL LOW (ref 36.0–46.0)
Hemoglobin: 9.2 g/dL — ABNORMAL LOW (ref 12.0–15.0)
MCH: 31.8 pg (ref 26.0–34.0)
MCHC: 31.5 g/dL (ref 30.0–36.0)
MCV: 101 fL — ABNORMAL HIGH (ref 80.0–100.0)
Platelets: 336 10*3/uL (ref 150–400)
RBC: 2.89 MIL/uL — ABNORMAL LOW (ref 3.87–5.11)
RDW: 13.9 % (ref 11.5–15.5)
WBC: 7.6 10*3/uL (ref 4.0–10.5)
nRBC: 0 % (ref 0.0–0.2)

## 2019-04-12 LAB — COMPREHENSIVE METABOLIC PANEL
ALT: 17 U/L (ref 0–44)
AST: 21 U/L (ref 15–41)
Albumin: 3 g/dL — ABNORMAL LOW (ref 3.5–5.0)
Alkaline Phosphatase: 40 U/L (ref 38–126)
Anion gap: 9 (ref 5–15)
BUN: 21 mg/dL (ref 8–23)
CO2: 23 mmol/L (ref 22–32)
Calcium: 8.4 mg/dL — ABNORMAL LOW (ref 8.9–10.3)
Chloride: 106 mmol/L (ref 98–111)
Creatinine, Ser: 0.68 mg/dL (ref 0.44–1.00)
GFR calc Af Amer: >60 mL/min (ref >60)
GFR calc non Af Amer: >60 mL/min (ref >60)
Glucose, Bld: 96 mg/dL (ref 70–99)
Potassium: 3.8 mmol/L (ref 3.5–5.1)
Sodium: 138 mmol/L (ref 135–145)
Total Bilirubin: 0.6 mg/dL (ref 0.3–1.2)
Total Protein: 5.1 g/dL — ABNORMAL LOW (ref 6.5–8.1)

## 2019-04-12 LAB — TSH: TSH: 4.293 u[IU]/mL (ref 0.350–4.500)

## 2019-04-12 LAB — SEDIMENTATION RATE: Sed Rate: 26 mm/hr — ABNORMAL HIGH (ref 0–22)

## 2019-04-12 LAB — IRON AND TIBC
Iron: 53 ug/dL (ref 28–170)
Saturation Ratios: 23 % (ref 10.4–31.8)
TIBC: 231 ug/dL — ABNORMAL LOW (ref 250–450)
UIBC: 178 ug/dL

## 2019-04-12 LAB — VITAMIN B12: Vitamin B-12: 1467 pg/mL — ABNORMAL HIGH (ref 180–914)

## 2019-04-12 LAB — FERRITIN: Ferritin: 36 ng/mL (ref 11–307)

## 2019-04-12 MED ORDER — CHLORHEXIDINE GLUCONATE 4 % EX LIQD
60.0000 mL | Freq: Once | CUTANEOUS | Status: AC
  Administered 2019-04-12: 4 via TOPICAL
  Filled 2019-04-12: qty 60

## 2019-04-12 MED ORDER — ENSURE PRE-SURGERY PO LIQD
296.0000 mL | Freq: Once | ORAL | Status: DC
  Filled 2019-04-12 (×4): qty 296

## 2019-04-12 MED ORDER — SODIUM CHLORIDE 0.9 % IV SOLN
INTRAVENOUS | Status: DC
  Administered 2019-04-13: via INTRAVENOUS

## 2019-04-12 MED ORDER — POVIDONE-IODINE 10 % EX SWAB
2.0000 "application " | Freq: Once | CUTANEOUS | Status: AC
  Administered 2019-04-13: 2 via TOPICAL

## 2019-04-12 MED ORDER — CHLORHEXIDINE GLUCONATE 4 % EX LIQD
Freq: Once | CUTANEOUS | Status: AC
  Administered 2019-04-13: 01:00:00 via TOPICAL
  Filled 2019-04-12: qty 15

## 2019-04-12 MED ORDER — ENOXAPARIN SODIUM 40 MG/0.4ML ~~LOC~~ SOLN
40.0000 mg | Freq: Two times a day (BID) | SUBCUTANEOUS | Status: AC
  Administered 2019-04-12: 40 mg via SUBCUTANEOUS
  Filled 2019-04-12: qty 0.4

## 2019-04-12 NOTE — Progress Notes (Signed)
SPOKE W/  _answering machine, left message to call back     SCREENING SYMPTOMS OF COVID 19: incomplete   COUGH--  RUNNY NOSE---   SORE THROAT---  NASAL CONGESTION----  SNEEZING----  SHORTNESS OF BREATH---  DIFFICULTY BREATHING---  TEMP >100.0 -----  UNEXPLAINED BODY ACHES------  CHILLS --------   HEADACHES ---------  LOSS OF SMELL/ TASTE --------    HAVE YOU OR ANY FAMILY MEMBER TRAVELLED PAST 14 DAYS OUT OF THE   COUNTY--- STATE---- COUNTRY----  HAVE YOU OR ANY FAMILY MEMBER BEEN EXPOSED TO ANYONE WITH COVID 19?

## 2019-04-12 NOTE — Progress Notes (Signed)
PROGRESS NOTE    Cheryl Maldonado  KGY:[id number] DOB: 1933/01/08 DOA: 04/11/2019 PCP: Thressa Sheller, MD (Inactive)    Brief Narrative:   Cheryl Maldonado  is a 85 y.o. female,w hypertension, osteoporosis, h/o breast/uterine cancer apparently presents with c/o mechanical fall about 1 week ago, tripped over drawer.  Pt noted left hip pain and it became severe enough where unable to walk.   In ED, T98.3  P 92  R 16  Bp 131/64  Pox 99% on RA.  Left hip x-ray noted for acute comminuted displaced left intratrochanteric fracture.  Chest x-ray no active cardiopulmonary disease.  Urinalysis with large leukoesterase, positive nitrite, 25-50 WBCs. Na 138, K 4.3,  Bun 25, Creatinine 0.76. Wbc 12.4, Hgb 11.2, Plt 348.  INR 1.0.  Pt was admitted under the hospital service following mechanical fall with left intratrochanteric fracture and UTI.  Orthopedics plans on surgical intervention.   Assessment & Plan:   Principal Problem:   Intertrochanteric fracture (HCC) Active Problems:   Acute lower UTI   Essential hypertension   Anemia   Intertrochanteric fracture of left hip (HCC)  Comminuted, displaced L intertrochanteric fracture Patient presenting after a mechanical fall roughly 1 week prior to admission with progressive leg pain and inability to walk.  X-ray left femur notable for acute intertrochanteric fracture with displacement. --Orthopedics following, appreciate assistance --Plan surgical intervention on 04/13/2019, n.p.o. after midnight --Continue pain control with Dilaudid 0.5 mg IV every 6 hours as needed --PT OT, social work/case management for likely discharge needs  Acute cystitis without hematuria Urinalysis with positive nitrite, large leukoesterase and 25-50 WBCs. --Urine culture: Pending --Continue ceftriaxone  Essential hypertension --Continue amlodipine 5 mg p.o. daily, irbesartan 300 mg p.o. daily  Hyperlipidemia: Continue Zocor   Hypothyroidism: 1293.  Continue  levothyroxine 75 mcg p.o. daily  Anxiety: Continue Atarax 10 mg p.o. 3 times daily as needed  Anemia Hemoglobin 11.2 on admission with MCV of 100.3.  Iron 53, TIBC 231, ferritin 36.  Vitamin B12 1467. --Continue close monitoring following surgical intervention.   DVT prophylaxis: Holding chemical DVT prophylaxis in anticipation of surgery tomorrow Code Status: Full code Family Communication: None Disposition Plan: Continue inpatient, pending surgical intervention tomorrow, further depending on clinical course, likely SNF placement.  Consultants:   Orthopedics, Dr. Lyla Glassing  Procedures:   none  Antimicrobials:   Ceftriaxone 6/9>>   Subjective: Patient seen and examined at bedside, resting comfortably.  No complaints.  Pain is controlled.  Awaiting surgical intervention for left hip fracture.  Denies headache, no fever/chills/night sweats, no nausea/vomiting/diarrhea, no chest pain, no palpitations, no abdominal pain, no weakness, no paresthesias.  Objective: Vitals:   04/11/19 2112 04/12/19 0500 04/12/19 0542 04/12/19 1347  BP: 140/66  (!) 125/52 (!) 114/59  Pulse: 98  91 84  Resp: 20  18 18   Temp: 99.1 F (37.3 C)  98.2 F (36.8 C) 98.8 F (37.1 C)  TempSrc: Oral  Oral Oral  SpO2: 99%  98% 98%  Weight: 47.3 kg 48.3 kg    Height: 5\' 1"  (1.549 m)       Intake/Output Summary (Last 24 hours) at 04/12/2019 1637 Last data filed at 04/12/2019 1000 Gross per 24 hour  Intake 1373.49 ml  Output 25 ml  Net 1348.49 ml   Filed Weights   04/11/19 1640 04/11/19 2112 04/12/19 0500  Weight: 49.9 kg 47.3 kg 48.3 kg    Examination:  General exam: Appears calm and comfortable  Respiratory system: Clear to auscultation. Respiratory effort normal. Cardiovascular  system: S1 & S2 heard, RRR. No JVD, murmurs, rubs, gallops or clicks. No pedal edema. Gastrointestinal system: Abdomen is nondistended, soft and nontender. No organomegaly or masses felt. Normal bowel sounds  heard. Central nervous system: Alert and oriented. No focal neurological deficits. Extremities: Left hip with bruising and edema, mild pain on palpation Skin: No rashes, lesions or ulcers Psychiatry: Judgement and insight appear normal. Mood & affect appropriate.     Data Reviewed: I have personally reviewed following labs and imaging studies  CBC: Recent Labs  Lab 04/11/19 1830 04/12/19 0702  WBC 12.4* 7.6  NEUTROABS 10.0*  --   HGB 11.2* 9.2*  HCT 34.9* 29.2*  MCV 100.3* 101.0*  PLT 348 742   Basic Metabolic Panel: Recent Labs  Lab 04/11/19 1830 04/12/19 0702  NA 138 138  K 4.3 3.8  CL 105 106  CO2 24 23  GLUCOSE 116* 96  BUN 25* 21  CREATININE 0.76 0.68  CALCIUM 9.3 8.4*   GFR: Estimated Creatinine Clearance: 40.2 mL/min (by C-G formula based on SCr of 0.68 mg/dL). Liver Function Tests: Recent Labs  Lab 04/12/19 0702  AST 21  ALT 17  ALKPHOS 40  BILITOT 0.6  PROT 5.1*  ALBUMIN 3.0*   No results for input(s): LIPASE, AMYLASE in the last 168 hours. No results for input(s): AMMONIA in the last 168 hours. Coagulation Profile: Recent Labs  Lab 04/11/19 1830  INR 0.9   Cardiac Enzymes: No results for input(s): CKTOTAL, CKMB, CKMBINDEX, TROPONINI in the last 168 hours. BNP (last 3 results) No results for input(s): PROBNP in the last 8760 hours. HbA1C: No results for input(s): HGBA1C in the last 72 hours. CBG: No results for input(s): GLUCAP in the last 168 hours. Lipid Profile: No results for input(s): CHOL, HDL, LDLCALC, TRIG, CHOLHDL, LDLDIRECT in the last 72 hours. Thyroid Function Tests: Recent Labs    04/12/19 0702  TSH 4.293   Anemia Panel: Recent Labs    04/12/19 0702  VITAMINB12 1,467*  FERRITIN 36  TIBC 231*  IRON 53   Sepsis Labs: No results for input(s): PROCALCITON, LATICACIDVEN in the last 168 hours.  Recent Results (from the past 240 hour(s))  SARS Coronavirus 2 (CEPHEID - Performed in [place] hospital lab), Hosp  Order     Status: None   Collection Time: 04/11/19  6:03 PM  Result Value Ref Range Status   SARS Coronavirus 2 NEGATIVE NEGATIVE Final    Comment: (NOTE) If result is NEGATIVE SARS-CoV-2 target nucleic acids are NOT DETECTED. The SARS-CoV-2 RNA is generally detectable in upper and lower  respiratory specimens during the acute phase of infection. The lowest  concentration of SARS-CoV-2 viral copies this assay can detect is 250  copies / mL. A negative result does not preclude SARS-CoV-2 infection  and should not be used as the sole basis for treatment or other  patient management decisions.  A negative result may occur with  improper specimen collection / handling, submission of specimen other  than nasopharyngeal swab, presence of viral mutation(s) within the  areas targeted by this assay, and inadequate number of viral copies  (<250 copies / mL). A negative result must be combined with clinical  observations, patient history, and epidemiological information. If result is POSITIVE SARS-CoV-2 target nucleic acids are DETECTED. The SARS-CoV-2 RNA is generally detectable in upper and lower  respiratory specimens dur ing the acute phase of infection.  Positive  results are indicative of active infection with SARS-CoV-2.  Clinical  correlation  with patient history and other diagnostic information is  necessary to determine patient infection status.  Positive results do  not rule out bacterial infection or co-infection with other viruses. If result is PRESUMPTIVE POSTIVE SARS-CoV-2 nucleic acids MAY BE PRESENT.   A presumptive positive result was obtained on the submitted specimen  and confirmed on repeat testing.  While 2019 novel coronavirus  (SARS-CoV-2) nucleic acids may be present in the submitted sample  additional confirmatory testing may be necessary for epidemiological  and / or clinical management purposes  to differentiate between  SARS-CoV-2 and other Sarbecovirus currently  known to infect humans.  If clinically indicated additional testing with an alternate test  methodology [phone]) is advised. The SARS-CoV-2 RNA is generally  detectable in upper and lower respiratory sp ecimens during the acute  phase of infection. The expected result is Negative. Fact Sheet for Patients:  StrictlyIdeas.no Fact Sheet for Healthcare Providers: BankingDealers.co.za This test is not yet approved or cleared by the [place] FDA and has been authorized for detection and/or diagnosis of SARS-CoV-2 by FDA under an Emergency Use Authorization (EUA).  This EUA will remain in effect (meaning this test can be used) for the duration of the COVID-19 declaration under Section 564(b)(1) of the Act, 21 U.S.C. section 360bbb-3(b)(1), unless the authorization is terminated or revoked sooner. Performed at [facility], [place] [address], [place], [place] [address]   Surgical PCR screen     Status: None   Collection Time: 04/11/19  9:29 PM  Result Value Ref Range Status   MRSA, PCR NEGATIVE NEGATIVE Final   Staphylococcus aureus NEGATIVE NEGATIVE Final    Comment: (NOTE) The Xpert SA Assay (FDA approved for NASAL specimens in patients 69 years of age and older), is one component of a comprehensive surveillance program. It is not intended to diagnose infection nor to guide or monitor treatment. Performed at [facility], [place] [address], [place], [place] [address]          Radiology Studies: Dg Chest 2 View  Result Date: 04/11/2019 CLINICAL DATA:  Fall 6 days ago EXAM: CHEST - 2 VIEW COMPARISON:  03/04/2010 FINDINGS: Postsurgical changes of left breast/chest. No acute airspace disease or effusion. Normal heart size. No pneumothorax. Mild scoliosis of the spine. IMPRESSION: No active cardiopulmonary disease. Electronically Signed   By: Donavan Foil M.D.   On: 04/11/2019 18:47   Dg Pelvis 1-2  Views  Result Date: 04/11/2019 CLINICAL DATA:  Fall with left hip pain EXAM: PELVIS - 1-2 VIEW COMPARISON:  CT 03/02/2010 FINDINGS: Extensive surgical clips in the pelvis. Right femoral head projects in joint. Pubic symphysis and rami appear intact. Acute comminuted and displaced left intertrochanteric fracture. Left femoral head projects in joint. IMPRESSION: Acute comminuted and displaced left intertrochanteric fracture Electronically Signed   By: Donavan Foil M.D.   On: 04/11/2019 18:48   Ct Hip Left Wo Contrast  Result Date: 04/11/2019 CLINICAL DATA:  Left intertrochanteric femur fracture. EXAM: CT OF THE LEFT HIP WITHOUT CONTRAST TECHNIQUE: Multidetector CT imaging of the left hip was performed according to the standard protocol. Multiplanar CT image reconstructions were also generated. COMPARISON:  Pelvic x-rays from same day. CT abdomen pelvis dated Mar 02, 2010. FINDINGS: Bones/Joint/Cartilage Again seen is an acute comminuted, displaced, impacted, and angulated left intertrochanteric femur fracture. No additional fracture. No dislocation. The left hip joint space is relatively preserved. No joint effusion. Osteopenia. Ligaments Suboptimally assessed by CT. Muscles and Tendons Edematous and mildly enlarged  proximal adductor brevis muscle. Gluteus medius and minimus muscle atrophy. Soft tissues Small amount of hematoma surrounding the fracture. Left lateral hip superficial soft tissue swelling. No soft tissue mass or fluid collection. IMPRESSION: 1. Acute comminuted and displaced left intertrochanteric femur fracture. Electronically Signed   By: Titus Dubin M.D.   On: 04/11/2019 20:41   Dg Femur Min 2 Views Left  Result Date: 04/11/2019 CLINICAL DATA:  Fall with hip pain EXAM: LEFT FEMUR 2 VIEWS COMPARISON:  None. FINDINGS: Acute comminuted and displaced left intertrochanteric fracture. Left femoral head projects in joint. Mid to distal femur appear intact. IMPRESSION: Acute comminuted left  intertrochanteric fracture Electronically Signed   By: Donavan Foil M.D.   On: 04/11/2019 18:49        Scheduled Meds:  amLODipine  5 mg Oral Daily   [START ON 04/13/2019] chlorhexidine   Topical Once   feeding supplement  296 mL Oral Once   irbesartan  300 mg Oral Daily   levothyroxine  75 mcg Oral Q0600   mupirocin ointment  1 application Nasal BID   povidone-iodine  2 application Topical Once   simvastatin  5 mg Oral QODAY   Continuous Infusions:  cefTRIAXone (ROCEPHIN)  IV       LOS: 1 day    Time spent: 28 minutes    Keyna Blizard J [place] DO [facility] Pager [phone]  If 7PM-7AM, please contact night-coverage [email or web address] Password [email or web address] 04/12/2019, 4:37 PM

## 2019-04-12 NOTE — Progress Notes (Signed)
Attempted to call and update son on plan of care. Left voicemail to call back at his convenience. Will make patient aware.

## 2019-04-12 NOTE — H&P (View-Only) (Signed)
ORTHOPAEDIC CONSULTATION  REQUESTING PHYSICIAN: [place] Donnamarie Poag, DO  PCP:  Thressa Sheller, MD (Inactive)  Chief Complaint: Left hip fracture  HPI: Cheryl Maldonado is a 85 y.o. female who complains of left hip pain and inability to weight bear after a ground level fall about one week ago.  She presented to the office yesterday and was seen by Dr. Delilah Shan.  X-rays revealed a displaced left intertrochanteric fracture.  She was then sent to the hospital.  She was admitted by the hospitalist for perioperative medical risk ratification.  Orthopedic consultation was placed for management of the fracture.  She denies other injuries.  Past Medical History:  Diagnosis Date   Breast cancer ([place])    Hypertension    Intertrochanteric fracture of left hip ([place]) 04/11/2019   Uterine cancer (HCC)    Past Surgical History:  Procedure Laterality Date   ABDOMINAL HYSTERECTOMY     COLONOSCOPY     Social History   Socioeconomic History   Marital status: Married    Spouse name: Not on file   Number of children: 2   Years of education: Not on file   Highest education level: Not on file  Occupational History   Occupation: retired  Scientist, product/process development strain: Not on file   Food insecurity:    Worry: Not on file    Inability: Not on Lexicographer needs:    Medical: Not on file    Non-medical: Not on file  Tobacco Use   Smoking status: Never Smoker   Smokeless tobacco: Never Used  Substance and Sexual Activity   Alcohol use: No    Alcohol/week: 0.0 standard drinks   Drug use: Never   Sexual activity: Not on file  Lifestyle   Physical activity:    Days per week: Not on file    Minutes per session: Not on file   Stress: Not on file  Relationships   Social connections:    Talks on phone: Not on file    Gets together: Not on file    Attends religious service: Not on file    Active member of club or organization: Not on file    Attends  meetings of clubs or organizations: Not on file    Relationship status: Not on file  Other Topics Concern   Not on file  Social History Narrative   Not on file   Family History  Problem Relation Age of Onset   Cancer Mother    No Known Allergies Prior to Admission medications   Medication Sig Start Date End Date Taking? Authorizing Provider  amLODipine (NORVASC) 5 MG tablet Take 5 mg by mouth daily.   Yes [provider]  cholecalciferol (VITAMIN D3) 25 MCG (1000 UT) tablet Take 1,000 Units by mouth daily.   Yes [provider]  irbesartan (AVAPRO) 300 MG tablet Take 300 mg by mouth daily.   Yes [provider]  levothyroxine (SYNTHROID, LEVOTHROID) 75 MCG tablet Take 75 mcg by mouth daily before breakfast.   Yes [provider]  Multiple Vitamins-Minerals (MULTIVITAMIN ADULT) TABS Take 1 tablet by mouth daily.   Yes [provider]  raloxifene (EVISTA) 60 MG tablet Take 60 mg by mouth daily.   Yes [provider]  simvastatin (ZOCOR) 5 MG tablet Take 5 mg by mouth every other day.   Yes [provider]   Dg Chest 2 View  Result Date: 04/11/2019 CLINICAL DATA:  Fall 6 days  ago EXAM: CHEST - 2 VIEW COMPARISON:  03/04/2010 FINDINGS: Postsurgical changes of left breast/chest. No acute airspace disease or effusion. Normal heart size. No pneumothorax. Mild scoliosis of the spine. IMPRESSION: No active cardiopulmonary disease. Electronically Signed   By: Donavan Foil M.D.   On: 04/11/2019 18:47   Dg Pelvis 1-2 Views  Result Date: 04/11/2019 CLINICAL DATA:  Fall with left hip pain EXAM: PELVIS - 1-2 VIEW COMPARISON:  CT 03/02/2010 FINDINGS: Extensive surgical clips in the pelvis. Right femoral head projects in joint. Pubic symphysis and rami appear intact. Acute comminuted and displaced left intertrochanteric fracture. Left femoral head projects in joint. IMPRESSION: Acute comminuted and displaced left intertrochanteric fracture  Electronically Signed   By: Donavan Foil M.D.   On: 04/11/2019 18:48   Ct Hip Left Wo Contrast  Result Date: 04/11/2019 CLINICAL DATA:  Left intertrochanteric femur fracture. EXAM: CT OF THE LEFT HIP WITHOUT CONTRAST TECHNIQUE: Multidetector CT imaging of the left hip was performed according to the standard protocol. Multiplanar CT image reconstructions were also generated. COMPARISON:  Pelvic x-rays from same day. CT abdomen pelvis dated Mar 02, 2010. FINDINGS: Bones/Joint/Cartilage Again seen is an acute comminuted, displaced, impacted, and angulated left intertrochanteric femur fracture. No additional fracture. No dislocation. The left hip joint space is relatively preserved. No joint effusion. Osteopenia. Ligaments Suboptimally assessed by CT. Muscles and Tendons Edematous and mildly enlarged proximal adductor brevis muscle. Gluteus medius and minimus muscle atrophy. Soft tissues Small amount of hematoma surrounding the fracture. Left lateral hip superficial soft tissue swelling. No soft tissue mass or fluid collection. IMPRESSION: 1. Acute comminuted and displaced left intertrochanteric femur fracture. Electronically Signed   By: Titus Dubin M.D.   On: 04/11/2019 20:41   Dg Femur Min 2 Views Left  Result Date: 04/11/2019 CLINICAL DATA:  Fall with hip pain EXAM: LEFT FEMUR 2 VIEWS COMPARISON:  None. FINDINGS: Acute comminuted and displaced left intertrochanteric fracture. Left femoral head projects in joint. Mid to distal femur appear intact. IMPRESSION: Acute comminuted left intertrochanteric fracture Electronically Signed   By: Donavan Foil M.D.   On: 04/11/2019 18:49    Positive ROS: All other systems have been reviewed and were otherwise negative with the exception of those mentioned in the HPI and as above.  Physical Exam: General: Alert, no acute distress Cardiovascular: No pedal edema Respiratory: No cyanosis, no use of accessory musculature GI: No organomegaly, abdomen is soft and  non-tender Skin: No lesions in the area of chief complaint Neurologic: Sensation intact distally Psychiatric: Patient is competent for consent with normal mood and affect Lymphatic: No axillary or cervical lymphadenopathy  MUSCULOSKELETAL: Examination of the left hip reveals no skin wounds or lesions.  She is shortened and rotated.  She has pain with logrolling of the hip.  She is neurovascularly intact distally.  Assessment: Comminuted left intertrochanteric femur fracture, subacute.  Plan: I discussed the findings with the patient.  We discussed that the treatment for her comminuted fracture is an intramedullary nail.  There is a chance that she may develop nonunion of the femoral neck component of the fracture.  If this were to happen, she would then require conversion to total hip arthroplasty in the past.  Total hip replacement at this time not advisable due to extensive peritrochanteric fracture involvement.  She understands and wishes to proceed.  We will plan for surgery tomorrow.  She did receive Lovenox today.  Hold further chemical DVT prophylaxis.  N.p.o. after midnight.  The risks, benefits, and  alternatives were discussed with the patient. There are risks associated with the surgery including, but not limited to, problems with anesthesia (death), infection, differences in leg length/angulation/rotation, fracture of bones, loosening or failure of implants, malunion, nonunion, hematoma (blood accumulation) which may require surgical drainage, blood clots, pulmonary embolism, nerve injury (foot drop), and blood vessel injury. The patient understands these risks and elects to proceed   Bertram Savin, MD Cell [phone]    04/12/2019 4:56 PM

## 2019-04-12 NOTE — Consult Note (Signed)
ORTHOPAEDIC CONSULTATION  REQUESTING PHYSICIAN: [place] Donnamarie Poag, DO  PCP:  Thressa Sheller, MD (Inactive)  Chief Complaint: Left hip fracture  HPI: Cheryl Maldonado is a 85 y.o. female who complains of left hip pain and inability to weight bear after a ground level fall about one week ago.  She presented to the office yesterday and was seen by Dr. Delilah Shan.  X-rays revealed a displaced left intertrochanteric fracture.  She was then sent to the hospital.  She was admitted by the hospitalist for perioperative medical risk ratification.  Orthopedic consultation was placed for management of the fracture.  She denies other injuries.  Past Medical History:  Diagnosis Date   Breast cancer ([facility])    Hypertension    Intertrochanteric fracture of left hip ([facility]) 04/11/2019   Uterine cancer ([facility])    Past Surgical History:  Procedure Laterality Date   ABDOMINAL HYSTERECTOMY     COLONOSCOPY     Social History   Socioeconomic History   Marital status: Married    Spouse name: Not on file   Number of children: 2   Years of education: Not on file   Highest education level: Not on file  Occupational History   Occupation: retired  Scientist, product/process development strain: Not on file   Food insecurity:    Worry: Not on file    Inability: Not on Lexicographer needs:    Medical: Not on file    Non-medical: Not on file  Tobacco Use   Smoking status: Never Smoker   Smokeless tobacco: Never Used  Substance and Sexual Activity   Alcohol use: No    Alcohol/week: 0.0 standard drinks   Drug use: Never   Sexual activity: Not on file  Lifestyle   Physical activity:    Days per week: Not on file    Minutes per session: Not on file   Stress: Not on file  Relationships   Social connections:    Talks on phone: Not on file    Gets together: Not on file    Attends religious service: Not on file    Active member of club or organization: Not on file    Attends  meetings of clubs or organizations: Not on file    Relationship status: Not on file  Other Topics Concern   Not on file  Social History Narrative   Not on file   Family History  Problem Relation Age of Onset   Cancer Mother    No Known Allergies Prior to Admission medications   Medication Sig Start Date End Date Taking? Authorizing Provider  amLODipine (NORVASC) 5 MG tablet Take 5 mg by mouth daily.   Yes [provider]  cholecalciferol (VITAMIN D3) 25 MCG (1000 UT) tablet Take 1,000 Units by mouth daily.   Yes [provider]  irbesartan (AVAPRO) 300 MG tablet Take 300 mg by mouth daily.   Yes [provider]  levothyroxine (SYNTHROID, LEVOTHROID) 75 MCG tablet Take 75 mcg by mouth daily before breakfast.   Yes [provider]  Multiple Vitamins-Minerals (MULTIVITAMIN ADULT) TABS Take 1 tablet by mouth daily.   Yes [provider]  raloxifene (EVISTA) 60 MG tablet Take 60 mg by mouth daily.   Yes [provider]  simvastatin (ZOCOR) 5 MG tablet Take 5 mg by mouth every other day.   Yes [provider]   Dg Chest 2 View  Result Date: 04/11/2019 CLINICAL DATA:  Fall 6 days  ago EXAM: CHEST - 2 VIEW COMPARISON:  03/04/2010 FINDINGS: Postsurgical changes of left breast/chest. No acute airspace disease or effusion. Normal heart size. No pneumothorax. Mild scoliosis of the spine. IMPRESSION: No active cardiopulmonary disease. Electronically Signed   By: Donavan Foil M.D.   On: 04/11/2019 18:47   Dg Pelvis 1-2 Views  Result Date: 04/11/2019 CLINICAL DATA:  Fall with left hip pain EXAM: PELVIS - 1-2 VIEW COMPARISON:  CT 03/02/2010 FINDINGS: Extensive surgical clips in the pelvis. Right femoral head projects in joint. Pubic symphysis and rami appear intact. Acute comminuted and displaced left intertrochanteric fracture. Left femoral head projects in joint. IMPRESSION: Acute comminuted and displaced left intertrochanteric fracture  Electronically Signed   By: Donavan Foil M.D.   On: 04/11/2019 18:48   Ct Hip Left Wo Contrast  Result Date: 04/11/2019 CLINICAL DATA:  Left intertrochanteric femur fracture. EXAM: CT OF THE LEFT HIP WITHOUT CONTRAST TECHNIQUE: Multidetector CT imaging of the left hip was performed according to the standard protocol. Multiplanar CT image reconstructions were also generated. COMPARISON:  Pelvic x-rays from same day. CT abdomen pelvis dated Mar 02, 2010. FINDINGS: Bones/Joint/Cartilage Again seen is an acute comminuted, displaced, impacted, and angulated left intertrochanteric femur fracture. No additional fracture. No dislocation. The left hip joint space is relatively preserved. No joint effusion. Osteopenia. Ligaments Suboptimally assessed by CT. Muscles and Tendons Edematous and mildly enlarged proximal adductor brevis muscle. Gluteus medius and minimus muscle atrophy. Soft tissues Small amount of hematoma surrounding the fracture. Left lateral hip superficial soft tissue swelling. No soft tissue mass or fluid collection. IMPRESSION: 1. Acute comminuted and displaced left intertrochanteric femur fracture. Electronically Signed   By: Titus Dubin M.D.   On: 04/11/2019 20:41   Dg Femur Min 2 Views Left  Result Date: 04/11/2019 CLINICAL DATA:  Fall with hip pain EXAM: LEFT FEMUR 2 VIEWS COMPARISON:  None. FINDINGS: Acute comminuted and displaced left intertrochanteric fracture. Left femoral head projects in joint. Mid to distal femur appear intact. IMPRESSION: Acute comminuted left intertrochanteric fracture Electronically Signed   By: Donavan Foil M.D.   On: 04/11/2019 18:49    Positive ROS: All other systems have been reviewed and were otherwise negative with the exception of those mentioned in the HPI and as above.  Physical Exam: General: Alert, no acute distress Cardiovascular: No pedal edema Respiratory: No cyanosis, no use of accessory musculature GI: No organomegaly, abdomen is soft and  non-tender Skin: No lesions in the area of chief complaint Neurologic: Sensation intact distally Psychiatric: Patient is competent for consent with normal mood and affect Lymphatic: No axillary or cervical lymphadenopathy  MUSCULOSKELETAL: Examination of the left hip reveals no skin wounds or lesions.  She is shortened and rotated.  She has pain with logrolling of the hip.  She is neurovascularly intact distally.  Assessment: Comminuted left intertrochanteric femur fracture, subacute.  Plan: I discussed the findings with the patient.  We discussed that the treatment for her comminuted fracture is an intramedullary nail.  There is a chance that she may develop nonunion of the femoral neck component of the fracture.  If this were to happen, she would then require conversion to total hip arthroplasty in the past.  Total hip replacement at this time not advisable due to extensive peritrochanteric fracture involvement.  She understands and wishes to proceed.  We will plan for surgery tomorrow.  She did receive Lovenox today.  Hold further chemical DVT prophylaxis.  N.p.o. after midnight.  The risks, benefits, and  alternatives were discussed with the patient. There are risks associated with the surgery including, but not limited to, problems with anesthesia (death), infection, differences in leg length/angulation/rotation, fracture of bones, loosening or failure of implants, malunion, nonunion, hematoma (blood accumulation) which may require surgical drainage, blood clots, pulmonary embolism, nerve injury (foot drop), and blood vessel injury. The patient understands these risks and elects to proceed   Bertram Savin, MD Cell [phone]    04/12/2019 4:56 PM

## 2019-04-13 ENCOUNTER — Inpatient Hospital Stay (HOSPITAL_COMMUNITY): Payer: Medicare Other

## 2019-04-13 ENCOUNTER — Inpatient Hospital Stay (HOSPITAL_COMMUNITY): Payer: Medicare Other | Admitting: Anesthesiology

## 2019-04-13 ENCOUNTER — Encounter (HOSPITAL_COMMUNITY): Payer: Self-pay | Admitting: Anesthesiology

## 2019-04-13 ENCOUNTER — Encounter (HOSPITAL_COMMUNITY)
Admission: EM | Disposition: A | Discharge status: Home Health | Payer: Self-pay | Source: Home / Self Care | Attending: Internal Medicine

## 2019-04-13 DIAGNOSIS — S72142A Displaced intertrochanteric fracture of left femur, initial encounter for closed fracture: Secondary | ICD-10-CM | POA: Diagnosis present

## 2019-04-13 HISTORY — PX: FEMUR IM NAIL: SHX1597

## 2019-04-13 LAB — CBC
HCT: 28.4 % — ABNORMAL LOW (ref 36.0–46.0)
Hemoglobin: 8.6 g/dL — ABNORMAL LOW (ref 12.0–15.0)
MCH: 30.9 pg (ref 26.0–34.0)
MCHC: 30.3 g/dL (ref 30.0–36.0)
MCV: 102.2 fL — ABNORMAL HIGH (ref 80.0–100.0)
Platelets: 326 10*3/uL (ref 150–400)
RBC: 2.78 MIL/uL — ABNORMAL LOW (ref 3.87–5.11)
RDW: 13.9 % (ref 11.5–15.5)
WBC: 8.8 10*3/uL (ref 4.0–10.5)
nRBC: 0 % (ref 0.0–0.2)

## 2019-04-13 LAB — BASIC METABOLIC PANEL
Anion gap: 8 (ref 5–15)
BUN: 17 mg/dL (ref 8–23)
CO2: 23 mmol/L (ref 22–32)
Calcium: 8 mg/dL — ABNORMAL LOW (ref 8.9–10.3)
Chloride: 105 mmol/L (ref 98–111)
Creatinine, Ser: 0.69 mg/dL (ref 0.44–1.00)
GFR calc Af Amer: >60 mL/min (ref >60)
GFR calc non Af Amer: >60 mL/min (ref >60)
Glucose, Bld: 96 mg/dL (ref 70–99)
Potassium: 3.9 mmol/L (ref 3.5–5.1)
Sodium: 136 mmol/L (ref 135–145)

## 2019-04-13 LAB — FOLATE RBC
Folate, Hemolysate: 517 ng/mL
Folate, RBC: 1808 ng/mL (ref >498)
Hematocrit: 28.6 % — ABNORMAL LOW (ref 34.0–46.6)

## 2019-04-13 LAB — PREPARE RBC (CROSSMATCH)

## 2019-04-13 SURGERY — INSERTION, INTRAMEDULLARY ROD, FEMUR
Anesthesia: General | Laterality: Left

## 2019-04-13 MED ORDER — EPHEDRINE SULFATE-NACL 50-0.9 MG/10ML-% IV SOSY
PREFILLED_SYRINGE | INTRAVENOUS | Status: DC | PRN
  Administered 2019-04-13: 5 mg via INTRAVENOUS

## 2019-04-13 MED ORDER — ROCURONIUM BROMIDE 10 MG/ML (PF) SYRINGE
PREFILLED_SYRINGE | INTRAVENOUS | Status: DC | PRN
  Administered 2019-04-13: 40 mg via INTRAVENOUS

## 2019-04-13 MED ORDER — LACTATED RINGERS IV SOLN
INTRAVENOUS | Status: DC | PRN
  Administered 2019-04-13: 13:00:00 via INTRAVENOUS
  Administered 2019-04-13: 1000 mL via INTRAVENOUS

## 2019-04-13 MED ORDER — PHENOL 1.4 % MT LIQD
1.0000 | OROMUCOSAL | Status: DC | PRN
  Filled 2019-04-13: qty 177

## 2019-04-13 MED ORDER — SUGAMMADEX SODIUM 200 MG/2ML IV SOLN
INTRAVENOUS | Status: DC | PRN
  Administered 2019-04-13: 100 mg via INTRAVENOUS

## 2019-04-13 MED ORDER — HYDROCODONE-ACETAMINOPHEN 7.5-325 MG PO TABS: 1.0000 | ORAL_TABLET | ORAL | Status: DC | PRN

## 2019-04-13 MED ORDER — DEXAMETHASONE SODIUM PHOSPHATE 10 MG/ML IJ SOLN
INTRAMUSCULAR | Status: DC | PRN
  Administered 2019-04-13: 6 mg via INTRAVENOUS

## 2019-04-13 MED ORDER — MORPHINE SULFATE (PF) 4 MG/ML IV SOLN
0.5000 mg | INTRAVENOUS | Status: DC | PRN
  Administered 2019-04-13: 1 mg via INTRAVENOUS
  Filled 2019-04-13: qty 1

## 2019-04-13 MED ORDER — ISOPROPYL ALCOHOL 70 % SOLN
Status: DC | PRN
  Administered 2019-04-13 (×2): 1 via TOPICAL

## 2019-04-13 MED ORDER — TRANEXAMIC ACID-NACL 1000-0.7 MG/100ML-% IV SOLN
1000.0000 mg | INTRAVENOUS | Status: AC
  Administered 2019-04-13: 1000 mg via INTRAVENOUS
  Filled 2019-04-13: qty 100

## 2019-04-13 MED ORDER — FENTANYL CITRATE (PF) 100 MCG/2ML IJ SOLN
INTRAMUSCULAR | Status: AC
  Filled 2019-04-13: qty 2

## 2019-04-13 MED ORDER — MIDAZOLAM HCL 5 MG/5ML IJ SOLN
INTRAMUSCULAR | Status: DC | PRN
  Administered 2019-04-13: 0.5 mg via INTRAVENOUS

## 2019-04-13 MED ORDER — FENTANYL CITRATE (PF) 100 MCG/2ML IJ SOLN
25.0000 ug | INTRAMUSCULAR | Status: DC | PRN
  Administered 2019-04-13: 50 ug via INTRAVENOUS

## 2019-04-13 MED ORDER — SUGAMMADEX SODIUM 200 MG/2ML IV SOLN
INTRAVENOUS | Status: AC
  Filled 2019-04-13: qty 8

## 2019-04-13 MED ORDER — ONDANSETRON HCL 4 MG PO TABS: 4.0000 mg | ORAL_TABLET | Freq: Four times a day (QID) | ORAL | Status: DC | PRN

## 2019-04-13 MED ORDER — SENNA 8.6 MG PO TABS
1.0000 | ORAL_TABLET | Freq: Two times a day (BID) | ORAL | Status: DC
  Administered 2019-04-13 – 2019-04-16 (×6): 8.6 mg via ORAL
  Filled 2019-04-13 (×6): qty 1

## 2019-04-13 MED ORDER — ACETAMINOPHEN 325 MG PO TABS
325.0000 mg | ORAL_TABLET | Freq: Four times a day (QID) | ORAL | Status: DC | PRN
  Administered 2019-04-14: 650 mg via ORAL
  Filled 2019-04-13: qty 2

## 2019-04-13 MED ORDER — SODIUM CHLORIDE 0.9 % IV SOLN
INTRAVENOUS | Status: DC | PRN
  Administered 2019-04-13: 250 mL via INTRAVENOUS

## 2019-04-13 MED ORDER — ENOXAPARIN SODIUM 40 MG/0.4ML ~~LOC~~ SOLN
40.0000 mg | SUBCUTANEOUS | Status: DC
  Administered 2019-04-14 – 2019-04-16 (×3): 40 mg via SUBCUTANEOUS
  Filled 2019-04-13 (×3): qty 0.4

## 2019-04-13 MED ORDER — DOCUSATE SODIUM 100 MG PO CAPS
100.0000 mg | ORAL_CAPSULE | Freq: Two times a day (BID) | ORAL | Status: DC
  Administered 2019-04-13 – 2019-04-16 (×6): 100 mg via ORAL
  Filled 2019-04-13 (×6): qty 1

## 2019-04-13 MED ORDER — HYDROCODONE-ACETAMINOPHEN 5-325 MG PO TABS
1.0000 | ORAL_TABLET | ORAL | Status: DC | PRN
  Administered 2019-04-13 – 2019-04-16 (×4): 1 via ORAL
  Filled 2019-04-13 (×4): qty 1

## 2019-04-13 MED ORDER — SODIUM CHLORIDE 0.9% IV SOLUTION: Freq: Once | INTRAVENOUS | Status: DC

## 2019-04-13 MED ORDER — MIDAZOLAM HCL 2 MG/2ML IJ SOLN
INTRAMUSCULAR | Status: AC
  Filled 2019-04-13: qty 2

## 2019-04-13 MED ORDER — FENTANYL CITRATE (PF) 100 MCG/2ML IJ SOLN
INTRAMUSCULAR | Status: DC | PRN
  Administered 2019-04-13: 75 ug via INTRAVENOUS
  Administered 2019-04-13 (×2): 25 ug via INTRAVENOUS
  Administered 2019-04-13: 50 ug via INTRAVENOUS
  Administered 2019-04-13: 25 ug via INTRAVENOUS

## 2019-04-13 MED ORDER — PROPOFOL 10 MG/ML IV BOLUS
INTRAVENOUS | Status: AC
  Filled 2019-04-13: qty 60

## 2019-04-13 MED ORDER — LIDOCAINE 2% (20 MG/ML) 5 ML SYRINGE
INTRAMUSCULAR | Status: DC | PRN
  Administered 2019-04-13: 60 mg via INTRAVENOUS

## 2019-04-13 MED ORDER — CEFAZOLIN SODIUM-DEXTROSE 2-4 GM/100ML-% IV SOLN
2.0000 g | INTRAVENOUS | Status: AC
  Administered 2019-04-13: 14:00:00 2 g via INTRAVENOUS
  Filled 2019-04-13: qty 100

## 2019-04-13 MED ORDER — MENTHOL 3 MG MT LOZG
1.0000 | LOZENGE | OROMUCOSAL | Status: DC | PRN
  Filled 2019-04-13: qty 9

## 2019-04-13 MED ORDER — ONDANSETRON HCL 4 MG/2ML IJ SOLN: 4.0000 mg | Freq: Four times a day (QID) | INTRAMUSCULAR | Status: DC | PRN

## 2019-04-13 MED ORDER — EPHEDRINE 5 MG/ML INJ
INTRAVENOUS | Status: AC
  Filled 2019-04-13: qty 10

## 2019-04-13 MED ORDER — CEFAZOLIN SODIUM-DEXTROSE 2-4 GM/100ML-% IV SOLN
2.0000 g | Freq: Four times a day (QID) | INTRAVENOUS | Status: AC
  Administered 2019-04-13 – 2019-04-14 (×2): 2 g via INTRAVENOUS
  Filled 2019-04-13 (×3): qty 100

## 2019-04-13 MED ORDER — SODIUM CHLORIDE 0.9 % IR SOLN
Status: DC | PRN
  Administered 2019-04-13: 1000 mL

## 2019-04-13 MED ORDER — METOCLOPRAMIDE HCL 5 MG PO TABS: 5.0000 mg | ORAL_TABLET | Freq: Three times a day (TID) | ORAL | Status: DC | PRN

## 2019-04-13 MED ORDER — METOCLOPRAMIDE HCL 5 MG/ML IJ SOLN: 5.0000 mg | Freq: Three times a day (TID) | INTRAMUSCULAR | Status: DC | PRN

## 2019-04-13 MED ORDER — PROPOFOL 10 MG/ML IV BOLUS
INTRAVENOUS | Status: DC | PRN
  Administered 2019-04-13: 110 mg via INTRAVENOUS

## 2019-04-13 SURGICAL SUPPLY — 38 items
BAG ZIPLOCK 12X15 (MISCELLANEOUS) IMPLANT
BIT DRILL CANN LG 4.3MM (BIT) IMPLANT
CHLORAPREP W/TINT 26 (MISCELLANEOUS) ×2 IMPLANT
COVER PERINEAL POST (MISCELLANEOUS) ×2 IMPLANT
COVER SURGICAL LIGHT HANDLE (MISCELLANEOUS) ×2 IMPLANT
COVER WAND RF STERILE (DRAPES) IMPLANT
DERMABOND ADVANCED (GAUZE/BANDAGES/DRESSINGS) ×1
DERMABOND ADVANCED .7 DNX12 (GAUZE/BANDAGES/DRESSINGS) ×1 IMPLANT
DRAPE C-ARM 42X120 X-RAY (DRAPES) ×2 IMPLANT
DRAPE C-ARMOR (DRAPES) ×2 IMPLANT
DRAPE SHEET LG 3/4 BI-LAMINATE (DRAPES) ×2 IMPLANT
DRAPE STERI IOBAN 125X83 (DRAPES) ×2 IMPLANT
DRAPE U-SHAPE 47X51 STRL (DRAPES) ×4 IMPLANT
DRILL BIT CANN LG 4.3MM (BIT) ×2
DRSG MEPILEX BORDER 4X4 (GAUZE/BANDAGES/DRESSINGS) ×6 IMPLANT
ELECT BLADE TIP CTD 4 INCH (ELECTRODE) ×1 IMPLANT
FACESHIELD WRAPAROUND (MASK) ×4 IMPLANT
FACESHIELD WRAPAROUND OR TEAM (MASK) ×2 IMPLANT
GAUZE SPONGE 4X4 12PLY STRL (GAUZE/BANDAGES/DRESSINGS) ×2 IMPLANT
GLOVE BIO SURGEON STRL SZ8.5 (GLOVE) ×4 IMPLANT
GLOVE BIOGEL PI IND STRL 8.5 (GLOVE) ×1 IMPLANT
GLOVE BIOGEL PI INDICATOR 8.5 (GLOVE) ×1
GOWN SPEC L3 XXLG W/TWL (GOWN DISPOSABLE) ×2 IMPLANT
GUIDEPIN 3.2X17.5 THRD DISP (PIN) ×1 IMPLANT
HFN 125 DEG 11MM X 180MM (Orthopedic Implant) ×1 IMPLANT
HIP FRA NAIL LAG SCREW 10.5X90 (Orthopedic Implant) ×2 IMPLANT
KIT BASIN OR (CUSTOM PROCEDURE TRAY) ×2 IMPLANT
KIT TURNOVER KIT A (KITS) IMPLANT
MANIFOLD NEPTUNE II (INSTRUMENTS) ×2 IMPLANT
MARKER SKIN DUAL TIP RULER LAB (MISCELLANEOUS) ×2 IMPLANT
PACK TOTAL JOINT (CUSTOM PROCEDURE TRAY) ×2 IMPLANT
SCREW BONE CORTICAL 5.0X30 (Screw) ×1 IMPLANT
SCREW LAG HIP FRA NAIL 10.5X90 (Orthopedic Implant) IMPLANT
SUT MNCRL AB 3-0 PS2 18 (SUTURE) IMPLANT
SUT MON AB 2-0 CT1 36 (SUTURE) ×2 IMPLANT
TOWEL OR 17X26 10 PK STRL BLUE (TOWEL DISPOSABLE) ×2 IMPLANT
TOWEL OR NON WOVEN STRL DISP B (DISPOSABLE) ×2 IMPLANT
YANKAUER SUCT BULB TIP NO VENT (SUCTIONS) ×2 IMPLANT

## 2019-04-13 NOTE — Discharge Instructions (Signed)
 Dr. Lindsea Olivar Adult Hip & Knee Specialist [facility] [address] [place],  [address] [phone]   POSTOPERATIVE DIRECTIONS    Hip Rehabilitation, Guidelines Following Surgery   WEIGHT BEARING Weight bearing as tolerated with assist device (walker, cane, etc) as directed, use it as long as suggested by your surgeon or therapist, typically at least 4-6 weeks.   HOME CARE INSTRUCTIONS  Remove items at home which could result in a fall. This includes throw rugs or furniture in walking pathways.  Continue medications as instructed at time of discharge.  You may have some home medications which will be placed on hold until you complete the course of blood thinner medication.  4 days after discharge, you may start showering. No tub baths or soaking your incisions. Do not put on socks or shoes without following the instructions of your caregivers.   Sit on chairs with arms. Use the chair arms to help push yourself up when arising.  Arrange for the use of a toilet seat elevator so you are not sitting low.   Walk with walker as instructed.  You may resume a sexual relationship in one month or when given the OK by your caregiver.  Use walker as long as suggested by your caregivers.  Avoid periods of inactivity such as sitting longer than an hour when not asleep. This helps prevent blood clots.  You may return to work once you are cleared by your surgeon.  Do not drive a car for 6 weeks or until released by your surgeon.  Do not drive while taking narcotics.  Wear elastic stockings for two weeks following surgery during the day but you may remove then at night.  Make sure you keep all of your appointments after your operation with all of your doctors and caregivers. You should call the office at the above phone number and make an appointment for approximately two weeks after the date of your surgery. Please pick up a stool softener and laxative  for home use as long as you are requiring pain medications.  ICE to the affected hip every three hours for 30 minutes at a time and then as needed for pain and swelling. Continue to use ice on the hip for pain and swelling from surgery. You may notice swelling that will progress down to the foot and ankle.  This is normal after surgery.  Elevate the leg when you are not up walking on it.   It is important for you to complete the blood thinner medication as prescribed by your doctor.  Continue to use the breathing machine which will help keep your temperature down.  It is common for your temperature to cycle up and down following surgery, especially at night when you are not up moving around and exerting yourself.  The breathing machine keeps your lungs expanded and your temperature down.  RANGE OF MOTION AND STRENGTHENING EXERCISES  These exercises are designed to help you keep full movement of your hip joint. Follow your caregiver's or physical therapist's instructions. Perform all exercises about fifteen times, three times per day or as directed. Exercise both hips, even if you have had only one joint replacement. These exercises can be done on a training (exercise) mat, on the floor, on a table or on a bed. Use whatever works the best and is most comfortable for you. Use music or television while you are exercising so that the exercises are a pleasant break in your day. This   will make your life better with the exercises acting as a break in routine you can look forward to.  Lying on your back, slowly slide your foot toward your buttocks, raising your knee up off the floor. Then slowly slide your foot back down until your leg is straight again.  Lying on your back spread your legs as far apart as you can without causing discomfort.  Lying on your side, raise your upper leg and foot straight up from the floor as far as is comfortable. Slowly lower the leg and repeat.  Lying on your back, tighten up the  muscle in the front of your thigh (quadriceps muscles). You can do this by keeping your leg straight and trying to raise your heel off the floor. This helps strengthen the largest muscle supporting your knee.  Lying on your back, tighten up the muscles of your buttocks both with the legs straight and with the knee bent at a comfortable angle while keeping your heel on the floor.   SKILLED REHAB INSTRUCTIONS: If the patient is transferred to a skilled rehab facility following release from the hospital, a list of the current medications will be sent to the facility for the patient to continue.  When discharged from the skilled rehab facility, please have the facility set up the patient's Home Health Physical Therapy prior to being released. Also, the skilled facility will be responsible for providing the patient with their medications at time of release from the facility to include their pain medication and their blood thinner medication. If the patient is still at the rehab facility at time of the two week follow up appointment, the skilled rehab facility will also need to assist the patient in arranging follow up appointment in our office and any transportation needs.  MAKE SURE YOU:  Understand these instructions.  Will watch your condition.  Will get help right away if you are not doing well or get worse.  Pick up stool softner and laxative for home use following surgery while on pain medications. Daily dry dressing changes as needed. In 4 days, you may remove your dressings and begin taking showers - no tub baths or soaking the incisions. Continue to use ice for pain and swelling after surgery. Do not use any lotions or creams on the incision until instructed by your surgeon.   

## 2019-04-13 NOTE — Op Note (Signed)
OPERATIVE REPORT  SURGEON: Rod Can, MD   ASSISTANT: Staff.  PREOPERATIVE DIAGNOSIS: Comminuted Left intertrochanteric femur fracture.   POSTOPERATIVE DIAGNOSIS: Comminuted Left intertrochanteric femur fracture.   PROCEDURE: Intramedullary fixation, Left femur.   IMPLANTS: Biomet Affixus Hip Fracture Nail, 11 by 180 mm, 125 degrees. 10.5 x 90 mm Hip Fracture Nail Lag Screw. 5 x 30 mm distal interlocking screw 1.  ANESTHESIA:  Spinal  ESTIMATED BLOOD LOSS:-150 mL    ANTIBIOTICS: 2 g Ancef.  DRAINS: None.  COMPLICATIONS: None.   CONDITION: PACU - hemodynamically stable.   BRIEF CLINICAL NOTE: Cheryl Maldonado is a 85 y.o. female who presented with an intertrochanteric femur fracture. The patient was admitted to the hospitalist service and underwent perioperative risk stratification and medical optimization. The risks, benefits, and alternatives to the procedure were explained, and the patient elected to proceed.  PROCEDURE IN DETAIL: Surgical site was marked by myself. The patient was taken to the operating room and anesthesia was induced on the bed. The patient was then transferred to the [facility] table and the nonoperative lower extremity was scissored underneath the operative side. The fracture was reduced with traction, internal rotation, and adduction. The hip was prepped and draped in the normal sterile surgical fashion. Timeout was called verifying side and site of surgery. Preop antibiotics were given with 60 minutes of beginning the procedure.  Fluoroscopy was used to define the patient's anatomy. A 4 cm incision was made just proximal to the tip of the greater trochanter. The awl was used to obtain the standard starting point for a trochanteric entry nail under fluoroscopic control. The guidepin was placed. The entry reamer was used to open the proximal femur.  On the back table, the nail was assembled onto the jig. The nail was placed into the femur without any difficulty.  Through a separate stab incision, the cannula was placed down to the bone in preparation for the cephalomedullary device. A guidepin was placed into the femoral head using AP and lateral fluoroscopy views. The pin was measured, and then reaming was performed to the appropriate depth. The lag screw was inserted to the appropriate depth. The fracture was compressed through the jig. The setscrew was tightened and then loosened one quarter turn. A separate stab incision was created, and the distal interlocking screw was placed using standard AO technique. The jig was removed. Final AP and lateral fluoroscopy views were obtained to confirm fracture reduction and hardware placement. Tip apex distance was appropriate. There was no chondral penetration.  The wounds were copiously irrigated with saline. The wound was closed in layers with #1 Vicryl for the fascia, 2-0 Monocryl for the deep dermal layer, and 3-0 Monocryl subcuticular stitch. Glue was applied to the skin. Once the glue was fully hardened, sterile dressing was applied. The patient was then awakened from anesthesia and taken to the PACU in stable condition. Sponge needle and instrument counts were correct at the end of the case 2. There were no known complications.  We will readmit the patient to the hospitalist. Weightbearing status will be weightbearing as tolerated with a walker. We will begin Lovenox for DVT prophylaxis. The patient will work with physical therapy and undergo disposition planning.

## 2019-04-13 NOTE — Progress Notes (Signed)
OT Cancellation Note  Patient Details Name: Cheryl Maldonado MRN: [id number] DOB: [birth year]   Cancelled Treatment:    Reason Eval/Treat Not Completed: Other (comment).  Note, sx is planned for today. Will check back tomorrow.  Ronee Ranganathan 04/13/2019, 7:24 AM  Lesle Chris, OTR/L Acute Rehabilitation Services [phone] [facility] pager [phone] office 04/13/2019

## 2019-04-13 NOTE — Progress Notes (Signed)
PT Cancellation Note  Patient Details Name: Cheryl Maldonado MRN: [id number] DOB: [birth year]   Cancelled Treatment:    Reason Eval/Treat Not Completed: Medical issues which prohibited therapy Plan for surgery.  Will await surgery.  Please re-order.   Mansoor Hillyard,KATHrine E 04/13/2019, 8:11 AM Carmelia Bake, PT, DPT Acute Rehabilitation Services Office: [phone] Pager: [phone]

## 2019-04-13 NOTE — Interval H&P Note (Signed)
History and Physical Interval Note:  04/13/2019 2:09 PM  Cheryl Maldonado  has presented today for surgery, with the diagnosis of intertrochanteric fracture.  The various methods of treatment have been discussed with the patient and family. After consideration of risks, benefits and other options for treatment, the patient has consented to  Procedure(s): INTRAMEDULLARY (IM) NAIL FEMORAL (Left) as a surgical intervention.  The patient's history has been reviewed, patient examined, no change in status, stable for surgery.  I have reviewed the patient's chart and labs.  Questions were answered to the patient's satisfaction.    The risks, benefits, and alternatives were discussed with the patient. There are risks associated with the surgery including, but not limited to, problems with anesthesia (death), infection, differences in leg length/angulation/rotation, fracture of bones, loosening or failure of implants, malunion, nonunion, hematoma (blood accumulation) which may require surgical drainage, blood clots, pulmonary embolism, nerve injury (foot drop), and blood vessel injury. The patient understands these risks and elects to proceed.    Hilton Cork Maksym Pfiffner

## 2019-04-13 NOTE — Plan of Care (Signed)
Pt scheduled for surgery this afternoon. Denies pain at this time.

## 2019-04-13 NOTE — Anesthesia Postprocedure Evaluation (Signed)
Anesthesia Post Note  Patient: Cheryl Maldonado  Procedure(s) Performed: INTRAMEDULLARY (IM) NAIL FEMORAL (Left )     Patient location during evaluation: PACU Anesthesia Type: General Level of consciousness: sedated Pain management: pain level controlled Vital Signs Assessment: post-procedure vital signs reviewed and stable Respiratory status: spontaneous breathing and respiratory function stable Cardiovascular status: stable Postop Assessment: no apparent nausea or vomiting Anesthetic complications: no    Last Vitals:  Vitals:   04/13/19 1630 04/13/19 1645  BP: (!) 170/73 (!) 161/67  Pulse: 67 72  Resp: 12 15  Temp: (!) 35.8 C (!) 36.2 C  SpO2: 100% 100%    Last Pain:  Vitals:   04/13/19 1601  TempSrc:   PainSc: 0-No pain                 Ariyan Brisendine DANIEL

## 2019-04-13 NOTE — Anesthesia Preprocedure Evaluation (Addendum)
Anesthesia Evaluation  Patient identified by MRN, date of birth, ID band Patient awake    Reviewed: Allergy & Precautions, NPO status , Patient's Chart, lab work & pertinent test results  History of Anesthesia Complications Negative for: history of anesthetic complications  Airway Mallampati: I  TM Distance: >3 FB Neck ROM: Full    Dental  (+) Dental Advisory Given   Pulmonary neg pulmonary ROS,    breath sounds clear to auscultation       Cardiovascular hypertension, Pt. on medications (-) angina Rhythm:Regular Rate:Normal     Neuro/Psych    GI/Hepatic negative GI ROS, Neg liver ROS,   Endo/Other  Hypothyroidism   Renal/GU negative Renal ROS     Musculoskeletal  (+) Arthritis ,   Abdominal   Peds  Hematology  (+) Blood dyscrasia (Hb 8.6), ,   Anesthesia Other Findings H/o breast cancer H/o uterine cancer  Reproductive/Obstetrics                            Anesthesia Physical Anesthesia Plan  ASA: III  Anesthesia Plan: General   Post-op Pain Management:    Induction: Intravenous  PONV Risk Score and Plan: 4 or greater and Ondansetron, Dexamethasone and Treatment may vary due to age or medical condition  Airway Management Planned: Oral ETT  Additional Equipment:   Intra-op Plan:   Post-operative Plan: Extubation in OR  Informed Consent: I have reviewed the patients History and Physical, chart, labs and discussed the procedure including the risks, benefits and alternatives for the proposed anesthesia with the patient or authorized representative who has indicated his/her understanding and acceptance.     Dental advisory given  Plan Discussed with: CRNA and Surgeon  Anesthesia Plan Comments:         Anesthesia Quick Evaluation

## 2019-04-13 NOTE — Transfer of Care (Signed)
Immediate Anesthesia Transfer of Care Note  Patient: Cionna Kresse  Procedure(s) Performed: Procedure(s): INTRAMEDULLARY (IM) NAIL FEMORAL (Left)  Patient Location: PACU  Anesthesia Type:General  Level of Consciousness:  sedated, patient cooperative and responds to stimulation  Airway & Oxygen Therapy:Patient Spontanous Breathing and Patient connected to face mask oxgen  Post-op Assessment:  Report given to PACU RN and Post -op Vital signs reviewed and stable  Post vital signs:  Reviewed and stable  Last Vitals:  Vitals:   04/12/19 2256 04/13/19 0602  BP: (!) 119/56 (!) 124/55  Pulse: 83 90  Resp: 18 16  Temp: 37.2 C 36.9 C  SpO2: 46% 27%    Complications: No apparent anesthesia complications

## 2019-04-13 NOTE — Progress Notes (Signed)
PROGRESS NOTE    Cheryl Maldonado  PIR:[id number] DOB: 12/12/1932 DOA: 04/11/2019 PCP: Thressa Sheller, MD (Inactive)    Brief Narrative:   Cheryl Maldonado  is a 85 y.o. female,w hypertension, osteoporosis, h/o breast/uterine cancer apparently presents with c/o mechanical fall about 1 week ago, tripped over drawer.  Pt noted left hip pain and it became severe enough where unable to walk.   In ED, T98.3  P 92  R 16  Bp 131/64  Pox 99% on RA.  Left hip x-ray noted for acute comminuted displaced left intratrochanteric fracture.  Chest x-ray no active cardiopulmonary disease.  Urinalysis with large leukoesterase, positive nitrite, 25-50 WBCs. Na 138, K 4.3,  Bun 25, Creatinine 0.76. Wbc 12.4, Hgb 11.2, Plt 348.  INR 1.0.  Pt was admitted under the hospital service following mechanical fall with left intratrochanteric fracture and UTI.  Orthopedics plans on surgical intervention.   Assessment & Plan:   Principal Problem:   Intertrochanteric fracture (HCC) Active Problems:   Acute lower UTI   Essential hypertension   Anemia   Intertrochanteric fracture of left hip (HCC)  Comminuted, displaced L intertrochanteric fracture Patient presenting after a mechanical fall roughly 1 week prior to admission with progressive leg pain and inability to walk.  X-ray left femur notable for acute intertrochanteric fracture with displacement. --Orthopedics following, appreciate assistance --Plan surgical intervention today --Continue pain control with Dilaudid 0.5 mg IV every 6 hours as needed --PT OT, social work/case management for likely discharge needs  Acute cystitis without hematuria E. coli UTI Urinalysis with positive nitrite, large leukoesterase and 25-50 WBCs. --Urine culture: >100K E. coli, pending susceptibilities --Continue ceftriaxone  Essential hypertension --Continue amlodipine 5 mg p.o. daily, irbesartan 300 mg p.o. daily  Hyperlipidemia: Continue Zocor   Hypothyroidism: TSH 4.293.   Continue levothyroxine 75 mcg p.o. daily  Anxiety: Continue Atarax 10 mg p.o. 3 times daily as needed  Anemia Hemoglobin 11.2 on admission with MCV of 100.3.  Iron 53, TIBC 231, ferritin 36.  Vitamin B12 1467. --Hgb 11.2-->9.2-->8.6 --Continue close monitoring following surgical intervention --Transfuse for hemoglobin less than 7.0   DVT prophylaxis: Holding chemical DVT prophylaxis in anticipation of surgery tomorrow Code Status: Full code Family Communication: None Disposition Plan: Continue inpatient, pending surgical intervention tomorrow, further depending on clinical course, likely SNF placement.  Consultants:   Orthopedics, Dr. Lyla Glassing  Procedures:   none  Antimicrobials:   Ceftriaxone 6/9>>   Subjective: Patient seen and examined at bedside, resting comfortably.  No complaints.  Pain is controlled.  Awaiting surgical intervention for left hip fracture, plan for this afternoon.  Denies headache, no fever/chills/night sweats, no nausea/vomiting/diarrhea, no chest pain, no palpitations, no abdominal pain, no weakness, no paresthesias.  Objective: Vitals:   04/12/19 1347 04/12/19 2256 04/13/19 0602 04/13/19 0605  BP: (!) 114/59 (!) 119/56 (!) 124/55   Pulse: 84 83 90   Resp: 18 18 16    Temp: 98.8 F (37.1 C) 99 F (37.2 C) 98.5 F (36.9 C)   TempSrc: Oral Oral Oral   SpO2: 98% 96% 96%   Weight:    50 kg  Height:        Intake/Output Summary (Last 24 hours) at 04/13/2019 1144 Last data filed at 04/13/2019 1000 Gross per 24 hour  Intake 1307.83 ml  Output 375 ml  Net 932.83 ml   Filed Weights   04/11/19 2112 04/12/19 0500 04/13/19 0605  Weight: 47.3 kg 48.3 kg 50 kg    Examination:  General exam: Appears  calm and comfortable  Respiratory system: Clear to auscultation. Respiratory effort normal. Cardiovascular system: S1 & S2 heard, RRR. No JVD, murmurs, rubs, gallops or clicks. No pedal edema. Gastrointestinal system: Abdomen is nondistended, soft and  nontender. No organomegaly or masses felt. Normal bowel sounds heard. Central nervous system: Alert and oriented. No focal neurological deficits. Extremities: Left hip with bruising and edema, mild pain on palpation Skin: No rashes, lesions or ulcers Psychiatry: Judgement and insight appear normal. Mood & affect appropriate.     Data Reviewed: I have personally reviewed following labs and imaging studies  CBC: Recent Labs  Lab 04/11/19 1830 04/12/19 0702 04/13/19 0811  WBC 12.4* 7.6 8.8  NEUTROABS 10.0*  --   --   HGB 11.2* 9.2* 8.6*  HCT 34.9* 29.2* 28.4*  MCV 100.3* 101.0* 102.2*  PLT 348 336 578   Basic Metabolic Panel: Recent Labs  Lab 04/11/19 1830 04/12/19 0702 04/13/19 0811  NA 138 138 136  K 4.3 3.8 3.9  CL 105 106 105  CO2 24 23 23   GLUCOSE 116* 96 96  BUN 25* 21 17  CREATININE 0.76 0.68 0.69  CALCIUM 9.3 8.4* 8.0*   GFR: Estimated Creatinine Clearance: 40.2 mL/min (by C-G formula based on SCr of 0.69 mg/dL). Liver Function Tests: Recent Labs  Lab 04/12/19 0702  AST 21  ALT 17  ALKPHOS 40  BILITOT 0.6  PROT 5.1*  ALBUMIN 3.0*   No results for input(s): LIPASE, AMYLASE in the last 168 hours. No results for input(s): AMMONIA in the last 168 hours. Coagulation Profile: Recent Labs  Lab 04/11/19 1830  INR 0.9   Cardiac Enzymes: No results for input(s): CKTOTAL, CKMB, CKMBINDEX, TROPONINI in the last 168 hours. BNP (last 3 results) No results for input(s): PROBNP in the last 8760 hours. HbA1C: No results for input(s): HGBA1C in the last 72 hours. CBG: No results for input(s): GLUCAP in the last 168 hours. Lipid Profile: No results for input(s): CHOL, HDL, LDLCALC, TRIG, CHOLHDL, LDLDIRECT in the last 72 hours. Thyroid Function Tests: Recent Labs    04/12/19 0702  TSH 4.293   Anemia Panel: Recent Labs    04/12/19 0702  VITAMINB12 1,467*  FERRITIN 36  TIBC 231*  IRON 53   Sepsis Labs: No results for input(s): PROCALCITON,  LATICACIDVEN in the last 168 hours.  Recent Results (from the past 240 hour(s))  Urine culture     Status: Abnormal (Preliminary result)   Collection Time: 04/11/19  5:51 PM   Specimen: Urine, Clean Catch  Result Value Ref Range Status   Specimen Description   Final    URINE, CLEAN CATCH Performed at [facility], [place] [address] [place], [place] [address]    Special Requests   Final    NONE Performed at [facility], [place] [address] [place], [place] [address]    Culture (A)  Final    >=100,000 COLONIES/mL ESCHERICHIA COLI SUSCEPTIBILITIES TO FOLLOW Performed at [facility], [place] [address] [place], [place] [address]    Report Status PENDING  Incomplete  SARS Coronavirus 2 (CEPHEID - Performed in [place] hospital lab), Hosp Order     Status: None   Collection Time: 04/11/19  6:03 PM   Specimen: Nasopharyngeal Swab  Result Value Ref Range Status   SARS Coronavirus 2 NEGATIVE NEGATIVE Final    Comment: (NOTE) If result is NEGATIVE SARS-CoV-2 target nucleic acids are NOT DETECTED. The SARS-CoV-2 RNA is generally detectable in upper and  lower  respiratory specimens during the acute phase of infection. The lowest  concentration of SARS-CoV-2 viral copies this assay can detect is 250  copies / mL. A negative result does not preclude SARS-CoV-2 infection  and should not be used as the sole basis for treatment or other  patient management decisions.  A negative result may occur with  improper specimen collection / handling, submission of specimen other  than nasopharyngeal swab, presence of viral mutation(s) within the  areas targeted by this assay, and inadequate number of viral copies  (<250 copies / mL). A negative result must be combined with clinical  observations, patient history, and epidemiological information. If result is POSITIVE SARS-CoV-2 target nucleic acids are DETECTED. The SARS-CoV-2 RNA is generally detectable in  upper and lower  respiratory specimens dur ing the acute phase of infection.  Positive  results are indicative of active infection with SARS-CoV-2.  Clinical  correlation with patient history and other diagnostic information is  necessary to determine patient infection status.  Positive results do  not rule out bacterial infection or co-infection with other viruses. If result is PRESUMPTIVE POSTIVE SARS-CoV-2 nucleic acids MAY BE PRESENT.   A presumptive positive result was obtained on the submitted specimen  and confirmed on repeat testing.  While 2019 novel coronavirus  (SARS-CoV-2) nucleic acids may be present in the submitted sample  additional confirmatory testing may be necessary for epidemiological  and / or clinical management purposes  to differentiate between  SARS-CoV-2 and other Sarbecovirus currently known to infect humans.  If clinically indicated additional testing with an alternate test  methodology [phone]) is advised. The SARS-CoV-2 RNA is generally  detectable in upper and lower respiratory sp ecimens during the acute  phase of infection. The expected result is Negative. Fact Sheet for Patients:  StrictlyIdeas.no Fact Sheet for Healthcare Providers: BankingDealers.co.za This test is not yet approved or cleared by the [place] FDA and has been authorized for detection and/or diagnosis of SARS-CoV-2 by FDA under an Emergency Use Authorization (EUA).  This EUA will remain in effect (meaning this test can be used) for the duration of the COVID-19 declaration under Section 564(b)(1) of the Act, 21 U.S.C. section 360bbb-3(b)(1), unless the authorization is terminated or revoked sooner. Performed at [facility], [place] [address], [place], [place] [address]   Surgical PCR screen     Status: None   Collection Time: 04/11/19  9:29 PM   Specimen: Nasal Mucosa; Nasal Swab  Result Value Ref Range Status     MRSA, PCR NEGATIVE NEGATIVE Final   Staphylococcus aureus NEGATIVE NEGATIVE Final    Comment: (NOTE) The Xpert SA Assay (FDA approved for NASAL specimens in patients 5 years of age and older), is one component of a comprehensive surveillance program. It is not intended to diagnose infection nor to guide or monitor treatment. Performed at [facility], [place] [address], [place], [place] [address]          Radiology Studies: Dg Chest 2 View  Result Date: 04/11/2019 CLINICAL DATA:  Fall 6 days ago EXAM: CHEST - 2 VIEW COMPARISON:  03/04/2010 FINDINGS: Postsurgical changes of left breast/chest. No acute airspace disease or effusion. Normal heart size. No pneumothorax. Mild scoliosis of the spine. IMPRESSION: No active cardiopulmonary disease. Electronically Signed   By: Donavan Foil M.D.   On: 04/11/2019 18:47   Dg Pelvis 1-2 Views  Result Date: 04/11/2019 CLINICAL DATA:  Fall with left hip pain EXAM: PELVIS - 1-2  VIEW COMPARISON:  CT 03/02/2010 FINDINGS: Extensive surgical clips in the pelvis. Right femoral head projects in joint. Pubic symphysis and rami appear intact. Acute comminuted and displaced left intertrochanteric fracture. Left femoral head projects in joint. IMPRESSION: Acute comminuted and displaced left intertrochanteric fracture Electronically Signed   By: Donavan Foil M.D.   On: 04/11/2019 18:48   Ct Hip Left Wo Contrast  Result Date: 04/11/2019 CLINICAL DATA:  Left intertrochanteric femur fracture. EXAM: CT OF THE LEFT HIP WITHOUT CONTRAST TECHNIQUE: Multidetector CT imaging of the left hip was performed according to the standard protocol. Multiplanar CT image reconstructions were also generated. COMPARISON:  Pelvic x-rays from same day. CT abdomen pelvis dated Mar 02, 2010. FINDINGS: Bones/Joint/Cartilage Again seen is an acute comminuted, displaced, impacted, and angulated left intertrochanteric femur fracture. No additional fracture. No dislocation. The  left hip joint space is relatively preserved. No joint effusion. Osteopenia. Ligaments Suboptimally assessed by CT. Muscles and Tendons Edematous and mildly enlarged proximal adductor brevis muscle. Gluteus medius and minimus muscle atrophy. Soft tissues Small amount of hematoma surrounding the fracture. Left lateral hip superficial soft tissue swelling. No soft tissue mass or fluid collection. IMPRESSION: 1. Acute comminuted and displaced left intertrochanteric femur fracture. Electronically Signed   By: Titus Dubin M.D.   On: 04/11/2019 20:41   Dg Femur Min 2 Views Left  Result Date: 04/11/2019 CLINICAL DATA:  Fall with hip pain EXAM: LEFT FEMUR 2 VIEWS COMPARISON:  None. FINDINGS: Acute comminuted and displaced left intertrochanteric fracture. Left femoral head projects in joint. Mid to distal femur appear intact. IMPRESSION: Acute comminuted left intertrochanteric fracture Electronically Signed   By: Donavan Foil M.D.   On: 04/11/2019 18:49        Scheduled Meds:  amLODipine  5 mg Oral Daily   feeding supplement  296 mL Oral Once   irbesartan  300 mg Oral Daily   levothyroxine  75 mcg Oral Q0600   povidone-iodine  2 application Topical Once   simvastatin  5 mg Oral QODAY   Continuous Infusions:  sodium chloride 75 mL/hr at 04/13/19 0026   cefTRIAXone (ROCEPHIN)  IV 1 g (04/12/19 1704)     LOS: 2 days    Time spent: 28 minutes    Gulianna Hornsby J [place] DO [facility] Pager [phone]  If 7PM-7AM, please contact night-coverage [email or web address] Password [facility] 04/13/2019, 11:44 AM

## 2019-04-13 NOTE — Anesthesia Procedure Notes (Signed)
Procedure Name: Intubation Date/Time: 04/13/2019 2:31 PM Performed by: Lavina Hamman, CRNA Pre-anesthesia Checklist: Patient identified, Emergency Drugs available, Suction available, Patient being monitored and Timeout performed Patient Re-evaluated:Patient Re-evaluated prior to induction Oxygen Delivery Method: Circle system utilized Preoxygenation: Pre-oxygenation with 100% oxygen Induction Type: IV induction Ventilation: Mask ventilation without difficulty Laryngoscope Size: Mac and 3 Grade View: Grade I Tube type: Oral Tube size: 7.0 mm Number of attempts: 1 Airway Equipment and Method: Stylet Placement Confirmation: ETT inserted through vocal cords under direct vision,  positive ETCO2,  CO2 detector and breath sounds checked- equal and bilateral Secured at: 21 cm Tube secured with: Tape Dental Injury: Teeth and Oropharynx as per pre-operative assessment

## 2019-04-14 LAB — BASIC METABOLIC PANEL
Anion gap: 10 (ref 5–15)
BUN: 12 mg/dL (ref 8–23)
CO2: 22 mmol/L (ref 22–32)
Calcium: 7.9 mg/dL — ABNORMAL LOW (ref 8.9–10.3)
Chloride: 100 mmol/L (ref 98–111)
Creatinine, Ser: 0.68 mg/dL (ref 0.44–1.00)
GFR calc Af Amer: >60 mL/min (ref >60)
GFR calc non Af Amer: >60 mL/min (ref >60)
Glucose, Bld: 127 mg/dL — ABNORMAL HIGH (ref 70–99)
Potassium: 3.9 mmol/L (ref 3.5–5.1)
Sodium: 132 mmol/L — ABNORMAL LOW (ref 135–145)

## 2019-04-14 LAB — CBC
HCT: 32.9 % — ABNORMAL LOW (ref 36.0–46.0)
Hemoglobin: 10.6 g/dL — ABNORMAL LOW (ref 12.0–15.0)
MCH: 30.4 pg (ref 26.0–34.0)
MCHC: 32.2 g/dL (ref 30.0–36.0)
MCV: 94.3 fL (ref 80.0–100.0)
Platelets: 301 10*3/uL (ref 150–400)
RBC: 3.49 MIL/uL — ABNORMAL LOW (ref 3.87–5.11)
RDW: 17.2 % — ABNORMAL HIGH (ref 11.5–15.5)
WBC: 9.1 10*3/uL (ref 4.0–10.5)
nRBC: 0 % (ref 0.0–0.2)

## 2019-04-14 LAB — URINE CULTURE: Culture: >=100000 — AB

## 2019-04-14 MED ORDER — HYDROCODONE-ACETAMINOPHEN 5-325 MG PO TABS: 1.0000 | ORAL_TABLET | ORAL | 0 refills | Status: DC | PRN

## 2019-04-14 MED ORDER — ASPIRIN 81 MG PO CHEW: 81.0000 mg | CHEWABLE_TABLET | Freq: Two times a day (BID) | ORAL | 1 refills | Status: AC

## 2019-04-14 NOTE — Progress Notes (Addendum)
PROGRESS NOTE    Cheryl Maldonado  ZLD:[id number] DOB: 26-Feb-1933 DOA: 04/11/2019 PCP: Thressa Sheller, MD (Inactive)    Brief Narrative:   Cheryl Maldonado  is a 85 y.o. female,w hypertension, osteoporosis, h/o breast/uterine cancer apparently presents with c/o mechanical fall about 1 week ago, tripped over drawer.  Pt noted left hip pain and it became severe enough where unable to walk.   In ED, T98.3  P 92  R 16  Bp 131/64  Pox 99% on RA.  Left hip x-ray noted for acute comminuted displaced left intratrochanteric fracture.  Chest x-ray no active cardiopulmonary disease.  Urinalysis with large leukoesterase, positive nitrite, 25-50 WBCs. Na 138, K 4.3,  Bun 25, Creatinine 0.76. Wbc 12.4, Hgb 11.2, Plt 348.  INR 1.0.  Pt was admitted under the hospital service following mechanical fall with left intratrochanteric fracture and UTI.  Orthopedics plans on surgical intervention.   Assessment & Plan:   Principal Problem:   Intertrochanteric fracture (HCC) Active Problems:   Acute lower UTI   Essential hypertension   Anemia   Intertrochanteric fracture of left hip (HCC)   Closed comminuted intertrochanteric fracture of left femur (HCC)    Comminuted, displaced L intertrochanteric fracture Patient presenting after a mechanical fall roughly 1 week prior to admission with progressive leg pain and inability to walk.  X-ray left femur notable for acute intertrochanteric fracture with displacement.  Underwent IM nail to left intertrochanteric hip fracture on 04/14/2019 by Dr. Delfino Lovett. --Pain controlled --Weightbearing as tolerated to left lower extremity with use of walker --On Lovenox for DVT prophylaxis, duration per orthopedics --Continue pain control with Dilaudid 0.5 mg IV every 6 hours as needed --PT OT, social work/case management for likely SNF  Acute cystitis without hematuria E. coli UTI Urinalysis with positive nitrite, large leukoesterase and 25-50 WBCs. Urine culture notable for E.  coli with pan susceptibility.  Completed 3-day course of ceftriaxone.  Essential hypertension Blood pressure well controlled, continue amlodipine 5 mg p.o. daily, irbesartan 300 mg p.o. daily  Hyperlipidemia: Continue Zocor   Hypothyroidism: TSH 4.293.  Continue levothyroxine 75 mcg p.o. daily  Anxiety: Continue Atarax 10 mg p.o. 3 times daily as needed  Anemia Hemoglobin 11.2 on admission with MCV of 100.3.  Iron 53, TIBC 231, ferritin 36.  Vitamin B12 1467. --Hgb 11.2-->9.2-->8.6-->10.6 --Continue close monitoring following surgical intervention --Transfuse for hemoglobin less than 7.0   DVT prophylaxis: Lovenox, duration per orthopedics Code Status: Full code Family Communication: None Disposition Plan: PT OT eval, likely SNF placement, case management aware  Consultants:   Orthopedics, Dr. Lyla Glassing  Procedures:   Left femur intramedullary nail fixation 04/13/2019  Antimicrobials:   Ceftriaxone 6/9 - 6/12  Perioperative Ancef   Subjective: Patient seen and examined at bedside, in good spirits, resting comfortably.  Pain is well controlled.  Reading magazines.  Awaiting PT/OT eval.  Discussed with her need for likely rehab placement.  No other complaints at this time.  Completed antibiotic course for UTI. Denies headache, no fever/chills/night sweats, no nausea/vomiting/diarrhea, no chest pain, no palpitations, no abdominal pain, no weakness, no paresthesias.  Objective: Vitals:   04/13/19 2024 04/14/19 0006 04/14/19 0411 04/14/19 0932  BP: (!) 128/53 (!) 126/57 125/62 115/60  Pulse: 80 89 89 76  Resp: 16 16 18 16   Temp: 98.6 F (37 C) 98.4 F (36.9 C) 98.4 F (36.9 C) 98.1 F (36.7 C)  TempSrc: Oral Oral Oral Oral  SpO2: 100% 94% 97% 100%  Weight:  Height:        Intake/Output Summary (Last 24 hours) at 04/14/2019 1314 Last data filed at 04/14/2019 1000 Gross per 24 hour  Intake 2300.69 ml  Output 2550 ml  Net -249.31 ml   Filed Weights   04/12/19  0500 04/13/19 0605 04/13/19 1354  Weight: 48.3 kg 50 kg 50 kg    Examination:  General exam: Appears calm and comfortable  Respiratory system: Clear to auscultation. Respiratory effort normal. Cardiovascular system: S1 & S2 heard, RRR. No JVD, murmurs, rubs, gallops or clicks. No pedal edema. Gastrointestinal system: Abdomen is nondistended, soft and nontender. No organomegaly or masses felt. Normal bowel sounds heard. Central nervous system: Alert and oriented. No focal neurological deficits. Extremities: Left hip with bruising and edema, mild pain on palpation Skin: No rashes, lesions or ulcers Psychiatry: Judgement and insight appear normal. Mood & affect appropriate.     Data Reviewed: I have personally reviewed following labs and imaging studies  CBC: Recent Labs  Lab 04/11/19 1830 04/12/19 0702 04/13/19 0811 04/14/19 0658  WBC 12.4* 7.6 8.8 9.1  NEUTROABS 10.0*  --   --   --   HGB 11.2* 9.2* 8.6* 10.6*  HCT 34.9* 29.2*   28.6* 28.4* 32.9*  MCV 100.3* 101.0* 102.2* 94.3  PLT 348 336 326 295   Basic Metabolic Panel: Recent Labs  Lab 04/11/19 1830 04/12/19 0702 04/13/19 0811 04/14/19 0658  NA 138 138 136 132*  K 4.3 3.8 3.9 3.9  CL 105 106 105 100  CO2 24 23 23 22   GLUCOSE 116* 96 96 127*  BUN 25* 21 17 12   CREATININE 0.76 0.68 0.69 0.68  CALCIUM 9.3 8.4* 8.0* 7.9*   GFR: Estimated Creatinine Clearance: 40.2 mL/min (by C-G formula based on SCr of 0.68 mg/dL). Liver Function Tests: Recent Labs  Lab 04/12/19 0702  AST 21  ALT 17  ALKPHOS 40  BILITOT 0.6  PROT 5.1*  ALBUMIN 3.0*   No results for input(s): LIPASE, AMYLASE in the last 168 hours. No results for input(s): AMMONIA in the last 168 hours. Coagulation Profile: Recent Labs  Lab 04/11/19 1830  INR 0.9   Cardiac Enzymes: No results for input(s): CKTOTAL, CKMB, CKMBINDEX, TROPONINI in the last 168 hours. BNP (last 3 results) No results for input(s): PROBNP in the last 8760  hours. HbA1C: No results for input(s): HGBA1C in the last 72 hours. CBG: No results for input(s): GLUCAP in the last 168 hours. Lipid Profile: No results for input(s): CHOL, HDL, LDLCALC, TRIG, CHOLHDL, LDLDIRECT in the last 72 hours. Thyroid Function Tests: Recent Labs    04/12/19 0702  TSH 4.293   Anemia Panel: Recent Labs    04/12/19 0702  VITAMINB12 1,467*  FERRITIN 36  TIBC 231*  IRON 53   Sepsis Labs: No results for input(s): PROCALCITON, LATICACIDVEN in the last 168 hours.  Recent Results (from the past 240 hour(s))  Urine culture     Status: Abnormal   Collection Time: 04/11/19  5:51 PM   Specimen: Urine, Clean Catch  Result Value Ref Range Status   Specimen Description   Final    URINE, CLEAN CATCH Performed at [facility], [place] [address] [place], [place] [address]    Special Requests   Final    NONE Performed at [facility], [place] [address], [place], [place] [address]    Culture >=100,000 COLONIES/mL ESCHERICHIA COLI (A)  Final   Report Status 04/14/2019 FINAL  Final   Organism ID,  Bacteria ESCHERICHIA COLI (A)  Final      Susceptibility   Escherichia coli - MIC*    AMPICILLIN <=2 SENSITIVE Sensitive     CEFAZOLIN <=4 SENSITIVE Sensitive     CEFTRIAXONE <=1 SENSITIVE Sensitive     CIPROFLOXACIN <=0.25 SENSITIVE Sensitive     GENTAMICIN <=1 SENSITIVE Sensitive     IMIPENEM <=0.25 SENSITIVE Sensitive     NITROFURANTOIN <=16 SENSITIVE Sensitive     TRIMETH/SULFA <=20 SENSITIVE Sensitive     AMPICILLIN/SULBACTAM <=2 SENSITIVE Sensitive     PIP/TAZO <=4 SENSITIVE Sensitive     Extended ESBL NEGATIVE Sensitive     * >=100,000 COLONIES/mL ESCHERICHIA COLI  SARS Coronavirus 2 (CEPHEID - Performed in [place] hospital lab), Hosp Order     Status: None   Collection Time: 04/11/19  6:03 PM   Specimen: Nasopharyngeal Swab  Result Value Ref Range Status   SARS Coronavirus 2 NEGATIVE NEGATIVE Final    Comment:  (NOTE) If result is NEGATIVE SARS-CoV-2 target nucleic acids are NOT DETECTED. The SARS-CoV-2 RNA is generally detectable in upper and lower  respiratory specimens during the acute phase of infection. The lowest  concentration of SARS-CoV-2 viral copies this assay can detect is 250  copies / mL. A negative result does not preclude SARS-CoV-2 infection  and should not be used as the sole basis for treatment or other  patient management decisions.  A negative result may occur with  improper specimen collection / handling, submission of specimen other  than nasopharyngeal swab, presence of viral mutation(s) within the  areas targeted by this assay, and inadequate number of viral copies  (<250 copies / mL). A negative result must be combined with clinical  observations, patient history, and epidemiological information. If result is POSITIVE SARS-CoV-2 target nucleic acids are DETECTED. The SARS-CoV-2 RNA is generally detectable in upper and lower  respiratory specimens dur ing the acute phase of infection.  Positive  results are indicative of active infection with SARS-CoV-2.  Clinical  correlation with patient history and other diagnostic information is  necessary to determine patient infection status.  Positive results do  not rule out bacterial infection or co-infection with other viruses. If result is PRESUMPTIVE POSTIVE SARS-CoV-2 nucleic acids MAY BE PRESENT.   A presumptive positive result was obtained on the submitted specimen  and confirmed on repeat testing.  While 2019 novel coronavirus  (SARS-CoV-2) nucleic acids may be present in the submitted sample  additional confirmatory testing may be necessary for epidemiological  and / or clinical management purposes  to differentiate between  SARS-CoV-2 and other Sarbecovirus currently known to infect humans.  If clinically indicated additional testing with an alternate test  methodology [phone]) is advised. The SARS-CoV-2 RNA is  generally  detectable in upper and lower respiratory sp ecimens during the acute  phase of infection. The expected result is Negative. Fact Sheet for Patients:  StrictlyIdeas.no Fact Sheet for Healthcare Providers: BankingDealers.co.za This test is not yet approved or cleared by the [place] FDA and has been authorized for detection and/or diagnosis of SARS-CoV-2 by FDA under an Emergency Use Authorization (EUA).  This EUA will remain in effect (meaning this test can be used) for the duration of the COVID-19 declaration under Section 564(b)(1) of the Act, 21 U.S.C. section 360bbb-3(b)(1), unless the authorization is terminated or revoked sooner. Performed at [facility], [place] [address], [place], [place] [address]   Surgical PCR screen     Status: None   Collection  Time: 04/11/19  9:29 PM   Specimen: Nasal Mucosa; Nasal Swab  Result Value Ref Range Status   MRSA, PCR NEGATIVE NEGATIVE Final   Staphylococcus aureus NEGATIVE NEGATIVE Final    Comment: (NOTE) The Xpert SA Assay (FDA approved for NASAL specimens in patients 41 years of age and older), is one component of a comprehensive surveillance program. It is not intended to diagnose infection nor to guide or monitor treatment. Performed at [facility], [place] [address] [place], [place] [address]          Radiology Studies: Pelvis Portable  Result Date: 04/13/2019 CLINICAL DATA:  Postop left femur fracture EXAM: PORTABLE PELVIS 1-2 VIEWS COMPARISON:  04/11/2019, 04/13/2019 FINDINGS: Numerous surgical clips within the pelvis. Interval intramedullary rod and distal screw fixation of the left femur for comminuted intertrochanteric fracture. Hardware appears intact. Displaced lesser trochanteric fracture fragment. Gas in the soft tissues. IMPRESSION: Interval intramedullary rod and screw fixation of the left femur for comminuted  intertrochanteric fracture. Electronically Signed   By: Donavan Foil M.D.   On: 04/13/2019 17:06   Dg C-arm 1-60 Min-no Report  Result Date: 04/13/2019 Fluoroscopy was utilized by the requesting physician.  No radiographic interpretation.   Dg Hip Operative Unilat W Or W/o Pelvis Left  Result Date: 04/13/2019 CLINICAL DATA:  Intramedullary nail left hip fracture. EXAM: OPERATIVE left HIP (WITH PELVIS IF PERFORMED) 3 VIEWS TECHNIQUE: Fluoroscopic spot image(s) were submitted for interpretation post-operatively. COMPARISON:  04/11/2019 FINDINGS: Exam demonstrates fixation of patient's left femoral trochanteric fracture with intramedullary nail and associated compression screw bridging the fracture site into the femoral head. Hardware is intact with anatomic alignment over the fracture site. Remainder of the exam is unchanged. IMPRESSION: Fixation of left femoral trochanteric fracture with hardware intact and anatomic alignment over the fracture site. Electronically Signed   By: Marin Olp M.D.   On: 04/13/2019 15:50        Scheduled Meds:  amLODipine  5 mg Oral Daily   docusate sodium  100 mg Oral BID   enoxaparin (LOVENOX) injection  40 mg Subcutaneous Q24H   irbesartan  300 mg Oral Daily   levothyroxine  75 mcg Oral Q0600   senna  1 tablet Oral BID   simvastatin  5 mg Oral QODAY   Continuous Infusions:  sodium chloride 250 mL (04/13/19 1755)     LOS: 3 days    Time spent: 28 minutes    Ramesh Moan J [place] DO [facility] Pager [phone]  If 7PM-7AM, please contact night-coverage [email or web address] Password [email or web address] 04/14/2019, 1:14 PM

## 2019-04-14 NOTE — Evaluation (Signed)
Physical Therapy Evaluation Patient Details Name: Cheryl Maldonado MRN: [id number] DOB: Jan 08, 1933 Today's Date: 04/14/2019   History of Present Illness  85 y.o. female,w hypertension, osteoporosis, h/o breast/uterine cancer apparently presents with c/o mechanical fall about 1 week ago, tripped over drawer and sustained acute comminuted displaced left intratrochanteric fracture.  Pt now s/p L femur IM nail on 04/13/19  Clinical Impression  Patient is s/p above surgery resulting in functional limitations due to the deficits listed below (see PT Problem List).  Patient will benefit from skilled PT to increase their independence and safety with mobility to allow discharge to the venue listed below.  Pt assisted with ambulating short distance.  Pt with posterior leaning with standing and during ambulation. Recommend physical assist available at home if d/c home.  Pt reports uncertainty about assist available so may need SNF.  Will continue to assist with safely mobilizing during acute admission.  Pt prefers d/c home.     Follow Up Recommendations Supervision/Assistance - 24 hour;SNF;Home health PT;Other (comment)(if family able to provide physical assist then home)    Equipment Recommendations  Rolling walker with 5" wheels;3in1 (PT);Other (comment)(youth)    Recommendations for Other Services       Precautions / Restrictions Precautions Precautions: Fall Restrictions Other Position/Activity Restrictions: WBAT      Mobility  Bed Mobility Overal bed mobility: Needs Assistance Bed Mobility: Supine to Sit     Supine to sit: Min guard;HOB elevated     General bed mobility comments: verbal cues for technique, pt self assisted L LE  Transfers Overall transfer level: Needs assistance Equipment used: Rolling walker (2 wheeled) Transfers: Sit to/from Stand Sit to Stand: Min assist         General transfer comment: verbal cues for UE and LE positioning, assist to rise and steady,  posterior bias  Ambulation/Gait Ambulation/Gait assistance: Min assist;Mod assist Gait Distance (Feet): 20 Feet Assistive device: Rolling walker (2 wheeled) Gait Pattern/deviations: Step-to pattern;Step-through pattern;Decreased stride length;Leaning posteriorly     General Gait Details: multimodal cues for use of RW, step length and sequence; pt with difficulty keeping RW forward (too close to RW) and also presents with posterior leaning requiring min to mod assist, slightly improved with distance  Stairs            Wheelchair Mobility    Modified Rankin (Stroke Patients Only)       Balance Overall balance assessment: History of Falls;Needs assistance         Standing balance support: Bilateral upper extremity supported Standing balance-Leahy Scale: Poor Standing balance comment: requiring UE support and external assist                             Pertinent Vitals/Pain Pain Assessment: Faces Faces Pain Scale: Hurts even more Pain Location: L hip Pain Descriptors / Indicators: Guarding;Aching;Sore Pain Intervention(s): Premedicated before session;Ice applied;Limited activity within patient's tolerance;Monitored during session;Repositioned    Home Living Family/patient expects to be discharged to:: Private residence Living Arrangements: Alone Available Help at Discharge: Family;Available PRN/intermittently Type of Home: House Home Access: Stairs to enter   Entrance Stairs-Number of Steps: 4 Home Layout: Able to live on main level with bedroom/bathroom Home Equipment: None      Prior Function Level of Independence: Independent               Hand Dominance        Extremity/Trunk Assessment  Lower Extremity Assessment Lower Extremity Assessment: Generalized weakness;LLE deficits/detail LLE Deficits / Details: anticipated post op hip weakness, requiring assist for mobility       Communication   Communication: No difficulties   Cognition Arousal/Alertness: Awake/alert Behavior During Therapy: WFL for tasks assessed/performed Overall Cognitive Status: Within Functional Limits for tasks assessed                                        General Comments      Exercises     Assessment/Plan    PT Assessment Patient needs continued PT services  PT Problem List Decreased strength;Decreased mobility;Decreased activity tolerance;Decreased balance;Decreased knowledge of use of DME;Pain       PT Treatment Interventions DME instruction;Therapeutic activities;Gait training;Therapeutic exercise;Stair training;Functional mobility training;Balance training;Patient/family education    PT Goals (Current goals can be found in the Care Plan section)  Acute Rehab PT Goals PT Goal Formulation: With patient Time For Goal Achievement: 04/21/19 Potential to Achieve Goals: Good    Frequency Min 5X/week   Barriers to discharge        Co-evaluation               AM-PAC PT "6 Clicks" Mobility  Outcome Measure Help needed turning from your back to your side while in a flat bed without using bedrails?: A Little Help needed moving from lying on your back to sitting on the side of a flat bed without using bedrails?: A Little Help needed moving to and from a bed to a chair (including a wheelchair)?: A Little Help needed standing up from a chair using your arms (e.g., wheelchair or bedside chair)?: A Little Help needed to walk in hospital room?: A Lot Help needed climbing 3-5 steps with a railing? : A Lot 6 Click Score: 16    End of Session Equipment Utilized During Treatment: Gait belt Activity Tolerance: Patient tolerated treatment well Patient left: in chair;with chair alarm set;with call bell/phone within reach Nurse Communication: Mobility status PT Visit Diagnosis: Other abnormalities of gait and mobility (R26.89);Unsteadiness on feet (R26.81)    Time: 0321-2248 PT Time Calculation (min) (ACUTE  ONLY): 16 min   Charges:   PT Evaluation $PT Eval Low Complexity: [place], PT, DPT Acute Rehabilitation Services Office: [phone] Pager: [phone]  Trena Platt 04/14/2019, 1:38 PM

## 2019-04-14 NOTE — Progress Notes (Signed)
    Subjective:  Patient reports pain as mild to moderate.  Denies N/V/CP/SOB.   Objective:   VITALS:   Vitals:   04/13/19 2024 04/14/19 0006 04/14/19 0411 04/14/19 0932  BP: (!) 128/53 (!) 126/57 125/62 115/60  Pulse: 80 89 89 76  Resp: 16 16 18 16   Temp: 98.6 F (37 C) 98.4 F (36.9 C) 98.4 F (36.9 C) 98.1 F (36.7 C)  TempSrc: Oral Oral Oral Oral  SpO2: 100% 94% 97% 100%  Weight:      Height:        NAD ABD soft Sensation intact distally Intact pulses distally Dorsiflexion/Plantar flexion intact Incision: dressing C/D/I Compartment soft   Lab Results  Component Value Date   WBC 9.1 04/14/2019   HGB 10.6 (L) 04/14/2019   HCT 32.9 (L) 04/14/2019   MCV 94.3 04/14/2019   PLT 301 04/14/2019   BMET    Component Value Date/Time   NA 132 (L) 04/14/2019 0658   K 3.9 04/14/2019 0658   CL 100 04/14/2019 0658   CO2 22 04/14/2019 0658   GLUCOSE 127 (H) 04/14/2019 0658   BUN 12 04/14/2019 0658   CREATININE 0.68 04/14/2019 0658   CALCIUM 7.9 (L) 04/14/2019 0658   GFRNONAA >60 04/14/2019 0658   GFRAA >60 04/14/2019 0658     Assessment/Plan: 1 Day Post-Op   Principal Problem:   Intertrochanteric fracture (HCC) Active Problems:   Acute lower UTI   Essential hypertension   Anemia   Intertrochanteric fracture of left hip (HCC)   Closed comminuted intertrochanteric fracture of left femur (HCC)   WBAT with walker DVT ppx: Lovenox in house --> d/c on ASA 81 mg PO BID, SCDs, TEDS PO pain control PT/OT Dispo: D/C planning   Cheryl Maldonado 04/14/2019, 1:24 PM   Rod Can, MD Cell: [phone] [facility]  [place] [address] [place] [address], [place], [place] [address] Phone: [phone] [email or web address] Facebook  [employer]

## 2019-04-14 NOTE — Plan of Care (Signed)
Pt has only required pain meds one time this shift.  Pt compliant with repositioning Q 2hrs. Pt denied hunger. Stated she would eat in the am.

## 2019-04-14 NOTE — Care Management Important Message (Signed)
Important Message  Patient Details IM Letter given to Dessa Phi RN to present to the Patient Name: Cheryl Maldonado MRN: [id number] Date of Birth: September 09, 1933   Medicare Important Message Given:  Yes    Kerin Salen 04/14/2019, 11:06 AM

## 2019-04-14 NOTE — Progress Notes (Signed)
Pt is currently on D4 abx for her asymptomatic bacteruria. Ok to dc ceftriaxone per Dr [place]  Onnie Boer, PharmD, BCIDP, AAHIVP, CPP Infectious Disease Pharmacist 04/14/2019 11:06 AM

## 2019-04-14 NOTE — TOC Initial Note (Signed)
Transition of Care [facility]) - Initial/Assessment Note    Patient Details  Name: Cheryl Maldonado MRN: [id number] Date of Birth: 09/01/1933  Transition of Care [facility]) CM/SW Contact:    Dessa Phi, RN Phone Number: 04/14/2019, 12:13 PM  Clinical Narrative:  Patient states she lives alone but her Son is getting [employer] set up for her home needs. States she has a rw.Patient is in agreement if SNF is needed but to talk to her son. Will await PT eval prior to contacting Son for reasonable d/c plan.                 Expected Discharge Plan: [place] Barriers to Discharge: Continued Medical Work up   Patient Goals and CMS Choice        Expected Discharge Plan and Services Expected Discharge Plan: [place]   Discharge Planning Services: CM Consult   Living arrangements for the past 2 months: Single Family Home                                      Prior Living Arrangements/Services Living arrangements for the past 2 months: Single Family Home Lives with:: Self Patient language and need for interpreter reviewed:: Yes Do you feel safe going back to the place where you live?: Yes      Need for Family Participation in Patient Care: No (Comment) Care giver support system in place?: Yes (comment) Current home services: DME(rolling walker) Criminal Activity/Legal Involvement Pertinent to Current Situation/Hospitalization: No - Comment as needed  Activities of Daily Living Home Assistive Devices/Equipment: None ADL Screening (condition at time of admission) Patient's cognitive ability adequate to safely complete daily activities?: Yes Is the patient deaf or have difficulty hearing?: No Does the patient have difficulty seeing, even when wearing glasses/contacts?: No Does the patient have difficulty concentrating, remembering, or making decisions?: No Patient able to express need for assistance with ADLs?: Yes Does the patient have  difficulty dressing or bathing?: Yes Independently performs ADLs?: No Communication: Independent Dressing (OT): Needs assistance(due to hip fracture) Is this a change from baseline?: Change from baseline, expected to last <3days Grooming: Needs assistance Is this a change from baseline?: Change from baseline, expected to last <3 days Feeding: Independent Bathing: Needs assistance Is this a change from baseline?: Change from baseline, expected to last <3 days Toileting: Needs assistance Is this a change from baseline?: Change from baseline, expected to last <3 days In/Out Bed: Needs assistance Is this a change from baseline?: Change from baseline, expected to last <3 days Walks in Home: Independent Does the patient have difficulty walking or climbing stairs?: Yes(current hip fracture) Weakness of Legs: None Weakness of Arms/Hands: None  Permission Sought/Granted Permission sought to share information with : Case Manager Permission granted to share information with : Yes, Verbal Permission Granted  Share Information with NAME: Elta Guadeloupe     Permission granted to share info w Relationship: Son  Permission granted to share info w Contact Information: [phone]  Emotional Assessment Appearance:: Appears stated age Attitude/Demeanor/Rapport: Gracious   Orientation: : Oriented to Self, Oriented to Place, Oriented to  Time, Oriented to Situation Alcohol / Substance Use: Never Used Psych Involvement: No (comment)  Admission diagnosis:  Acute cystitis without hematuria [N30.00] Closed displaced intertrochanteric fracture of left femur, initial encounter ([place]) [S72.142A] Fall, initial encounter [W19.XXXA] Covid-19 Virus not Detected [IMO0001] Intertrochanteric fracture  of left hip [facility]) [S72.142A] Patient Active Problem List   Diagnosis Date Noted  . Closed comminuted intertrochanteric fracture of left femur ([place]) 04/13/2019  . Intertrochanteric fracture ([place]) 04/11/2019  . Acute lower  UTI 04/11/2019  . Essential hypertension 04/11/2019  . Anemia 04/11/2019  . Intertrochanteric fracture of left hip ([place]) 04/11/2019   PCP:  Thressa Sheller, MD (Inactive) Pharmacy:   [facility] [address] [place] [address] Phone: [phone] Fax: [phone]     Social Determinants of Health (SDOH) Interventions    Readmission Risk Interventions No flowsheet data found.

## 2019-04-15 LAB — BPAM RBC
Blood Product Expiration Date: 202006242359
Blood Product Expiration Date: 202006272359
ISSUE DATE / TIME: 202006111338
ISSUE DATE / TIME: 202006111338
Unit Type and Rh: 6200
Unit Type and Rh: 6200

## 2019-04-15 LAB — CBC
HCT: 31.7 % — ABNORMAL LOW (ref 36.0–46.0)
Hemoglobin: 10.2 g/dL — ABNORMAL LOW (ref 12.0–15.0)
MCH: 30.5 pg (ref 26.0–34.0)
MCHC: 32.2 g/dL (ref 30.0–36.0)
MCV: 94.9 fL (ref 80.0–100.0)
Platelets: 328 10*3/uL (ref 150–400)
RBC: 3.34 MIL/uL — ABNORMAL LOW (ref 3.87–5.11)
RDW: 16.9 % — ABNORMAL HIGH (ref 11.5–15.5)
WBC: 8.9 10*3/uL (ref 4.0–10.5)
nRBC: 0 % (ref 0.0–0.2)

## 2019-04-15 LAB — BASIC METABOLIC PANEL
Anion gap: 6 (ref 5–15)
BUN: 18 mg/dL (ref 8–23)
CO2: 27 mmol/L (ref 22–32)
Calcium: 8.2 mg/dL — ABNORMAL LOW (ref 8.9–10.3)
Chloride: 103 mmol/L (ref 98–111)
Creatinine, Ser: 0.67 mg/dL (ref 0.44–1.00)
GFR calc Af Amer: >60 mL/min (ref >60)
GFR calc non Af Amer: >60 mL/min (ref >60)
Glucose, Bld: 91 mg/dL (ref 70–99)
Potassium: 3.8 mmol/L (ref 3.5–5.1)
Sodium: 136 mmol/L (ref 135–145)

## 2019-04-15 LAB — TYPE AND SCREEN
ABO/RH(D): A POS
Antibody Screen: NEGATIVE
Unit division: 0
Unit division: 0

## 2019-04-15 MED ORDER — DOCUSATE SODIUM 100 MG PO CAPS: 100.0000 mg | ORAL_CAPSULE | Freq: Two times a day (BID) | ORAL | 0 refills | Status: AC

## 2019-04-15 MED ORDER — HYDROCODONE-ACETAMINOPHEN 5-325 MG PO TABS: 1.0000 | ORAL_TABLET | ORAL | 0 refills | Status: DC | PRN

## 2019-04-15 NOTE — Progress Notes (Signed)
Physical Therapy Treatment Patient Details Name: Cheryl Maldonado MRN: [id number] DOB: 1933-01-08 Today's Date: 04/15/2019    History of Present Illness 85 y.o. female,w hypertension, osteoporosis, h/o breast/uterine cancer apparently presents with c/o mechanical fall about 1 week ago, tripped over drawer and sustained acute comminuted displaced left intratrochanteric fracture.  Pt now s/p L femur IM nail on 04/13/19    PT Comments    Pt able to increase ambulation distance.  She will have 24 hour assistance at home.  She states her son is having a ramp put in.  PT called and left him a VM to clarify when ramp will be put in.  Recommend 3-1, youth RW, and HHPT.  Follow Up Recommendations  Home health PT;Supervision/Assistance - 24 hour     Equipment Recommendations  Rolling walker with 5" wheels;3in1 (PT);Other (comment)    Recommendations for Other Services       Precautions / Restrictions Precautions Precautions: Fall Restrictions Weight Bearing Restrictions: No Other Position/Activity Restrictions: WBAT    Mobility  Bed Mobility Overal bed mobility: Needs Assistance Bed Mobility: Supine to Sit     Supine to sit: Min guard     General bed mobility comments: required use of rail to get to EOB  Transfers Overall transfer level: Needs assistance Equipment used: Rolling walker (2 wheeled) Transfers: Sit to/from Stand Sit to Stand: Min assist         General transfer comment: cues for proper form and hand placement slight posterior lean  Ambulation/Gait Ambulation/Gait assistance: Min assist;Mod assist Gait Distance (Feet): 60 Feet Assistive device: Rolling walker (2 wheeled) Gait Pattern/deviations: Step-to pattern;Step-through pattern;Decreased stance time - left;Antalgic Gait velocity: decreased   General Gait Details: Initially required MOD A and had poor placement of RW, poor sequencing, and poor foot placement.  With education this improved and was able to have  a normalized gait pattern with MIN A, but then she beamce fatigued and had a step to pattern.   Stairs             Wheelchair Mobility    Modified Rankin (Stroke Patients Only)       Balance Overall balance assessment: History of Falls;Needs assistance           Standing balance-Leahy Scale: Poor Standing balance comment: requires UE support                            Cognition Arousal/Alertness: Awake/alert Behavior During Therapy: WFL for tasks assessed/performed Overall Cognitive Status: Within Functional Limits for tasks assessed                                        Exercises General Exercises - Lower Extremity Ankle Circles/Pumps: Both;10 reps Quad Sets: Strengthening;Left;10 reps;Supine Heel Slides: AROM;Left;10 reps Hip ABduction/ADduction: AROM;Left;10 reps    General Comments        Pertinent Vitals/Pain Pain Assessment: 0-10 Pain Score: 3  Pain Location: L hip with movement Pain Descriptors / Indicators: Operative site guarding;Sore Pain Intervention(s): Monitored during session;Premedicated before session;Limited activity within patient's tolerance;Repositioned    Home Living                      Prior Function            PT Goals (current goals can now be found in the care plan section) Acute  Rehab PT Goals PT Goal Formulation: With patient Time For Goal Achievement: 04/21/19 Potential to Achieve Goals: Good Progress towards PT goals: Progressing toward goals    Frequency    Min 5X/week      PT Plan Current plan remains appropriate    Co-evaluation              AM-PAC PT "6 Clicks" Mobility   Outcome Measure  Help needed turning from your back to your side while in a flat bed without using bedrails?: A Little Help needed moving from lying on your back to sitting on the side of a flat bed without using bedrails?: A Little Help needed moving to and from a bed to a chair (including  a wheelchair)?: A Little Help needed standing up from a chair using your arms (e.g., wheelchair or bedside chair)?: A Little Help needed to walk in hospital room?: A Little Help needed climbing 3-5 steps with a railing? : A Lot 6 Click Score: 17    End of Session Equipment Utilized During Treatment: Gait belt Activity Tolerance: Patient tolerated treatment well Patient left: in bed;with bed alarm set;with call bell/phone within reach Nurse Communication: Mobility status PT Visit Diagnosis: Other abnormalities of gait and mobility (R26.89);Unsteadiness on feet (R26.81)     Time: 8916-9450 PT Time Calculation (min) (ACUTE ONLY): 27 min  Charges:  $Gait Training: 8-22 mins $Therapeutic Exercise: 8-22 mins                     Iman Reinertsen L. Tamala Julian, [place] Pager [phone] 04/15/2019    [name] 04/15/2019, 12:57 PM

## 2019-04-15 NOTE — Progress Notes (Signed)
PROGRESS NOTE    Cheryl Maldonado  EQA:[id number] DOB: 26-Jul-1933 DOA: 04/11/2019 PCP: Thressa Sheller, MD (Inactive)    Brief Narrative:   Cheryl Maldonado  is a 85 y.o. female,w hypertension, osteoporosis, h/o breast/uterine cancer apparently presents with c/o mechanical fall about 1 week ago, tripped over drawer.  Pt noted left hip pain and it became severe enough where unable to walk.   In ED, T98.3  P 92  R 16  Bp 131/64  Pox 99% on RA.  Left hip x-ray noted for acute comminuted displaced left intratrochanteric fracture.  Chest x-ray no active cardiopulmonary disease.  Urinalysis with large leukoesterase, positive nitrite, 25-50 WBCs. Na 138, K 4.3,  Bun 25, Creatinine 0.76. Wbc 12.4, Hgb 11.2, Plt 348.  INR 1.0.  Pt was admitted under the hospital service following mechanical fall with left intratrochanteric fracture and UTI.  Orthopedics plans on surgical intervention.   Assessment & Plan:   Principal Problem:   Intertrochanteric fracture (HCC) Active Problems:   Acute lower UTI   Essential hypertension   Anemia   Intertrochanteric fracture of left hip (HCC)   Closed comminuted intertrochanteric fracture of left femur (HCC)    Comminuted, displaced L intertrochanteric fracture Patient presenting after a mechanical fall roughly 1 week prior to admission with progressive leg pain and inability to walk.  X-ray left femur notable for acute intertrochanteric fracture with displacement.  Underwent IM nail to left intertrochanteric hip fracture on 04/14/2019 by Dr. Delfino Lovett. --Pain controlled --Weightbearing as tolerated to left lower extremity with use of walker --On Lovenox for DVT prophylaxis, transition to aspirin 81 mg p.o. twice daily on discharge --Continue pain control  --PT recommends home with home health if 24-hour care versus SNF.  Patient and son desire home, plan for discharge home tomorrow.  Case management aware.  Acute cystitis without hematuria E. coli UTI Urinalysis  with positive nitrite, large leukoesterase and 25-50 WBCs. Urine culture notable for E. coli with pan susceptibility.  Completed 3-day course of ceftriaxone.  Essential hypertension Blood pressure well controlled, continue amlodipine 5 mg p.o. daily, irbesartan 300 mg p.o. daily  Hyperlipidemia: Continue Zocor   Hypothyroidism: TSH 4.293.  Continue levothyroxine 75 mcg p.o. daily  Anxiety: Continue Atarax 10 mg p.o. 3 times daily as needed  Anemia Hemoglobin 11.2 on admission with MCV of 100.3.  Iron 53, TIBC 231, ferritin 36.  Vitamin B12 1467. --Hgb 11.2-->9.2-->8.6-->10.6-->10.2 --stable   DVT prophylaxis: Lovenox Code Status: Full code Family Communication: Updated son Cheryl Maldonado by telephone this morning Disposition Plan: Discharge home with home health on 04/16/2019  Consultants:   Orthopedics, Dr. Lyla Glassing  Procedures:   Left femur intramedullary nail fixation 04/13/2019  Antimicrobials:   Ceftriaxone 6/9 - 6/12  Perioperative Ancef   Subjective: Patient seen and examined at bedside, in good spirits, sitting in bedside chair reading magazines. No other complaints at this time. Denies headache, no fever/chills/night sweats, no nausea/vomiting/diarrhea, no chest pain, no palpitations, no abdominal pain, no weakness, no paresthesias.  Patient will be discharging home tomorrow with home health services and 24-hour care arranged by her family.  Updated son via telephone this morning.  No other concerns.  No acute concerns overnight per nursing staff.  Objective: Vitals:   04/14/19 0932 04/14/19 1349 04/14/19 2055 04/15/19 0536  BP: 115/60 (!) 108/48 (!) 115/54 (!) 113/50  Pulse: 76 79 77 73  Resp: 16 14 16 16   Temp: 98.1 F (36.7 C) 97.9 F (36.6 C) 98 F (36.7 C) 99 F (37.2  C)  TempSrc: Oral Oral Oral Oral  SpO2: 100% 98% 97% 97%  Weight:      Height:        Intake/Output Summary (Last 24 hours) at 04/15/2019 1217 Last data filed at 04/15/2019 0530 Gross per 24  hour  Intake 251.51 ml  Output --  Net 251.51 ml   Filed Weights   04/12/19 0500 04/13/19 0605 04/13/19 1354  Weight: 48.3 kg 50 kg 50 kg    Examination:  General exam: Appears calm and comfortable  Respiratory system: Clear to auscultation. Respiratory effort normal. Cardiovascular system: S1 & S2 heard, RRR. No JVD, murmurs, rubs, gallops or clicks. No pedal edema. Gastrointestinal system: Abdomen is nondistended, soft and nontender. No organomegaly or masses felt. Normal bowel sounds heard. Central nervous system: Alert and oriented. No focal neurological deficits. Extremities: Left hip with bruising and edema, mild pain on palpation Skin: No rashes, lesions or ulcers Psychiatry: Judgement and insight appear normal. Mood & affect appropriate.     Data Reviewed: I have personally reviewed following labs and imaging studies  CBC: Recent Labs  Lab 04/11/19 1830 04/12/19 0702 04/13/19 0811 04/14/19 0658 04/15/19 0456  WBC 12.4* 7.6 8.8 9.1 8.9  NEUTROABS 10.0*  --   --   --   --   HGB 11.2* 9.2* 8.6* 10.6* 10.2*  HCT 34.9* 29.2*   28.6* 28.4* 32.9* 31.7*  MCV 100.3* 101.0* 102.2* 94.3 94.9  PLT 348 336 326 301 662   Basic Metabolic Panel: Recent Labs  Lab 04/11/19 1830 04/12/19 0702 04/13/19 0811 04/14/19 0658 04/15/19 0456  NA 138 138 136 132* 136  K 4.3 3.8 3.9 3.9 3.8  CL 105 106 105 100 103  CO2 24 23 23 22 27   GLUCOSE 116* 96 96 127* 91  BUN 25* 21 17 12 18   CREATININE 0.76 0.68 0.69 0.68 0.67  CALCIUM 9.3 8.4* 8.0* 7.9* 8.2*   GFR: Estimated Creatinine Clearance: 40.2 mL/min (by C-G formula based on SCr of 0.67 mg/dL). Liver Function Tests: Recent Labs  Lab 04/12/19 0702  AST 21  ALT 17  ALKPHOS 40  BILITOT 0.6  PROT 5.1*  ALBUMIN 3.0*   No results for input(s): LIPASE, AMYLASE in the last 168 hours. No results for input(s): AMMONIA in the last 168 hours. Coagulation Profile: Recent Labs  Lab 04/11/19 1830  INR 0.9   Cardiac  Enzymes: No results for input(s): CKTOTAL, CKMB, CKMBINDEX, TROPONINI in the last 168 hours. BNP (last 3 results) No results for input(s): PROBNP in the last 8760 hours. HbA1C: No results for input(s): HGBA1C in the last 72 hours. CBG: No results for input(s): GLUCAP in the last 168 hours. Lipid Profile: No results for input(s): CHOL, HDL, LDLCALC, TRIG, CHOLHDL, LDLDIRECT in the last 72 hours. Thyroid Function Tests: No results for input(s): TSH, T4TOTAL, FREET4, T3FREE, THYROIDAB in the last 72 hours. Anemia Panel: No results for input(s): VITAMINB12, FOLATE, FERRITIN, TIBC, IRON, RETICCTPCT in the last 72 hours. Sepsis Labs: No results for input(s): PROCALCITON, LATICACIDVEN in the last 168 hours.  Recent Results (from the past 240 hour(s))  Urine culture     Status: Abnormal   Collection Time: 04/11/19  5:51 PM   Specimen: Urine, Clean Catch  Result Value Ref Range Status   Specimen Description   Final    URINE, CLEAN CATCH Performed at [facility], [place] [address], [place], [place] [address]    Special Requests   Final    NONE Performed  at [facility], [place] [address] [place], [place] [address]    Culture >=100,000 COLONIES/mL ESCHERICHIA COLI (A)  Final   Report Status 04/14/2019 FINAL  Final   Organism ID, Bacteria ESCHERICHIA COLI (A)  Final      Susceptibility   Escherichia coli - MIC*    AMPICILLIN <=2 SENSITIVE Sensitive     CEFAZOLIN <=4 SENSITIVE Sensitive     CEFTRIAXONE <=1 SENSITIVE Sensitive     CIPROFLOXACIN <=0.25 SENSITIVE Sensitive     GENTAMICIN <=1 SENSITIVE Sensitive     IMIPENEM <=0.25 SENSITIVE Sensitive     NITROFURANTOIN <=16 SENSITIVE Sensitive     TRIMETH/SULFA <=20 SENSITIVE Sensitive     AMPICILLIN/SULBACTAM <=2 SENSITIVE Sensitive     PIP/TAZO <=4 SENSITIVE Sensitive     Extended ESBL NEGATIVE Sensitive     * >=100,000 COLONIES/mL ESCHERICHIA COLI  SARS Coronavirus 2 (CEPHEID - Performed in [place] hospital lab), Hosp Order     Status: None   Collection Time: 04/11/19  6:03 PM   Specimen: Nasopharyngeal Swab  Result Value Ref Range Status   SARS Coronavirus 2 NEGATIVE NEGATIVE Final    Comment: (NOTE) If result is NEGATIVE SARS-CoV-2 target nucleic acids are NOT DETECTED. The SARS-CoV-2 RNA is generally detectable in upper and lower  respiratory specimens during the acute phase of infection. The lowest  concentration of SARS-CoV-2 viral copies this assay can detect is 250  copies / mL. A negative result does not preclude SARS-CoV-2 infection  and should not be used as the sole basis for treatment or other  patient management decisions.  A negative result may occur with  improper specimen collection / handling, submission of specimen other  than nasopharyngeal swab, presence of viral mutation(s) within the  areas targeted by this assay, and inadequate number of viral copies  (<250 copies / mL). A negative result must be combined with clinical  observations, patient history, and epidemiological information. If result is POSITIVE SARS-CoV-2 target nucleic acids are DETECTED. The SARS-CoV-2 RNA is generally detectable in upper and lower  respiratory specimens dur ing the acute phase of infection.  Positive  results are indicative of active infection with SARS-CoV-2.  Clinical  correlation with patient history and other diagnostic information is  necessary to determine patient infection status.  Positive results do  not rule out bacterial infection or co-infection with other viruses. If result is PRESUMPTIVE POSTIVE SARS-CoV-2 nucleic acids MAY BE PRESENT.   A presumptive positive result was obtained on the submitted specimen  and confirmed on repeat testing.  While 2019 novel coronavirus  (SARS-CoV-2) nucleic acids may be present in the submitted sample  additional confirmatory testing may be necessary for epidemiological  and / or clinical management purposes  to  differentiate between  SARS-CoV-2 and other Sarbecovirus currently known to infect humans.  If clinically indicated additional testing with an alternate test  methodology [phone]) is advised. The SARS-CoV-2 RNA is generally  detectable in upper and lower respiratory sp ecimens during the acute  phase of infection. The expected result is Negative. Fact Sheet for Patients:  StrictlyIdeas.no Fact Sheet for Healthcare Providers: BankingDealers.co.za This test is not yet approved or cleared by the [place] FDA and has been authorized for detection and/or diagnosis of SARS-CoV-2 by FDA under an Emergency Use Authorization (EUA).  This EUA will remain in effect (meaning this test can be used) for the duration of the COVID-19 declaration under Section 564(b)(1) of the Act, 21 U.S.C. section 360bbb-3(b)(1),  unless the authorization is terminated or revoked sooner. Performed at [facility], [place] [address] [place], [place] [address]   Surgical PCR screen     Status: None   Collection Time: 04/11/19  9:29 PM   Specimen: Nasal Mucosa; Nasal Swab  Result Value Ref Range Status   MRSA, PCR NEGATIVE NEGATIVE Final   Staphylococcus aureus NEGATIVE NEGATIVE Final    Comment: (NOTE) The Xpert SA Assay (FDA approved for NASAL specimens in patients 47 years of age and older), is one component of a comprehensive surveillance program. It is not intended to diagnose infection nor to guide or monitor treatment. Performed at [facility], [place] [address] [place], [place] [address]          Radiology Studies: Pelvis Portable  Result Date: 04/13/2019 CLINICAL DATA:  Postop left femur fracture EXAM: PORTABLE PELVIS 1-2 VIEWS COMPARISON:  04/11/2019, 04/13/2019 FINDINGS: Numerous surgical clips within the pelvis. Interval intramedullary rod and distal screw fixation of the left femur for comminuted  intertrochanteric fracture. Hardware appears intact. Displaced lesser trochanteric fracture fragment. Gas in the soft tissues. IMPRESSION: Interval intramedullary rod and screw fixation of the left femur for comminuted intertrochanteric fracture. Electronically Signed   By: Donavan Foil M.D.   On: 04/13/2019 17:06   Dg C-arm 1-60 Min-no Report  Result Date: 04/13/2019 Fluoroscopy was utilized by the requesting physician.  No radiographic interpretation.   Dg Hip Operative Unilat W Or W/o Pelvis Left  Result Date: 04/13/2019 CLINICAL DATA:  Intramedullary nail left hip fracture. EXAM: OPERATIVE left HIP (WITH PELVIS IF PERFORMED) 3 VIEWS TECHNIQUE: Fluoroscopic spot image(s) were submitted for interpretation post-operatively. COMPARISON:  04/11/2019 FINDINGS: Exam demonstrates fixation of patient's left femoral trochanteric fracture with intramedullary nail and associated compression screw bridging the fracture site into the femoral head. Hardware is intact with anatomic alignment over the fracture site. Remainder of the exam is unchanged. IMPRESSION: Fixation of left femoral trochanteric fracture with hardware intact and anatomic alignment over the fracture site. Electronically Signed   By: Marin Olp M.D.   On: 04/13/2019 15:50        Scheduled Meds:  amLODipine  5 mg Oral Daily   docusate sodium  100 mg Oral BID   enoxaparin (LOVENOX) injection  40 mg Subcutaneous Q24H   irbesartan  300 mg Oral Daily   levothyroxine  75 mcg Oral Q0600   senna  1 tablet Oral BID   simvastatin  5 mg Oral QODAY   Continuous Infusions:  sodium chloride 250 mL (04/13/19 1755)     LOS: 4 days    Time spent: 28 minutes    Caron Ode J [place] DO [facility] Pager [phone]  If 7PM-7AM, please contact night-coverage [email or web address] Password [facility] 04/15/2019, 12:17 PM

## 2019-04-15 NOTE — TOC Initial Note (Addendum)
Transition of Care [facility]) - Initial/Assessment Note    Patient Details  Name: Cheryl Maldonado MRN: [id number] Date of Birth: [birth year]  Transition of Care [facility]) CM/SW Contact:    Erenest Rasher, RN Phone Number: 04/15/2019, 10:31 AM  Clinical Narrative:                  Spoke to son and is requesting [place] for [facility] and 3n1 bedside commode. Contacted [place] rep and [employer] rep with new referral. States he will pick up pt tomorrow. Plans to pick up meds from pharmacy today, notified attending to escribe to [facility]. He has a 24 hour private duty caregiver with [employer].    Expected Discharge Plan: [place] Barriers to Discharge: Continued Medical Work up   Patient Goals and CMS Choice Patient states their goals for this hospitalization and ongoing recovery are:: to be able to ambulate and have increased mobility CMS Medicare.gov Compare Post Acute Care list provided to:: Patient Choice offered to / list presented to : Adult Children(son, Advice worker)  Expected Discharge Plan and Services Expected Discharge Plan: [place]   Discharge Planning Services: CM Consult Post Acute Care Choice: [place] arrangements for the past 2 months: [place]                 DME Arranged: 3-N-1 DME Agency: [employer] Date DME Agency Contacted: 04/15/19 Time DME Agency Contacted: [id number] Representative spoke with at DME Agency: [place]: RN, PT [facility] Agency: [place] ([place]) Date [place]: 04/15/19 Time [place]: [id number] Representative spoke with at [place]: Floydene Flock  Prior Living Arrangements/Services Living arrangements for the past 2 months: [place] with:: Adult Children Patient language and need for interpreter reviewed:: Yes Do you feel safe going back to the place where you live?: Yes      Need for Family Participation in Patient Care: Yes  (Comment) Care giver support system in place?: Yes (comment) Current home services: DME, Homehealth aide(aide, Rolling Walker, wheelchair) Criminal Activity/Legal Involvement Pertinent to Current Situation/Hospitalization: No - Comment as needed  Activities of Daily Living Home Assistive Devices/Equipment: None ADL Screening (condition at time of admission) Patient's cognitive ability adequate to safely complete daily activities?: Yes Is the patient deaf or have difficulty hearing?: No Does the patient have difficulty seeing, even when wearing glasses/contacts?: No Does the patient have difficulty concentrating, remembering, or making decisions?: No Patient able to express need for assistance with ADLs?: Yes Does the patient have difficulty dressing or bathing?: Yes Independently performs ADLs?: No Communication: Independent Dressing (OT): Needs assistance(due to hip fracture) Is this a change from baseline?: Change from baseline, expected to last <3days Grooming: Needs assistance Is this a change from baseline?: Change from baseline, expected to last <3 days Feeding: Independent Bathing: Needs assistance Is this a change from baseline?: Change from baseline, expected to last <3 days Toileting: Needs assistance Is this a change from baseline?: Change from baseline, expected to last <3 days In/Out Bed: Needs assistance Is this a change from baseline?: Change from baseline, expected to last <3 days Walks in Home: Independent Does the patient have difficulty walking or climbing stairs?: Yes(current hip fracture) Weakness of Legs: None Weakness of Arms/Hands: None  Permission Sought/Granted Permission sought to share information with : Case Manager, PCP, Family Supports Permission granted to share information with : Yes, Verbal Permission Granted  Share Information with NAME:  Mark Mehrer  Permission granted to share info w AGENCY: [place] granted to share info  w Relationship: son  Permission granted to share info w Contact Information: [phone]  Emotional Assessment Appearance:: Appears stated age Attitude/Demeanor/Rapport: Engaged Affect (typically observed): Accepting Orientation: : Oriented to Self, Oriented to Place, Oriented to  Time, Oriented to Situation Alcohol / Substance Use: Never Used Psych Involvement: No (comment)  Admission diagnosis:  Acute cystitis without hematuria [N30.00] Closed displaced intertrochanteric fracture of left femur, initial encounter ([place]) [S72.142A] Fall, initial encounter [W19.XXXA] Covid-19 Virus not Detected [IMO0001] Intertrochanteric fracture of left hip [facility]) [S72.142A] Patient Active Problem List   Diagnosis Date Noted  . Closed comminuted intertrochanteric fracture of left femur ([place]) 04/13/2019  . Intertrochanteric fracture ([place]) 04/11/2019  . Acute lower UTI 04/11/2019  . Essential hypertension 04/11/2019  . Anemia 04/11/2019  . Intertrochanteric fracture of left hip ([place]) 04/11/2019   PCP:  Thressa Sheller, MD (Inactive) Pharmacy:   [facility], [place] [address] Phone: [phone] Fax: [phone]     Social Determinants of Health (SDOH) Interventions    Readmission Risk Interventions No flowsheet data found.

## 2019-04-15 NOTE — Progress Notes (Signed)
Cheryl Maldonado  MRN: [id number] DOB/Age: [birth year] 85 y.o. [facility] Procedure: Procedure(s) (LRB): INTRAMEDULLARY (IM) NAIL FEMORAL (Left)     Subjective: Just got back to bed after sitting in chair for a few hours. Minimal pain  Vital Signs Temp:  [97.9 F (36.6 C)-99 F (37.2 C)] 99 F (37.2 C) (06/13 0536) Pulse Rate:  [73-79] 73 (06/13 0536) Resp:  [14-16] 16 (06/13 0536) BP: (108-115)/(48-54) 113/50 (06/13 0536) SpO2:  [97 %-98 %] 97 % (06/13 0536)  Lab Results Recent Labs    04/14/19 0658 04/15/19 0456  WBC 9.1 8.9  HGB 10.6* 10.2*  HCT 32.9* 31.7*  PLT 301 328   BMET Recent Labs    04/14/19 0658 04/15/19 0456  NA 132* 136  K 3.9 3.8  CL 100 103  CO2 22 27  GLUCOSE 127* 91  BUN 12 18  CREATININE 0.68 0.67  CALCIUM 7.9* 8.2*   INR  Date Value Ref Range Status  04/11/2019 0.9 0.8 - 1.2 Final    Comment:    (NOTE) INR goal varies based on device and disease states. Performed at [facility] [place] [address], [place], [place] [address]      Exam Left hip and thigh soft. drssings dry NVI        Plan Cont therapy, patient desires DC home with [facility] FU with Dr. Lyla Glassing as outpatient after DC WBAT with walker DVT ppx: Lovenox in house --> d/c on ASA 81 mg PO BID, SCDs, TEDS PO pain control PT/OT Dispo: D/C planning  Jenetta Loges PA-C  04/15/2019, 10:23 AM Contact # [phone]

## 2019-04-15 NOTE — Plan of Care (Signed)
Pt denied that pain meds were needed. Pt compliant with Q 2 repositioning.

## 2019-04-16 NOTE — Progress Notes (Signed)
Physical Therapy Treatment Patient Details Name: Cheryl Maldonado MRN: [id number] DOB: Sep 14, 1933 Today's Date: 04/16/2019    History of Present Illness 85 y.o. female,w hypertension, osteoporosis, h/o breast/uterine cancer apparently presents with c/o mechanical fall about 1 week ago, tripped over drawer and sustained acute comminuted displaced left intratrochanteric fracture.  Pt now s/p L femur IM nail on 04/13/19    PT Comments    Pt scheduled to d/c today and from PT point this is safe since she will have 24 hour S.  She will need someone with her when she ambulates and she verbalized understanding.  She declined stair training stating that her sons are planning on picking her up to get her into the house as this is what they did after the fx, but before surgery.  Recommend HHPT.   Follow Up Recommendations  Home health PT;Supervision/Assistance - 24 hour     Equipment Recommendations  Other (comment)(equipment has been delivered)    Recommendations for Other Services       Precautions / Restrictions Precautions Precautions: Fall Restrictions Weight Bearing Restrictions: No    Mobility  Bed Mobility Overal bed mobility: Needs Assistance Bed Mobility: Supine to Sit     Supine to sit: Min assist     General bed mobility comments: Pt able to move legs to edge, but needed MIN A to get trunk upright  Transfers Overall transfer level: Needs assistance Equipment used: Rolling walker (2 wheeled) Transfers: Sit to/from Stand Sit to Stand: Min assist         General transfer comment: Stood from toilet and bed with MIN A to power up and to get COG over BOS.  Ambulation/Gait Ambulation/Gait assistance: Min assist Gait Distance (Feet): 20 Feet(x2) Assistive device: Rolling walker (2 wheeled) Gait Pattern/deviations: Step-to pattern;Decreased stance time - left;Antalgic Gait velocity: decreased   General Gait Details: Cues for proper sequencing and for RW placement and how  to stay within the RW for safety due to tendency to have posterior lean   Stairs             Wheelchair Mobility    Modified Rankin (Stroke Patients Only)       Balance           Standing balance support: Bilateral upper extremity supported Standing balance-Leahy Scale: Poor Standing balance comment: requires UE support                            Cognition Arousal/Alertness: Awake/alert Behavior During Therapy: WFL for tasks assessed/performed Overall Cognitive Status: Within Functional Limits for tasks assessed Area of Impairment: Memory                     Memory: Decreased short-term memory                Exercises General Exercises - Lower Extremity Ankle Circles/Pumps: Both;10 reps Quad Sets: 5 reps;Supine Heel Slides: Left Hip ABduction/ADduction: Left    General Comments General comments (skin integrity, edema, etc.): Issued illustrated hand out and pt did several reps of each exercise.  Instructions also written on the top for proper sequencing with gait.  Pt states her sons are going to pick her up to get into the house and she doesn't need to practice stairs.      Pertinent Vitals/Pain Pain Assessment: 0-10 Pain Score: 3  Pain Location: L hip with movement Pain Descriptors / Indicators: Operative site guarding;Sore Pain Intervention(s): Limited activity  within patient's tolerance;Monitored during session;Repositioned    Home Living                      Prior Function            PT Goals (current goals can now be found in the care plan section) Acute Rehab PT Goals PT Goal Formulation: With patient Time For Goal Achievement: 04/21/19 Potential to Achieve Goals: Good Progress towards PT goals: Progressing toward goals    Frequency    Min 5X/week      PT Plan Current plan remains appropriate    Co-evaluation              AM-PAC PT "6 Clicks" Mobility   Outcome Measure  Help needed  turning from your back to your side while in a flat bed without using bedrails?: A Little Help needed moving from lying on your back to sitting on the side of a flat bed without using bedrails?: A Little Help needed moving to and from a bed to a chair (including a wheelchair)?: A Little Help needed standing up from a chair using your arms (e.g., wheelchair or bedside chair)?: A Little Help needed to walk in hospital room?: A Little Help needed climbing 3-5 steps with a railing? : A Lot 6 Click Score: 17    End of Session Equipment Utilized During Treatment: Gait belt Activity Tolerance: Patient tolerated treatment well Patient left: in chair;with call bell/phone within reach;with chair alarm set Nurse Communication: Mobility status PT Visit Diagnosis: Other abnormalities of gait and mobility (R26.89);Unsteadiness on feet (R26.81)     Time: 1157-2620 PT Time Calculation (min) (ACUTE ONLY): 36 min  Charges:  $Gait Training: 8-22 mins $Therapeutic Exercise: 8-22 mins                     Titus Drone L. Tamala Julian, [place] Pager [phone] 04/16/2019    [name] 04/16/2019, 11:03 AM

## 2019-04-16 NOTE — Progress Notes (Signed)
Subjective: 3 Days Post-Op Procedure(s) (LRB): INTRAMEDULLARY (IM) NAIL FEMORAL (Left) Patient reports pain as mild in the left hip. Patient is sitting in the recliner, preparing to discharge home. She states she is doing well today.  Objective: Vital signs in last 24 hours: Temp:  [98.2 F (36.8 C)-98.6 F (37 C)] 98.5 F (36.9 C) (06/14 0525) Pulse Rate:  [75-81] 81 (06/14 0525) Resp:  [15-18] 18 (06/14 0525) BP: (100-138)/(52-58) 138/58 (06/14 0525) SpO2:  [95 %-98 %] 96 % (06/14 0525)  Intake/Output from previous day: 06/13 0701 - 06/14 0700 In: 120 [P.O.:120] Out: -  Intake/Output this shift: No intake/output data recorded.  Recent Labs    04/14/19 0658 04/15/19 0456  HGB 10.6* 10.2*   Recent Labs    04/14/19 0658 04/15/19 0456  WBC 9.1 8.9  RBC 3.49* 3.34*  HCT 32.9* 31.7*  PLT 301 328   Recent Labs    04/14/19 0658 04/15/19 0456  NA 132* 136  K 3.9 3.8  CL 100 103  CO2 22 27  BUN 12 18  CREATININE 0.68 0.67  GLUCOSE 127* 91  CALCIUM 7.9* 8.2*   No results for input(s): LABPT, INR in the last 72 hours.  Neurologically intact Sensation intact distally Intact pulses distally Dorsiflexion/Plantar flexion intact Dressing C/D/I.  Assessment/Plan: 3 Days Post-Op Procedure(s) (LRB): INTRAMEDULLARY (IM) NAIL FEMORAL (Left)  Plan for discharge home this morning with HHPT. Left hip dressing to remain in place until POD #4. Follow up in the office with Dr. Lyla Glassing as scheduled.   Griffith Citron, PA-C Orthopedic Surgery [facility] [place]

## 2019-04-16 NOTE — Discharge Summary (Signed)
Physician Discharge Summary  Cheryl Maldonado OXB:[id number] DOB: 1933/02/03 DOA: 04/11/2019  PCP: Thressa Sheller, MD (Inactive)  Admit date: 04/11/2019 Discharge date: 04/16/2019  Admitted From: Home Disposition:  Home  Recommendations for Outpatient Follow-up:  1. Follow up with PCP in 1 week 2. Follow-up with orthopedics, Dr. Lyla Glassing in 2 weeks   Home Health: Yes, PT/RN Equipment/Devices: Gilford Rile (patient already has at home per son), 3 in 1 bedside commode  Discharge Condition: Stable CODE STATUS: Full code Diet recommendation: Heart Healthy  History of present illness:  Cheryl Maldonado a85 y.o.female,w hypertension, osteoporosis, h/o breast/uterine cancer apparently presents with c/o mechanical fall about 1 week ago, tripped over drawer. Pt noted left hip pain and it became severe enough where unable to walk.   In ED, T98.3 P 92 R 16 Bp 131/64 Pox 99% on RA.  Left hip x-ray noted for acute comminuted displaced left intratrochanteric fracture.  Chest x-ray no active cardiopulmonary disease.  Urinalysis with large leukoesterase, positive nitrite, 25-50 WBCs. Na 138, K 4.3, Bun 25, Creatinine 0.76. Wbc 12.4, Hgb 11.2, Plt 348.  INR 1.0.  Pt was admitted under the hospital service following mechanical fall with left intratrochanteric fracture and UTI.  Orthopedics plans on surgical intervention.  Hospital course:  Comminuted, displaced L intertrochanteric fracture Patient presenting after a mechanical fall roughly 1 week prior to admission with progressive leg pain and inability to walk.  X-ray left femur notable for acute intertrochanteric fracture with displacement.  Underwent IM nail to left intertrochanteric hip fracture on 04/14/2019 by Dr. Delfino Lovett.  Patient was evaluated by physical therapy with recommendations of home health with 24-hour care versus SNF.  Patient and son prefer home health given he has set up 24-hour care for at home.  Patient is to remain weightbearing as  tolerated to left lower extremity with use of a walker.  Patient will continue DVT prophylaxis outpatient with aspirin 81 mg p.o. twice daily per orthopedic guidelines.  Patient to follow-up with orthopedics, Dr. Lyla Glassing in 2 weeks.   Acute cystitis without hematuria E. coli UTI Urinalysis with positive nitrite, large leukoesterase and 25-50 WBCs. Urine culture notable for E. coli with pan susceptibility.  Completed 3-day course of ceftriaxone.  Essential hypertension Blood pressure well controlled, continue amlodipine 5 mg p.o. daily, irbesartan 300 mg p.o. daily  Hyperlipidemia: Continue Zocor   Hypothyroidism: TSH 4.293.  Continue levothyroxine 75 mcg p.o. daily  Anxiety: Continue Atarax 10 mg p.o. 3 times daily as needed  Anemia Hemoglobin 11.2 on admission with MCV of 100.3.  Iron 53, TIBC 231, ferritin 36.  Vitamin B12 1467.  Did not require any blood transfusions and hemoglobin remained stable and was 10.2 at time of discharge.   Discharge Diagnoses:  Principal Problem:   Intertrochanteric fracture ([place]) Active Problems:   Essential hypertension   Anemia   Intertrochanteric fracture of left hip (HCC)   Closed comminuted intertrochanteric fracture of left femur [facility])    Discharge Instructions  Discharge Instructions    Call MD for:  difficulty breathing, headache or visual disturbances   Complete by: As directed    Call MD for:  extreme fatigue   Complete by: As directed    Call MD for:  persistant dizziness or light-headedness   Complete by: As directed    Call MD for:  persistant nausea and vomiting   Complete by: As directed    Call MD for:  redness, tenderness, or signs of infection (pain, swelling, redness, odor or green/yellow discharge around incision site)  Complete by: As directed    Call MD for:  severe uncontrolled pain   Complete by: As directed    Call MD for:  temperature >100.4   Complete by: As directed    Diet - low sodium heart healthy    Complete by: As directed    Increase activity slowly   Complete by: As directed      Allergies as of 04/16/2019   No Known Allergies     Medication List    TAKE these medications   amLODipine 5 MG tablet Commonly known as: NORVASC Take 5 mg by mouth daily.   aspirin 81 MG chewable tablet Commonly known as: Aspirin Childrens Chew 1 tablet (81 mg total) by mouth 2 (two) times daily with a meal.   cholecalciferol 25 MCG (1000 UT) tablet Commonly known as: VITAMIN D3 Take 1,000 Units by mouth daily.   docusate sodium 100 MG capsule Commonly known as: COLACE Take 1 capsule (100 mg total) by mouth 2 (two) times daily for 30 days.   HYDROcodone-acetaminophen 5-325 MG tablet Commonly known as: NORCO/VICODIN Take 1 tablet by mouth every 4 (four) hours as needed for moderate pain (pain score 4-6).   irbesartan 300 MG tablet Commonly known as: AVAPRO Take 300 mg by mouth daily.   levothyroxine 75 MCG tablet Commonly known as: SYNTHROID Take 75 mcg by mouth daily before breakfast.   Multivitamin Adult Tabs Take 1 tablet by mouth daily.   raloxifene 60 MG tablet Commonly known as: EVISTA Take 60 mg by mouth daily.   simvastatin 5 MG tablet Commonly known as: ZOCOR Take 5 mg by mouth every other day.            Durable Medical Equipment  (From admission, onward)         Start     Ordered   04/15/19 1012  For home use only DME 3 n 1  Once     04/15/19 1011         Follow-up Information    Swinteck, Aaron Edelman, MD. Schedule an appointment as soon as possible for a visit in 2 weeks.   Specialty: Orthopedic Surgery Why: For wound re-check Contact information: [address] [place] [address] [phone]        [facility] Follow up.   Why: Home Health Physical Therapy, RN-agency will call to arrange initial appointment Contact information: [phone]       Thressa Sheller, MD. Call in 1 week(s).   Specialty: Internal  Medicine Contact information: [place] [place] [address] [phone]          No Known Allergies  Consultations:  [facility], Dr. Lyla Glassing   Procedures/Studies: Dg Chest 2 View  Result Date: 04/11/2019 CLINICAL DATA:  Fall 6 days ago EXAM: CHEST - 2 VIEW COMPARISON:  03/04/2010 FINDINGS: Postsurgical changes of left breast/chest. No acute airspace disease or effusion. Normal heart size. No pneumothorax. Mild scoliosis of the spine. IMPRESSION: No active cardiopulmonary disease. Electronically Signed   By: Donavan Foil M.D.   On: 04/11/2019 18:47   Dg Pelvis 1-2 Views  Result Date: 04/11/2019 CLINICAL DATA:  Fall with left hip pain EXAM: PELVIS - 1-2 VIEW COMPARISON:  CT 03/02/2010 FINDINGS: Extensive surgical clips in the pelvis. Right femoral head projects in joint. Pubic symphysis and rami appear intact. Acute comminuted and displaced left intertrochanteric fracture. Left femoral head projects in joint. IMPRESSION: Acute comminuted and displaced left intertrochanteric fracture Electronically Signed  By: Donavan Foil M.D.   On: 04/11/2019 18:48   Pelvis Portable  Result Date: 04/13/2019 CLINICAL DATA:  Postop left femur fracture EXAM: PORTABLE PELVIS 1-2 VIEWS COMPARISON:  04/11/2019, 04/13/2019 FINDINGS: Numerous surgical clips within the pelvis. Interval intramedullary rod and distal screw fixation of the left femur for comminuted intertrochanteric fracture. Hardware appears intact. Displaced lesser trochanteric fracture fragment. Gas in the soft tissues. IMPRESSION: Interval intramedullary rod and screw fixation of the left femur for comminuted intertrochanteric fracture. Electronically Signed   By: Donavan Foil M.D.   On: 04/13/2019 17:06   Ct Hip Left Wo Contrast  Result Date: 04/11/2019 CLINICAL DATA:  Left intertrochanteric femur fracture. EXAM: CT OF THE LEFT HIP WITHOUT CONTRAST TECHNIQUE: Multidetector CT imaging of the left hip was performed  according to the standard protocol. Multiplanar CT image reconstructions were also generated. COMPARISON:  Pelvic x-rays from same day. CT abdomen pelvis dated Mar 02, 2010. FINDINGS: Bones/Joint/Cartilage Again seen is an acute comminuted, displaced, impacted, and angulated left intertrochanteric femur fracture. No additional fracture. No dislocation. The left hip joint space is relatively preserved. No joint effusion. Osteopenia. Ligaments Suboptimally assessed by CT. Muscles and Tendons Edematous and mildly enlarged proximal adductor brevis muscle. Gluteus medius and minimus muscle atrophy. Soft tissues Small amount of hematoma surrounding the fracture. Left lateral hip superficial soft tissue swelling. No soft tissue mass or fluid collection. IMPRESSION: 1. Acute comminuted and displaced left intertrochanteric femur fracture. Electronically Signed   By: Titus Dubin M.D.   On: 04/11/2019 20:41   Dg C-arm 1-60 Min-no Report  Result Date: 04/13/2019 Fluoroscopy was utilized by the requesting physician.  No radiographic interpretation.   Dg Hip Operative Unilat W Or W/o Pelvis Left  Result Date: 04/13/2019 CLINICAL DATA:  Intramedullary nail left hip fracture. EXAM: OPERATIVE left HIP (WITH PELVIS IF PERFORMED) 3 VIEWS TECHNIQUE: Fluoroscopic spot image(s) were submitted for interpretation post-operatively. COMPARISON:  04/11/2019 FINDINGS: Exam demonstrates fixation of patient's left femoral trochanteric fracture with intramedullary nail and associated compression screw bridging the fracture site into the femoral head. Hardware is intact with anatomic alignment over the fracture site. Remainder of the exam is unchanged. IMPRESSION: Fixation of left femoral trochanteric fracture with hardware intact and anatomic alignment over the fracture site. Electronically Signed   By: Marin Olp M.D.   On: 04/13/2019 15:50   Dg Femur Min 2 Views Left  Result Date: 04/11/2019 CLINICAL DATA:  Fall with hip pain  EXAM: LEFT FEMUR 2 VIEWS COMPARISON:  None. FINDINGS: Acute comminuted and displaced left intertrochanteric fracture. Left femoral head projects in joint. Mid to distal femur appear intact. IMPRESSION: Acute comminuted left intertrochanteric fracture Electronically Signed   By: Donavan Foil M.D.   On: 04/11/2019 18:49      Subjective: Patient seen and examined at bedside this morning, resting comfortably.  Awaiting breakfast.  Pain controlled.  Ready for discharge home.  No complaints.  Denies headache, no fever/chills/night sweats, no visual changes, no chest pain, no palpitations, no shortness of breath, no abdominal pain, no cough/congestion, no nausea/vomiting/diarrhea.  No acute events overnight per nursing staff.   Discharge Exam: Vitals:   04/15/19 2041 04/16/19 0525  BP: (!) 121/52 (!) 138/58  Pulse: 75 81  Resp: 18 18  Temp: 98.6 F (37 C) 98.5 F (36.9 C)  SpO2: 95% 96%   Vitals:   04/15/19 0536 04/15/19 1440 04/15/19 2041 04/16/19 0525  BP: (!) 113/50 (!) 100/57 (!) 121/52 (!) 138/58  Pulse: 73 78 75  81  Resp: 16 15 18 18   Temp: 99 F (37.2 C) 98.2 F (36.8 C) 98.6 F (37 C) 98.5 F (36.9 C)  TempSrc: Oral Oral Oral Oral  SpO2: 97% 98% 95% 96%  Weight:      Height:        General: Pt is alert, awake, not in acute distress Cardiovascular: RRR, S1/S2 +, no rubs, no gallops Respiratory: CTA bilaterally, no wheezing, no rhonchi Abdominal: Soft, NT, ND, bowel sounds + Extremities: no edema, no cyanosis, left hip dressing noted, clean/dry/intact    The results of significant diagnostics from this hospitalization (including imaging, microbiology, ancillary and laboratory) are listed below for reference.     Microbiology: Recent Results (from the past 240 hour(s))  Urine culture     Status: Abnormal   Collection Time: 04/11/19  5:51 PM   Specimen: Urine, Clean Catch  Result Value Ref Range Status   Specimen Description   Final    URINE, CLEAN CATCH Performed  at [facility], [place] [address], [place], [place] [address]    Special Requests   Final    NONE Performed at [facility], [place] [address] [place], [place] [address]    Culture >=100,000 COLONIES/mL ESCHERICHIA COLI (A)  Final   Report Status 04/14/2019 FINAL  Final   Organism ID, Bacteria ESCHERICHIA COLI (A)  Final      Susceptibility   Escherichia coli - MIC*    AMPICILLIN <=2 SENSITIVE Sensitive     CEFAZOLIN <=4 SENSITIVE Sensitive     CEFTRIAXONE <=1 SENSITIVE Sensitive     CIPROFLOXACIN <=0.25 SENSITIVE Sensitive     GENTAMICIN <=1 SENSITIVE Sensitive     IMIPENEM <=0.25 SENSITIVE Sensitive     NITROFURANTOIN <=16 SENSITIVE Sensitive     TRIMETH/SULFA <=20 SENSITIVE Sensitive     AMPICILLIN/SULBACTAM <=2 SENSITIVE Sensitive     PIP/TAZO <=4 SENSITIVE Sensitive     Extended ESBL NEGATIVE Sensitive     * >=100,000 COLONIES/mL ESCHERICHIA COLI  SARS Coronavirus 2 (CEPHEID - Performed in [place] hospital lab), Hosp Order     Status: None   Collection Time: 04/11/19  6:03 PM   Specimen: Nasopharyngeal Swab  Result Value Ref Range Status   SARS Coronavirus 2 NEGATIVE NEGATIVE Final    Comment: (NOTE) If result is NEGATIVE SARS-CoV-2 target nucleic acids are NOT DETECTED. The SARS-CoV-2 RNA is generally detectable in upper and lower  respiratory specimens during the acute phase of infection. The lowest  concentration of SARS-CoV-2 viral copies this assay can detect is 250  copies / mL. A negative result does not preclude SARS-CoV-2 infection  and should not be used as the sole basis for treatment or other  patient management decisions.  A negative result may occur with  improper specimen collection / handling, submission of specimen other  than nasopharyngeal swab, presence of viral mutation(s) within the  areas targeted by this assay, and inadequate number of viral copies  (<250 copies / mL). A negative result must be combined with  clinical  observations, patient history, and epidemiological information. If result is POSITIVE SARS-CoV-2 target nucleic acids are DETECTED. The SARS-CoV-2 RNA is generally detectable in upper and lower  respiratory specimens dur ing the acute phase of infection.  Positive  results are indicative of active infection with SARS-CoV-2.  Clinical  correlation with patient history and other diagnostic information is  necessary to determine patient infection status.  Positive results do  not rule out bacterial  infection or co-infection with other viruses. If result is PRESUMPTIVE POSTIVE SARS-CoV-2 nucleic acids MAY BE PRESENT.   A presumptive positive result was obtained on the submitted specimen  and confirmed on repeat testing.  While 2019 novel coronavirus  (SARS-CoV-2) nucleic acids may be present in the submitted sample  additional confirmatory testing may be necessary for epidemiological  and / or clinical management purposes  to differentiate between  SARS-CoV-2 and other Sarbecovirus currently known to infect humans.  If clinically indicated additional testing with an alternate test  methodology [phone]) is advised. The SARS-CoV-2 RNA is generally  detectable in upper and lower respiratory sp ecimens during the acute  phase of infection. The expected result is Negative. Fact Sheet for Patients:  StrictlyIdeas.no Fact Sheet for Healthcare Providers: BankingDealers.co.za This test is not yet approved or cleared by the [place] FDA and has been authorized for detection and/or diagnosis of SARS-CoV-2 by FDA under an Emergency Use Authorization (EUA).  This EUA will remain in effect (meaning this test can be used) for the duration of the COVID-19 declaration under Section 564(b)(1) of the Act, 21 U.S.C. section 360bbb-3(b)(1), unless the authorization is terminated or revoked sooner. Performed at [facility],  [place] [address] [place], [place] [address]   Surgical PCR screen     Status: None   Collection Time: 04/11/19  9:29 PM   Specimen: Nasal Mucosa; Nasal Swab  Result Value Ref Range Status   MRSA, PCR NEGATIVE NEGATIVE Final   Staphylococcus aureus NEGATIVE NEGATIVE Final    Comment: (NOTE) The Xpert SA Assay (FDA approved for NASAL specimens in patients 8 years of age and older), is one component of a comprehensive surveillance program. It is not intended to diagnose infection nor to guide or monitor treatment. Performed at [facility], [place] [address] [place], [place] [address]      Labs: BNP (last 3 results) No results for input(s): BNP in the last 8760 hours. Basic Metabolic Panel: Recent Labs  Lab 04/11/19 1830 04/12/19 0702 04/13/19 0811 04/14/19 0658 04/15/19 0456  NA 138 138 136 132* 136  K 4.3 3.8 3.9 3.9 3.8  CL 105 106 105 100 103  CO2 24 23 23 22 27   GLUCOSE 116* 96 96 127* 91  BUN 25* 21 17 12 18   CREATININE 0.76 0.68 0.69 0.68 0.67  CALCIUM 9.3 8.4* 8.0* 7.9* 8.2*   Liver Function Tests: Recent Labs  Lab 04/12/19 0702  AST 21  ALT 17  ALKPHOS 40  BILITOT 0.6  PROT 5.1*  ALBUMIN 3.0*   No results for input(s): LIPASE, AMYLASE in the last 168 hours. No results for input(s): AMMONIA in the last 168 hours. CBC: Recent Labs  Lab 04/11/19 1830 04/12/19 0702 04/13/19 0811 04/14/19 0658 04/15/19 0456  WBC 12.4* 7.6 8.8 9.1 8.9  NEUTROABS 10.0*  --   --   --   --   HGB 11.2* 9.2* 8.6* 10.6* 10.2*  HCT 34.9* 29.2*  28.6* 28.4* 32.9* 31.7*  MCV 100.3* 101.0* 102.2* 94.3 94.9  PLT 348 336 326 301 328   Cardiac Enzymes: No results for input(s): CKTOTAL, CKMB, CKMBINDEX, TROPONINI in the last 168 hours. BNP: Invalid input(s): POCBNP CBG: No results for input(s): GLUCAP in the last 168 hours. D-Dimer No results for input(s): DDIMER in the last 72 hours. Hgb A1c No results for input(s): HGBA1C in the last 72 hours. Lipid  Profile No results for input(s): CHOL, HDL, LDLCALC, TRIG, CHOLHDL, LDLDIRECT in  the last 72 hours. Thyroid function studies No results for input(s): TSH, T4TOTAL, T3FREE, THYROIDAB in the last 72 hours.  Invalid input(s): FREET3 Anemia work up No results for input(s): VITAMINB12, FOLATE, FERRITIN, TIBC, IRON, RETICCTPCT in the last 72 hours. Urinalysis    Component Value Date/Time   COLORURINE AMBER (A) 04/11/2019 1751   APPEARANCEUR HAZY (A) 04/11/2019 1751   LABSPEC 1.028 04/11/2019 1751   PHURINE 5.0 04/11/2019 1751   GLUCOSEU NEGATIVE 04/11/2019 1751   HGBUR NEGATIVE 04/11/2019 1751   BILIRUBINUR NEGATIVE 04/11/2019 1751   KETONESUR 5 (A) 04/11/2019 1751   PROTEINUR NEGATIVE 04/11/2019 1751   UROBILINOGEN 0.2 03/01/2010 1859   NITRITE POSITIVE (A) 04/11/2019 1751   LEUKOCYTESUR LARGE (A) 04/11/2019 1751   Sepsis Labs Invalid input(s): PROCALCITONIN,  WBC,  LACTICIDVEN Microbiology Recent Results (from the past 240 hour(s))  Urine culture     Status: Abnormal   Collection Time: 04/11/19  5:51 PM   Specimen: Urine, Clean Catch  Result Value Ref Range Status   Specimen Description   Final    URINE, CLEAN CATCH Performed at [facility], [place] [address], [place], [place] [address]    Special Requests   Final    NONE Performed at [facility], [place] [address], [place], [place] [address]    Culture >=100,000 COLONIES/mL ESCHERICHIA COLI (A)  Final   Report Status 04/14/2019 FINAL  Final   Organism ID, Bacteria ESCHERICHIA COLI (A)  Final      Susceptibility   Escherichia coli - MIC*    AMPICILLIN <=2 SENSITIVE Sensitive     CEFAZOLIN <=4 SENSITIVE Sensitive     CEFTRIAXONE <=1 SENSITIVE Sensitive     CIPROFLOXACIN <=0.25 SENSITIVE Sensitive     GENTAMICIN <=1 SENSITIVE Sensitive     IMIPENEM <=0.25 SENSITIVE Sensitive     NITROFURANTOIN <=16 SENSITIVE Sensitive     TRIMETH/SULFA <=20 SENSITIVE Sensitive     AMPICILLIN/SULBACTAM  <=2 SENSITIVE Sensitive     PIP/TAZO <=4 SENSITIVE Sensitive     Extended ESBL NEGATIVE Sensitive     * >=100,000 COLONIES/mL ESCHERICHIA COLI  SARS Coronavirus 2 (CEPHEID - Performed in [place] hospital lab), Hosp Order     Status: None   Collection Time: 04/11/19  6:03 PM   Specimen: Nasopharyngeal Swab  Result Value Ref Range Status   SARS Coronavirus 2 NEGATIVE NEGATIVE Final    Comment: (NOTE) If result is NEGATIVE SARS-CoV-2 target nucleic acids are NOT DETECTED. The SARS-CoV-2 RNA is generally detectable in upper and lower  respiratory specimens during the acute phase of infection. The lowest  concentration of SARS-CoV-2 viral copies this assay can detect is 250  copies / mL. A negative result does not preclude SARS-CoV-2 infection  and should not be used as the sole basis for treatment or other  patient management decisions.  A negative result may occur with  improper specimen collection / handling, submission of specimen other  than nasopharyngeal swab, presence of viral mutation(s) within the  areas targeted by this assay, and inadequate number of viral copies  (<250 copies / mL). A negative result must be combined with clinical  observations, patient history, and epidemiological information. If result is POSITIVE SARS-CoV-2 target nucleic acids are DETECTED. The SARS-CoV-2 RNA is generally detectable in upper and lower  respiratory specimens dur ing the acute phase of infection.  Positive  results are indicative of active infection with SARS-CoV-2.  Clinical  correlation with patient history and other diagnostic information  is  necessary to determine patient infection status.  Positive results do  not rule out bacterial infection or co-infection with other viruses. If result is PRESUMPTIVE POSTIVE SARS-CoV-2 nucleic acids MAY BE PRESENT.   A presumptive positive result was obtained on the submitted specimen  and confirmed on repeat testing.  While 2019 novel  coronavirus  (SARS-CoV-2) nucleic acids may be present in the submitted sample  additional confirmatory testing may be necessary for epidemiological  and / or clinical management purposes  to differentiate between  SARS-CoV-2 and other Sarbecovirus currently known to infect humans.  If clinically indicated additional testing with an alternate test  methodology [phone]) is advised. The SARS-CoV-2 RNA is generally  detectable in upper and lower respiratory sp ecimens during the acute  phase of infection. The expected result is Negative. Fact Sheet for Patients:  StrictlyIdeas.no Fact Sheet for Healthcare Providers: BankingDealers.co.za This test is not yet approved or cleared by the [place] FDA and has been authorized for detection and/or diagnosis of SARS-CoV-2 by FDA under an Emergency Use Authorization (EUA).  This EUA will remain in effect (meaning this test can be used) for the duration of the COVID-19 declaration under Section 564(b)(1) of the Act, 21 U.S.C. section 360bbb-3(b)(1), unless the authorization is terminated or revoked sooner. Performed at [facility], [place] [address], [place], [place] [address]   Surgical PCR screen     Status: None   Collection Time: 04/11/19  9:29 PM   Specimen: Nasal Mucosa; Nasal Swab  Result Value Ref Range Status   MRSA, PCR NEGATIVE NEGATIVE Final   Staphylococcus aureus NEGATIVE NEGATIVE Final    Comment: (NOTE) The Xpert SA Assay (FDA approved for NASAL specimens in patients 95 years of age and older), is one component of a comprehensive surveillance program. It is not intended to diagnose infection nor to guide or monitor treatment. Performed at [facility], [place] [address] [place],  [address]      Time coordinating discharge: Over 30 minutes  SIGNED:   Donnamarie Poag [place] DO  [facility] 04/16/2019, 9:18 AM

## 2019-04-16 NOTE — Plan of Care (Signed)
Plan of care reviewed. 

## 2019-04-17 ENCOUNTER — Encounter (HOSPITAL_COMMUNITY): Payer: Self-pay | Admitting: Orthopedic Surgery

## 2019-11-25 ENCOUNTER — Ambulatory Visit: Payer: Medicare Other

## 2019-11-27 ENCOUNTER — Ambulatory Visit: Payer: Self-pay | Attending: Internal Medicine

## 2019-11-27 DIAGNOSIS — Z23 Encounter for immunization: Secondary | ICD-10-CM | POA: Insufficient documentation

## 2019-11-27 NOTE — Progress Notes (Signed)
   Covid-19 Vaccination Clinic  Name:  Cheryl Maldonado    MRN: [id number] DOB: [birth year]  11/27/2019  [name] was observed post Covid-19 immunization for 15 minutes without incidence. She was provided with Vaccine Information Sheet and instruction to access the V-Safe system.   [name] was instructed to call 911 with any severe reactions post vaccine: Marland Kitchen Difficulty breathing  . Swelling of your face and throat  . A fast heartbeat  . A bad rash all over your body  . Dizziness and weakness    Immunizations Administered    Name Date Dose VIS Date Route   Pfizer COVID-19 Vaccine 11/27/2019  9:58 AM 0.3 mL 10/13/2019 Intramuscular   Manufacturer: [place]   Lot: [id number]   [place]: [id number]

## 2019-12-18 ENCOUNTER — Ambulatory Visit: Payer: Medicare PPO | Attending: Internal Medicine

## 2019-12-18 DIAGNOSIS — Z23 Encounter for immunization: Secondary | ICD-10-CM

## 2019-12-18 NOTE — Progress Notes (Signed)
   Covid-19 Vaccination Clinic  Name:  Cheryl Maldonado    MRN: [id number] DOB: [birth year]  12/18/2019  Cheryl Maldonado was observed post Covid-19 immunization for 15 minutes without incidence. She was provided with Vaccine Information Sheet and instruction to access the V-Safe system.   Cheryl Maldonado was instructed to call 911 with any severe reactions post vaccine: Marland Kitchen Difficulty breathing  . Swelling of your face and throat  . A fast heartbeat  . A bad rash all over your body  . Dizziness and weakness    Immunizations Administered    Name Date Dose VIS Date Route   Pfizer COVID-19 Vaccine 12/18/2019  9:38 AM 0.3 mL 10/13/2019 Intramuscular   Manufacturer: [place]   Lot: [id number]   [place]: [id number]

## 2020-04-14 DIAGNOSIS — S72142A Displaced intertrochanteric fracture of left femur, initial encounter for closed fracture: Secondary | ICD-10-CM | POA: Diagnosis not present

## 2020-05-27 DIAGNOSIS — Z1231 Encounter for screening mammogram for malignant neoplasm of breast: Secondary | ICD-10-CM | POA: Diagnosis not present

## 2020-12-05 ENCOUNTER — Emergency Department (HOSPITAL_COMMUNITY): Payer: Medicare PPO

## 2020-12-05 ENCOUNTER — Other Ambulatory Visit: Payer: Self-pay

## 2020-12-05 ENCOUNTER — Encounter (HOSPITAL_COMMUNITY): Payer: Self-pay | Admitting: Internal Medicine

## 2020-12-05 ENCOUNTER — Emergency Department (HOSPITAL_BASED_OUTPATIENT_CLINIC_OR_DEPARTMENT_OTHER): Payer: Medicare PPO

## 2020-12-05 ENCOUNTER — Inpatient Hospital Stay (HOSPITAL_COMMUNITY)
Admission: EM | Admit: 2020-12-05 | Discharge: 2020-12-08 | DRG: 522 | Disposition: A | Discharge status: Home Health | Payer: Medicare PPO | Attending: Family Medicine | Admitting: Family Medicine

## 2020-12-05 DIAGNOSIS — E039 Hypothyroidism, unspecified: Secondary | ICD-10-CM | POA: Diagnosis not present

## 2020-12-05 DIAGNOSIS — Z8542 Personal history of malignant neoplasm of other parts of uterus: Secondary | ICD-10-CM | POA: Diagnosis not present

## 2020-12-05 DIAGNOSIS — S72009A Fracture of unspecified part of neck of unspecified femur, initial encounter for closed fracture: Secondary | ICD-10-CM | POA: Diagnosis not present

## 2020-12-05 DIAGNOSIS — Z471 Aftercare following joint replacement surgery: Secondary | ICD-10-CM | POA: Diagnosis not present

## 2020-12-05 DIAGNOSIS — S72001A Fracture of unspecified part of neck of right femur, initial encounter for closed fracture: Secondary | ICD-10-CM | POA: Diagnosis not present

## 2020-12-05 DIAGNOSIS — W010XXA Fall on same level from slipping, tripping and stumbling without subsequent striking against object, initial encounter: Secondary | ICD-10-CM | POA: Diagnosis present

## 2020-12-05 DIAGNOSIS — S72031A Displaced midcervical fracture of right femur, initial encounter for closed fracture: Principal | ICD-10-CM | POA: Diagnosis present

## 2020-12-05 DIAGNOSIS — Z20822 Contact with and (suspected) exposure to covid-19: Secondary | ICD-10-CM | POA: Diagnosis not present

## 2020-12-05 DIAGNOSIS — E875 Hyperkalemia: Secondary | ICD-10-CM | POA: Diagnosis present

## 2020-12-05 DIAGNOSIS — D62 Acute posthemorrhagic anemia: Secondary | ICD-10-CM | POA: Diagnosis not present

## 2020-12-05 DIAGNOSIS — F0390 Unspecified dementia without behavioral disturbance: Secondary | ICD-10-CM | POA: Diagnosis present

## 2020-12-05 DIAGNOSIS — C50919 Malignant neoplasm of unspecified site of unspecified female breast: Secondary | ICD-10-CM | POA: Diagnosis present

## 2020-12-05 DIAGNOSIS — I10 Essential (primary) hypertension: Secondary | ICD-10-CM

## 2020-12-05 DIAGNOSIS — Z8781 Personal history of (healed) traumatic fracture: Secondary | ICD-10-CM

## 2020-12-05 DIAGNOSIS — Z66 Do not resuscitate: Secondary | ICD-10-CM | POA: Diagnosis present

## 2020-12-05 DIAGNOSIS — I959 Hypotension, unspecified: Secondary | ICD-10-CM | POA: Diagnosis not present

## 2020-12-05 DIAGNOSIS — S72011A Unspecified intracapsular fracture of right femur, initial encounter for closed fracture: Secondary | ICD-10-CM | POA: Diagnosis not present

## 2020-12-05 DIAGNOSIS — S72141A Displaced intertrochanteric fracture of right femur, initial encounter for closed fracture: Secondary | ICD-10-CM | POA: Diagnosis not present

## 2020-12-05 DIAGNOSIS — R0902 Hypoxemia: Secondary | ICD-10-CM | POA: Diagnosis not present

## 2020-12-05 DIAGNOSIS — Z419 Encounter for procedure for purposes other than remedying health state, unspecified: Secondary | ICD-10-CM

## 2020-12-05 DIAGNOSIS — Z853 Personal history of malignant neoplasm of breast: Secondary | ICD-10-CM | POA: Diagnosis not present

## 2020-12-05 DIAGNOSIS — R52 Pain, unspecified: Secondary | ICD-10-CM | POA: Diagnosis not present

## 2020-12-05 DIAGNOSIS — W19XXXA Unspecified fall, initial encounter: Secondary | ICD-10-CM | POA: Diagnosis not present

## 2020-12-05 DIAGNOSIS — Z043 Encounter for examination and observation following other accident: Secondary | ICD-10-CM | POA: Diagnosis not present

## 2020-12-05 DIAGNOSIS — Z79899 Other long term (current) drug therapy: Secondary | ICD-10-CM

## 2020-12-05 DIAGNOSIS — Z809 Family history of malignant neoplasm, unspecified: Secondary | ICD-10-CM

## 2020-12-05 DIAGNOSIS — D649 Anemia, unspecified: Secondary | ICD-10-CM | POA: Diagnosis not present

## 2020-12-05 DIAGNOSIS — M7989 Other specified soft tissue disorders: Secondary | ICD-10-CM | POA: Diagnosis not present

## 2020-12-05 DIAGNOSIS — E785 Hyperlipidemia, unspecified: Secondary | ICD-10-CM | POA: Diagnosis present

## 2020-12-05 DIAGNOSIS — Z96641 Presence of right artificial hip joint: Secondary | ICD-10-CM | POA: Diagnosis not present

## 2020-12-05 DIAGNOSIS — S79911A Unspecified injury of right hip, initial encounter: Secondary | ICD-10-CM | POA: Diagnosis present

## 2020-12-05 DIAGNOSIS — Z79890 Hormone replacement therapy (postmenopausal): Secondary | ICD-10-CM

## 2020-12-05 LAB — CBC WITH DIFFERENTIAL/PLATELET
Abs Immature Granulocytes: 0.06 10*3/uL (ref 0.00–0.07)
Basophils Absolute: 0 10*3/uL (ref 0.0–0.1)
Basophils Relative: 0 %
Eosinophils Absolute: 0 10*3/uL (ref 0.0–0.5)
Eosinophils Relative: 0 %
HCT: 28.8 % — ABNORMAL LOW (ref 36.0–46.0)
Hemoglobin: 8.9 g/dL — ABNORMAL LOW (ref 12.0–15.0)
Immature Granulocytes: 0 %
Lymphocytes Relative: 2 %
Lymphs Abs: 0.3 10*3/uL — ABNORMAL LOW (ref 0.7–4.0)
MCH: 25.4 pg — ABNORMAL LOW (ref 26.0–34.0)
MCHC: 30.9 g/dL (ref 30.0–36.0)
MCV: 82.1 fL (ref 80.0–100.0)
Monocytes Absolute: 0.7 10*3/uL (ref 0.1–1.0)
Monocytes Relative: 5 %
Neutro Abs: 12.8 10*3/uL — ABNORMAL HIGH (ref 1.7–7.7)
Neutrophils Relative %: 93 %
Platelets: 342 10*3/uL (ref 150–400)
RBC: 3.51 MIL/uL — ABNORMAL LOW (ref 3.87–5.11)
RDW: 16.8 % — ABNORMAL HIGH (ref 11.5–15.5)
WBC: 13.8 10*3/uL — ABNORMAL HIGH (ref 4.0–10.5)
nRBC: 0 % (ref 0.0–0.2)

## 2020-12-05 LAB — BASIC METABOLIC PANEL
Anion gap: 11 (ref 5–15)
BUN: 17 mg/dL (ref 8–23)
CO2: 20 mmol/L — ABNORMAL LOW (ref 22–32)
Calcium: 8.7 mg/dL — ABNORMAL LOW (ref 8.9–10.3)
Chloride: 105 mmol/L (ref 98–111)
Creatinine, Ser: 0.76 mg/dL (ref 0.44–1.00)
GFR, Estimated: >60 mL/min (ref >60)
Glucose, Bld: 120 mg/dL — ABNORMAL HIGH (ref 70–99)
Potassium: 5.8 mmol/L — ABNORMAL HIGH (ref 3.5–5.1)
Sodium: 136 mmol/L (ref 135–145)

## 2020-12-05 LAB — SARS CORONAVIRUS 2 BY RT PCR (HOSPITAL ORDER, PERFORMED IN ~~LOC~~ HOSPITAL LAB): SARS Coronavirus 2: NEGATIVE

## 2020-12-05 MED ORDER — IRBESARTAN 150 MG PO TABS
300.0000 mg | ORAL_TABLET | Freq: Every day | ORAL | Status: DC
  Administered 2020-12-05 – 2020-12-08 (×4): 300 mg via ORAL
  Filled 2020-12-05: qty 2
  Filled 2020-12-05: qty 1
  Filled 2020-12-05 (×3): qty 2
  Filled 2020-12-05: qty 1

## 2020-12-05 MED ORDER — AMLODIPINE BESYLATE 5 MG PO TABS
5.0000 mg | ORAL_TABLET | Freq: Every day | ORAL | Status: DC
  Administered 2020-12-05 – 2020-12-08 (×4): 5 mg via ORAL
  Filled 2020-12-05 (×4): qty 1

## 2020-12-05 MED ORDER — MORPHINE SULFATE (PF) 2 MG/ML IV SOLN: 0.5000 mg | INTRAVENOUS | Status: DC | PRN

## 2020-12-05 MED ORDER — POLYETHYLENE GLYCOL 3350 17 G PO PACK: 17.0000 g | PACK | Freq: Every day | ORAL | Status: DC | PRN

## 2020-12-05 MED ORDER — POVIDONE-IODINE 10 % EX SWAB
2.0000 "application " | Freq: Once | CUTANEOUS | Status: AC
  Administered 2020-12-06: 2 via TOPICAL

## 2020-12-05 MED ORDER — BISACODYL 10 MG RE SUPP: 10.0000 mg | Freq: Every day | RECTAL | Status: DC | PRN

## 2020-12-05 MED ORDER — TRANEXAMIC ACID-NACL 1000-0.7 MG/100ML-% IV SOLN
1000.0000 mg | INTRAVENOUS | Status: AC
  Administered 2020-12-06: 1000 mg via INTRAVENOUS
  Filled 2020-12-05: qty 100

## 2020-12-05 MED ORDER — HYDROCODONE-ACETAMINOPHEN 5-325 MG PO TABS
1.0000 | ORAL_TABLET | ORAL | Status: DC | PRN
  Administered 2020-12-05: 1 via ORAL
  Filled 2020-12-05: qty 1

## 2020-12-05 MED ORDER — MORPHINE SULFATE (PF) 2 MG/ML IV SOLN
2.0000 mg | Freq: Once | INTRAVENOUS | Status: AC
  Administered 2020-12-05: 2 mg via INTRAVENOUS
  Filled 2020-12-05: qty 1

## 2020-12-05 MED ORDER — LEVOTHYROXINE SODIUM 75 MCG PO TABS
75.0000 ug | ORAL_TABLET | Freq: Every day | ORAL | Status: DC
  Administered 2020-12-06 – 2020-12-08 (×3): 75 ug via ORAL
  Filled 2020-12-05 (×3): qty 1

## 2020-12-05 MED ORDER — SODIUM CHLORIDE 0.9 % IV SOLN: INTRAVENOUS | Status: AC

## 2020-12-05 MED ORDER — FENTANYL CITRATE (PF) 100 MCG/2ML IJ SOLN
12.5000 ug | Freq: Once | INTRAMUSCULAR | Status: AC
  Administered 2020-12-05: 12.5 ug via INTRAVENOUS
  Filled 2020-12-05: qty 2

## 2020-12-05 MED ORDER — ENOXAPARIN SODIUM 40 MG/0.4ML ~~LOC~~ SOLN
40.0000 mg | SUBCUTANEOUS | Status: DC
  Administered 2020-12-05: 40 mg via SUBCUTANEOUS
  Filled 2020-12-05: qty 0.4

## 2020-12-05 MED ORDER — CHLORHEXIDINE GLUCONATE 4 % EX LIQD
60.0000 mL | Freq: Once | CUTANEOUS | Status: AC
  Administered 2020-12-06: 4 via TOPICAL

## 2020-12-05 MED ORDER — OXYMETAZOLINE HCL 0.05 % NA SOLN
1.0000 | Freq: Every day | NASAL | Status: DC | PRN
  Filled 2020-12-05: qty 15

## 2020-12-05 MED ORDER — VITAMIN D 25 MCG (1000 UNIT) PO TABS
5000.0000 [IU] | ORAL_TABLET | Freq: Every day | ORAL | Status: DC
  Administered 2020-12-06 – 2020-12-08 (×3): 5000 [IU] via ORAL
  Filled 2020-12-05 (×3): qty 5

## 2020-12-05 MED ORDER — METHOCARBAMOL 500 MG PO TABS
500.0000 mg | ORAL_TABLET | Freq: Four times a day (QID) | ORAL | Status: DC | PRN
  Filled 2020-12-05 (×2): qty 1

## 2020-12-05 MED ORDER — METHOCARBAMOL 1000 MG/10ML IJ SOLN
500.0000 mg | Freq: Four times a day (QID) | INTRAVENOUS | Status: DC | PRN
  Filled 2020-12-05: qty 5

## 2020-12-05 MED ORDER — CEFAZOLIN SODIUM-DEXTROSE 2-4 GM/100ML-% IV SOLN
2.0000 g | INTRAVENOUS | Status: AC
  Administered 2020-12-06: 2 g via INTRAVENOUS
  Filled 2020-12-05 (×2): qty 100

## 2020-12-05 MED ORDER — RALOXIFENE HCL 60 MG PO TABS
60.0000 mg | ORAL_TABLET | Freq: Every day | ORAL | Status: DC
  Administered 2020-12-06 – 2020-12-08 (×3): 60 mg via ORAL
  Filled 2020-12-05 (×3): qty 1

## 2020-12-05 MED ORDER — ADULT MULTIVITAMIN W/MINERALS CH
1.0000 | ORAL_TABLET | Freq: Every day | ORAL | Status: DC
  Administered 2020-12-05 – 2020-12-08 (×4): 1 via ORAL
  Filled 2020-12-05 (×4): qty 1

## 2020-12-05 NOTE — ED Triage Notes (Addendum)
[facility] transferred pt from home and reports the following:  Pt was walking with a cane, letting her dog out of the house around 5 am this morning and ended up on the floor. She landed on her bottom. She was using a cane to walk. She called her caregiver who came to her house assisted her to the bathroom, then to the bed. Later in the day a caregiver tried to assist her to bathroom again and it was too painful, so they called EMS. EMS gave 100 mcg of fentanyl and 500 mL fluid. NSR on EKG. [place] vaccinated. Received Pfizer booster around 6 months ago.

## 2020-12-05 NOTE — H&P (Addendum)
[facility] History and Physical  Nives Nawabi KZL:[id number] DOB: [birth year] DOA: 12/05/2020  Referring physician: ED  PCP: Thayer Headings, MD (Inactive)   Patient is coming from: Home  Chief Complaint: Right hip pain  HPI: Cheryl Maldonado is a 87 y.o. female with past medical history of left hip fracture status post arthroplasty presented to the hospital with right hip pain.  Patient stated that she was walking with a cane letting her dog out of the house around 5 am in the morning but ended up falling on the floor. Subsequently, she called her caregiver who assisted her to the bathroom went to her bed.  Pain however persisted so EMS was called in.  EMS gave 100 MG, fentanyl and  IV fluid.  EKG on site showed normal sinus rhythm.  Patient was then brought into the hospital due to right hip pain. Patient denies any chest pain, shortness of breath, dizziness, lightheadedness prior to fall.  Patient denies any nausea, vomiting, diarrhea or abdominal pain.  Patient denies any fever or chills or runny nose.  Denies any sick contacts or recent travel.  Denies any urinary urgency, frequency or dysuria.  ED Course: In the ED, vitals were stable.  X-ray of the right hip showed  right femur fracture and orthopedics was consulted.  Review of Systems:  All systems were reviewed and were negative unless otherwise mentioned in the HPI  Past Medical History:  Diagnosis Date  . Breast cancer (HCC)   . Hypertension   . Intertrochanteric fracture of left hip (HCC) 04/11/2019  . Uterine cancer [facility])    Past Surgical History:  Procedure Laterality Date  . ABDOMINAL HYSTERECTOMY    . COLONOSCOPY    . FEMUR IM NAIL Left 04/13/2019   Procedure: INTRAMEDULLARY (IM) NAIL FEMORAL;  Surgeon: Samson Frederic, MD;  Location: [facility];  Service: Orthopedics;  Laterality: Left;    Social History:  reports that she has never smoked. She has never used smokeless tobacco. She reports that she does not drink  alcohol and does not use drugs.  No Known Allergies  Family History  Problem Relation Age of Onset  . Cancer Mother      Prior to Admission medications   Medication Sig Start Date End Date Taking? Authorizing Provider  amLODipine (NORVASC) 5 MG tablet Take 5 mg by mouth daily.   Yes [provider]  Cholecalciferol (VITAMIN D) 125 MCG (5000 UT) CAPS Take 5,000 Units by mouth daily.   Yes [provider]  levothyroxine (SYNTHROID, LEVOTHROID) 75 MCG tablet Take 75 mcg by mouth daily before breakfast.   Yes [provider]  Multiple Vitamins-Minerals (MULTIVITAMIN ADULT) TABS Take 1 tablet by mouth daily.   Yes [provider]  oxymetazoline (AFRIN) 0.05 % nasal spray Place 1 spray into both nostrils daily as needed for congestion.   Yes [provider]  raloxifene (EVISTA) 60 MG tablet Take 60 mg by mouth daily.   Yes [provider]  simvastatin (ZOCOR) 5 MG tablet Take 5 mg by mouth every other day.   Yes [provider]  telmisartan (MICARDIS) 80 MG tablet Take 80 mg by mouth daily. 10/30/20  Yes [provider]  vitamin C (ASCORBIC ACID) 500 MG tablet Take 500 mg by mouth daily.   Yes [provider]  HYDROcodone-acetaminophen (NORCO/VICODIN) 5-325 MG tablet Take 1 tablet by mouth every 4 (four) hours as needed for moderate pain (pain score 4-6). Patient not taking: No sig reported 04/15/19  [place], [place]    Physical Exam: Vitals:   12/05/20 1500 12/05/20 1530 12/05/20 1600 12/05/20 1630  BP: (!) 145/55 (!) 144/59 (!) 138/53 (!) 127/51  Pulse: 85 84 90 81  Resp: (!) 24 (!) 21 (!) 24 19  Temp:      TempSrc:      SpO2: 94% 96% 100% 94%  Weight:      Height:       Wt Readings from Last 3 Encounters:  12/05/20 45.4 kg  04/13/19 50 kg  02/19/16 46.7 kg   Body mass index is 18.89 kg/m.  General:  thinly built, not in obvious distress HENT: Normocephalic, pupils equally reacting to light  and accommodation.  No scleral pallor or icterus noted. Oral mucosa is moist.  Chest:  Clear breath sounds.  Diminished breath sounds bilaterally. No crackles or wheezes.  CVS: S1 &S2 heard. No murmur.  Regular rate and rhythm. Abdomen: Soft, nontender, nondistended.  Bowel sounds are heard.  Liver is not palpable, no abdominal mass palpated Extremities: Right leg with lymphedema-tenderness over the right hip on palpation.  Distal pulses palpable.  Distal sensation intact.  Peripheral pulses are palpable. Psych: Alert, awake and oriented, normal mood CNS:  No cranial nerve deficits.  Right lower extremity with edema and tenderness  skin: Warm and dry.  No rashes noted.  Labs on Admission:   CBC: Recent Labs  Lab 12/05/20 1320  WBC 13.8*  NEUTROABS 12.8*  HGB 8.9*  HCT 28.8*  MCV 82.1  PLT [id number]    Basic Metabolic Panel: Recent Labs  Lab 12/05/20 1320  NA 136  K 5.8*  CL 105  CO2 20*  GLUCOSE 120*  BUN 17  CREATININE 0.76  CALCIUM 8.7*    Liver Function Tests: No results for input(s): AST, ALT, ALKPHOS, BILITOT, PROT, ALBUMIN in the last 168 hours. No results for input(s): LIPASE, AMYLASE in the last 168 hours. No results for input(s): AMMONIA in the last 168 hours.  Cardiac Enzymes: No results for input(s): CKTOTAL, CKMB, CKMBINDEX, TROPONINI in the last 168 hours.  BNP (last 3 results) No results for input(s): BNP in the last 8760 hours.  ProBNP (last 3 results) No results for input(s): PROBNP in the last 8760 hours.  CBG: No results for input(s): GLUCAP in the last 168 hours.   Drugs of Abuse  No results found for: LABOPIA, COCAINSCRNUR, LABBENZ, AMPHETMU, THCU, LABBARB    Radiological Exams on Admission: DG Hip Unilat With Pelvis 2-3 Views Right  Result Date: 12/05/2020 CLINICAL DATA:  Pain EXAM: DG HIP (WITH OR WITHOUT PELVIS) 2-3V RIGHT COMPARISON:  None. FINDINGS: There is an acute, displaced, transcervical fracture of the proximal right femur. There is  no dislocation. The patient is status post prior intramedullary nail placement through the left femur. There are degenerative changes throughout the lower lumbar spine. IMPRESSION: Acute, displaced, transcervical fracture of the proximal right femur. Electronically Signed   By: Constance Holster M.D.   On: 12/05/2020 16:04   VAS [place] LOWER EXTREMITY VENOUS (DVT) (ONLY [facility] & [facility])  Result Date: 12/05/2020  Lower Venous DVT Study Indications: Swelling.  Risk Factors: None identified. Limitations: Poor ultrasound/tissue interface. Comparison Study: No prior studies. Performing Technologist: Oliver Hum RVT  Examination Guidelines: A complete evaluation includes B-mode imaging, spectral Doppler, color Doppler, and power Doppler as needed of all accessible portions of each vessel. Bilateral testing is considered an integral part of a complete examination. Limited examinations for reoccurring indications may be performed  as noted. The reflux portion of the exam is performed with the patient in reverse Trendelenburg.  +---------+---------------+---------+-----------+----------+-------------------+ RIGHT    CompressibilityPhasicitySpontaneityPropertiesThrombus Aging      +---------+---------------+---------+-----------+----------+-------------------+ CFV      Full           Yes      Yes                                      +---------+---------------+---------+-----------+----------+-------------------+ SFJ      Full                                                             +---------+---------------+---------+-----------+----------+-------------------+ FV Prox  Full                                                             +---------+---------------+---------+-----------+----------+-------------------+ FV Mid   Full                                                             +---------+---------------+---------+-----------+----------+-------------------+ FV DistalFull                                                              +---------+---------------+---------+-----------+----------+-------------------+ PFV      Full                                                             +---------+---------------+---------+-----------+----------+-------------------+ POP      Full           Yes      Yes                                      +---------+---------------+---------+-----------+----------+-------------------+ PTV      Full                                                             +---------+---------------+---------+-----------+----------+-------------------+ PERO  Not well visualized +---------+---------------+---------+-----------+----------+-------------------+   +----+---------------+---------+-----------+----------+--------------+ LEFTCompressibilityPhasicitySpontaneityPropertiesThrombus Aging +----+---------------+---------+-----------+----------+--------------+ CFV Full           Yes      Yes                                 +----+---------------+---------+-----------+----------+--------------+     Summary: RIGHT: - There is no evidence of deep vein thrombosis in the lower extremity. However, portions of this examination were limited- see technologist comments above.  - No cystic structure found in the popliteal fossa.  LEFT: - No evidence of common femoral vein obstruction.  *See table(s) above for measurements and observations. Electronically signed by Jamelle Haring on 12/05/2020 at 3:44:58 PM.    Final     EKG: Personally reviewed by me which shows normal sinus rhythm.  Assessment/Plan Principal Problem:   Closed right hip fracture, initial encounter [facility]) Active Problems:   Essential hypertension   S/P right hip fracture  Closed right hip fracture status post fall.  Mechanical fall.  X-ray showed acute displaced transcervical fracture of the proximal right femur.  Focus on  analgesia, bedrest.  Orthopedics on board.  Will need surgical intervention.  We will keep n.p.o. after midnight.  Spoke with orthopedics at bedside.  Patient is optimized for surgical intervention.  Patient uses cane for ambulation at home.  Lives by herself.  Might need rehabilitation after discharge.  History of left hip fracture status post IM nailing.  Currently on raloxifene and vitamin D.  We will continue with that.  Had been operated by Dr. Lyla Glassing in the past.  Right leg lymphedema.  Chronic.  Supportive care.  Duplex ultrasound of the lower extremity was negative for DVT.  History of hypertension.   Resume amlodipine, Micardis.  Closely monitor blood pressure.  Hyperlipidemia.  On simvastatin at home.  Will hold for now.  DVT Prophylaxis: Lovenox subcu to be started after surgery  Consultant:  orthopedics  Code Status: DNR  Microbiology none  Antibiotics: None  Family Communication:  Patients' condition and plan of care including tests being ordered have been discussed with the patient and the patient's son who indicate understanding and agree with the plan.   Status is: Inpatient  Remains inpatient appropriate because:IV treatments appropriate due to intensity of illness or inability to take PO, Inpatient level of care appropriate due to severity of illness and    Dispo: The patient is from: Home              Anticipated d/c is to: undetermined at this time.              Anticipated d/c date is: 2 days              Patient currently is not medically stable to d/c.   Severity of Illness: The appropriate patient status for this patient is INPATIENT. Inpatient status is judged to be reasonable and necessary in order to provide the required intensity of service to ensure the patient's safety. The patient's presenting symptoms, physical exam findings, and initial radiographic and laboratory data in the context of their chronic comorbidities is felt to place them at high  risk for further clinical deterioration. Furthermore, it is not anticipated that the patient will be medically stable for discharge from the hospital within 2 midnights of admission.  I certify that at the point of admission it is my clinical judgment that the patient will require inpatient hospital  care spanning beyond 2 midnights from the point of admission due to high intensity of service, high risk for further deterioration and high frequency of surveillance required.   Signed, Flora Lipps, MD [facility] 12/05/2020

## 2020-12-05 NOTE — Progress Notes (Signed)
Right lower extremity venous duplex has been completed. Preliminary results can be found in CV Proc through chart review.  Results were given to Dr. Laverta Baltimore.  12/05/20 2:02 PM Carlos Levering RVT

## 2020-12-05 NOTE — Consult Note (Signed)
Patient ID: Cheryl Maldonado MRN: [id number] DOB/AGE: 01-15-1933 87 y.o.  Admit date: 12/05/2020  Admission Diagnoses:  Active Problems:   * No active hospital problems. *   HPI: Patient previously had left IM nail with Dr. Lyla Glassing. Pt was walking with a cane, letting her dog out of the house around 5 am this morning and ended up on the floor. She landed on her bottom. She was using a cane to walk. She called her caregiver who came to her house assisted her to the bathroom, then to the bed. Later in the day a caregiver tried to assist her to bathroom again and it was too painful, so they called EMS. In the ED, patient was found to have a left femur fracture and ortho was asked to see the patient as a consult. Patient resting comfortably with son at bedside.  Patient denies use of ASA of blood thinner.  Patient and son both say that patient has right lower leg lymphadema at baseline.   Past Medical History: Past Medical History:  Diagnosis Date  . Breast cancer ([place])   . Hypertension   . Intertrochanteric fracture of left hip ([place]) 04/11/2019  . Uterine cancer [facility])     Surgical History: Past Surgical History:  Procedure Laterality Date  . ABDOMINAL HYSTERECTOMY    . COLONOSCOPY    . FEMUR IM NAIL Left 04/13/2019   Procedure: INTRAMEDULLARY (IM) NAIL FEMORAL;  Surgeon: Rod Can, MD;  Location: [facility];  Service: Orthopedics;  Laterality: Left;    Family History: Family History  Problem Relation Age of Onset  . Cancer Mother     Social History: Social History   Socioeconomic History  . Marital status: Married    Spouse name: Not on file  . Number of children: 2  . Years of education: Not on file  . Highest education level: Not on file  Occupational History  . Occupation: retired  Tobacco Use  . Smoking status: Never Smoker  . Smokeless tobacco: Never Used  Vaping Use  . Vaping Use: Never used  Substance and Sexual Activity  . Alcohol use: No    Alcohol/week:  0.0 standard drinks  . Drug use: Never  . Sexual activity: Not on file  Other Topics Concern  . Not on file  Social History Narrative  . Not on file   Social Determinants of Health   Financial Resource Strain: Not on file  Food Insecurity: Not on file  Transportation Needs: Not on file  Physical Activity: Not on file  Stress: Not on file  Social Connections: Not on file  Intimate Partner Violence: Not on file    Allergies: Patient has no known allergies.  Medications: I have reviewed the patient's current medications.  Vital Signs: Patient Vitals for the past 24 hrs:  BP Temp Temp src Pulse Resp SpO2 Height Weight  12/05/20 1630 (!) 127/51 -- -- 81 19 94 % -- --  12/05/20 1600 (!) 138/53 -- -- 90 (!) 24 100 % -- --  12/05/20 1530 (!) 144/59 -- -- 84 (!) 21 96 % -- --  12/05/20 1500 (!) 145/55 -- -- 85 (!) 24 94 % -- --  12/05/20 1445 -- -- -- 85 (!) 24 93 % -- --  12/05/20 1430 (!) 140/54 -- -- 85 20 96 % -- --  12/05/20 1425 138/80 -- -- 84 (!) 27 96 % -- --  12/05/20 1415 -- -- -- 81 17 98 % -- --  12/05/20 1400 -- -- --  86 (!) 25 95 % -- --  12/05/20 1345 -- -- -- 95 (!) 28 94 % -- --  12/05/20 1330 (!) 134/55 -- -- 83 (!) 21 95 % -- --  12/05/20 1315 -- -- -- 89 19 95 % -- --  12/05/20 1300 (!) 139/54 -- -- 84 (!) 24 95 % -- --  12/05/20 1245 (!) 152/55 -- -- 86 20 97 % -- --  12/05/20 1241 -- -- -- -- -- -- 5\' 1"  (1.549 m) 45.4 kg  12/05/20 1235 (!) 143/73 97.8 F (36.6 C) Oral 88 20 96 % -- --  12/05/20 1230 (!) 143/73 -- -- 87 -- 94 % -- --    Radiology: DG Hip Unilat With Pelvis 2-3 Views Right  Result Date: 12/05/2020 CLINICAL DATA:  Pain EXAM: DG HIP (WITH OR WITHOUT PELVIS) 2-3V RIGHT COMPARISON:  None. FINDINGS: There is an acute, displaced, transcervical fracture of the proximal right femur. There is no dislocation. The patient is status post prior intramedullary nail placement through the left femur. There are degenerative changes throughout the lower  lumbar spine. IMPRESSION: Acute, displaced, transcervical fracture of the proximal right femur. Electronically Signed   By: Constance Holster M.D.   On: 12/05/2020 16:04   VAS [place] LOWER EXTREMITY VENOUS (DVT) (ONLY [facility])  Result Date: 12/05/2020  Lower Venous DVT Study Indications: Swelling.  Risk Factors: None identified. Limitations: Poor ultrasound/tissue interface. Comparison Study: No prior studies. Performing Technologist: Oliver Hum RVT  Examination Guidelines: A complete evaluation includes B-mode imaging, spectral Doppler, color Doppler, and power Doppler as needed of all accessible portions of each vessel. Bilateral testing is considered an integral part of a complete examination. Limited examinations for reoccurring indications may be performed as noted. The reflux portion of the exam is performed with the patient in reverse Trendelenburg.  +---------+---------------+---------+-----------+----------+-------------------+ RIGHT    CompressibilityPhasicitySpontaneityPropertiesThrombus Aging      +---------+---------------+---------+-----------+----------+-------------------+ CFV      Full           Yes      Yes                                      +---------+---------------+---------+-----------+----------+-------------------+ SFJ      Full                                                             +---------+---------------+---------+-----------+----------+-------------------+ FV Prox  Full                                                             +---------+---------------+---------+-----------+----------+-------------------+ FV Mid   Full                                                             +---------+---------------+---------+-----------+----------+-------------------+ FV DistalFull                                                             +---------+---------------+---------+-----------+----------+-------------------+  PFV      Full                                                              +---------+---------------+---------+-----------+----------+-------------------+ POP      Full           Yes      Yes                                      +---------+---------------+---------+-----------+----------+-------------------+ PTV      Full                                                             +---------+---------------+---------+-----------+----------+-------------------+ PERO                                                  Not well visualized +---------+---------------+---------+-----------+----------+-------------------+   +----+---------------+---------+-----------+----------+--------------+ LEFTCompressibilityPhasicitySpontaneityPropertiesThrombus Aging +----+---------------+---------+-----------+----------+--------------+ CFV Full           Yes      Yes                                 +----+---------------+---------+-----------+----------+--------------+     Summary: RIGHT: - There is no evidence of deep vein thrombosis in the lower extremity. However, portions of this examination were limited- see technologist comments above.  - No cystic structure found in the popliteal fossa.  LEFT: - No evidence of common femoral vein obstruction.  *See table(s) above for measurements and observations. Electronically signed by Jamelle Haring on 12/05/2020 at 3:44:58 PM.    Final     Labs: Recent Labs    12/05/20 1320  WBC 13.8*  RBC 3.51*  HCT 28.8*  PLT 342   Recent Labs    12/05/20 1320  NA 136  K 5.8*  CL 105  CO2 20*  BUN 17  CREATININE 0.76  GLUCOSE 120*  CALCIUM 8.7*   No results for input(s): LABPT, INR in the last 72 hours.  Review of Systems: Review of Systems  Constitutional: Negative.   Cardiovascular: Positive for leg swelling.  Skin: Negative.     Physical Exam: Body mass index is 18.89 kg/m.  Physical Exam    AAOx3, NAD. Resting with right hip in externally rotated  position. Pain over right hip Obvious right LE swelling. Plantarflexion and dorsiflexion in tact.  Distal sensation in tact bilaterally.  Dorsalis pedal pulse intact.  Cap refill <2 sec bilaterally.  Assessment and Plan:  Acute, displaced, transcervical fracture of the proximal right femur.  Case discussed with Dr. Lyla Glassing, definitive management per Dr. Lyla Glassing, likely surgical intervention 2pm tomorrow  NPO after midnight   Will plan to admit to medicine team tonight, manage pain. Surgical intervention tomorrow.     [facility] Deshia Vanderhoof PA-C [facility]

## 2020-12-05 NOTE — ED Provider Notes (Signed)
[place] DEPT Provider Note   CSN: [id number] Arrival date & time: 12/05/20  1212     History Chief Complaint  Patient presents with  . Fall    Cheryl Maldonado is a 87 y.o. female.  HPI   Patient with significant medical history of breast cancer, hypertension, left hip fracture presents with chief complaint of right hip pain.  Patient endorses that she had a mechanical fall around 5 AM this morning, she states she was trying to let out her dog lost her balance and fell onto the floor.  She states she landed on her bottom and was unable to get up under her own accord.  She waited on the ground for approximately 30 minutes until home health came and got her up.  They were able to get in her bed.  But since then she has been unable to apply any weight to her leg, she states she has pain in her right hip when she tries to put a weight to or move it,  denies paresthesias or weakness in that leg.  She generally walks around with a cane without difficulty.  She denies hitting her head, losing conscious, is not on anticoagulants.  She denies  alleviating factors.  Patient denies fevers, chills, shortness of breath, chest pain, abdominal pain, nausea, vomiting, diarrhea, worsening pedal edema.  Past Medical History:  Diagnosis Date  . Breast cancer ([place])   . Hypertension   . Intertrochanteric fracture of left hip ([place]) 04/11/2019  . Uterine cancer [facility])     Patient Active Problem List   Diagnosis Date Noted  . Closed right hip fracture, initial encounter ([place]) 12/05/2020  . S/P right hip fracture 12/05/2020  . Closed comminuted intertrochanteric fracture of left femur ([place]) 04/13/2019  . Intertrochanteric fracture ([place]) 04/11/2019  . Essential hypertension 04/11/2019  . Anemia 04/11/2019  . Intertrochanteric fracture of left hip ([place]) 04/11/2019    Past Surgical History:  Procedure Laterality Date  . ABDOMINAL HYSTERECTOMY    . COLONOSCOPY    . FEMUR IM  NAIL Left 04/13/2019   Procedure: INTRAMEDULLARY (IM) NAIL FEMORAL;  Surgeon: Rod Can, MD;  Location: [facility];  Service: Orthopedics;  Laterality: Left;     OB History   No obstetric history on file.     Family History  Problem Relation Age of Onset  . Cancer Mother     Social History   Tobacco Use  . Smoking status: Never Smoker  . Smokeless tobacco: Never Used  Vaping Use  . Vaping Use: Never used  Substance Use Topics  . Alcohol use: No    Alcohol/week: 0.0 standard drinks  . Drug use: Never    Home Medications Prior to Admission medications   Medication Sig Start Date End Date Taking? Authorizing Provider  amLODipine (NORVASC) 5 MG tablet Take 5 mg by mouth daily.   Yes [provider]  Cholecalciferol (VITAMIN D) 125 MCG (5000 UT) CAPS Take 5,000 Units by mouth daily.   Yes [provider]  levothyroxine (SYNTHROID, LEVOTHROID) 75 MCG tablet Take 75 mcg by mouth daily before breakfast.   Yes [provider]  Multiple Vitamins-Minerals (MULTIVITAMIN ADULT) TABS Take 1 tablet by mouth daily.   Yes [provider]  oxymetazoline (AFRIN) 0.05 % nasal spray Place 1 spray into both nostrils daily as needed for congestion.   Yes [provider]  raloxifene (EVISTA) 60 MG tablet Take 60 mg by mouth daily.   Yes [provider]  simvastatin (ZOCOR) 5 MG tablet Take 5 mg by mouth every other day.   Yes [provider]  telmisartan (MICARDIS) 80 MG tablet Take 80 mg by mouth daily. 10/30/20  Yes [provider]  vitamin C (ASCORBIC ACID) 500 MG tablet Take 500 mg by mouth daily.   Yes [provider]  HYDROcodone-acetaminophen (NORCO/VICODIN) 5-325 MG tablet Take 1 tablet by mouth every 4 (four) hours as needed for moderate pain (pain score 4-6). Patient not taking: No sig reported 04/15/19   [place] Eric J, DO    Allergies    Patient has no known allergies.  Review of Systems   Review of  Systems  Constitutional: Negative for chills and fever.  HENT: Negative for congestion and sore throat.   Respiratory: Negative for shortness of breath.   Cardiovascular: Negative for chest pain.  Gastrointestinal: Negative for abdominal pain.  Genitourinary: Negative for enuresis.  Musculoskeletal: Negative for back pain.       Right hip pain.  Skin: Negative for rash.  Neurological: Negative for dizziness and headaches.  Hematological: Does not bruise/bleed easily.    Physical Exam Updated Vital Signs BP 111/82   Pulse 85   Temp 97.8 F (36.6 C) (Oral)   Resp (!) 23   Ht 5\' 1"  (1.549 m)   Wt 45.4 kg   SpO2 95%   BMI 18.89 kg/m   Physical Exam Vitals and nursing note reviewed.  Constitutional:      General: She is not in acute distress.    Appearance: She is not ill-appearing.  HENT:     Head: Normocephalic and atraumatic.     Nose: No congestion.  Eyes:     Conjunctiva/sclera: Conjunctivae normal.  Cardiovascular:     Rate and Rhythm: Normal rate and regular rhythm.     Pulses: Normal pulses.     Heart sounds: No murmur heard. No friction rub. No gallop.   Pulmonary:     Effort: No respiratory distress.     Breath sounds: No wheezing, rhonchi or rales.  Abdominal:     Palpations: Abdomen is soft.     Tenderness: There is no abdominal tenderness.  Musculoskeletal:     Right lower leg: Edema present.     Comments: Patient's right leg was visualized, it was externally rotated, shortened, 2+ edema up to the shins.  Neurovascular fully intact.  She was tender to palpation along her right trochanter, crepitus present, no other deformities noted.  She had full range of motion at her toes, ankle, knee, unable to flex or send the hip.  Skin:    General: Skin is warm and dry.  Neurological:     Mental Status: She is alert.  Psychiatric:        Mood and Affect: Mood normal.     ED Results / Procedures / Treatments   Labs (all labs ordered are listed, but only  abnormal results are displayed) Labs Reviewed  BASIC METABOLIC PANEL - Abnormal; Notable for the following components:      Result Value   Potassium 5.8 (*)    CO2 20 (*)    Glucose, Bld 120 (*)    Calcium 8.7 (*)    All other components within normal limits  CBC WITH DIFFERENTIAL/PLATELET - Abnormal; Notable for the following components:   WBC 13.8 (*)    RBC 3.51 (*)    Hemoglobin 8.9 (*)    HCT 28.8 (*)    MCH 25.4 (*)    RDW  16.8 (*)    Neutro Abs 12.8 (*)    Lymphs Abs 0.3 (*)    All other components within normal limits  SARS CORONAVIRUS 2 BY RT PCR (HOSPITAL ORDER, [place] [facility])  CBC  COMPREHENSIVE METABOLIC PANEL  MAGNESIUM  PHOSPHORUS  PROTIME-INR  TYPE AND SCREEN    EKG EKG Interpretation  Date/Time:  Thursday December 05 2020 13:57:26 EST Ventricular Rate:  87 PR Interval:    QRS Duration: 125 QT Interval:  398 QTC Calculation: 479 R Axis:   62 Text Interpretation: Sinus rhythm Ventricular premature complex Nonspecific intraventricular conduction delay Minimal ST depression, inferior leads No STEMI Confirmed by Nanda Quinton [phone]) on 12/05/2020 2:24:10 PM   Radiology DG Hip Unilat With Pelvis 2-3 Views Right  Result Date: 12/05/2020 CLINICAL DATA:  Pain EXAM: DG HIP (WITH OR WITHOUT PELVIS) 2-3V RIGHT COMPARISON:  None. FINDINGS: There is an acute, displaced, transcervical fracture of the proximal right femur. There is no dislocation. The patient is status post prior intramedullary nail placement through the left femur. There are degenerative changes throughout the lower lumbar spine. IMPRESSION: Acute, displaced, transcervical fracture of the proximal right femur. Electronically Signed   By: Constance Holster M.D.   On: 12/05/2020 16:04   VAS [place] LOWER EXTREMITY VENOUS (DVT) (ONLY [facility] & [facility])  Result Date: 12/05/2020  Lower Venous DVT Study Indications: Swelling.  Risk Factors: None identified. Limitations: Poor ultrasound/tissue  interface. Comparison Study: No prior studies. Performing Technologist: Oliver Hum RVT  Examination Guidelines: A complete evaluation includes B-mode imaging, spectral Doppler, color Doppler, and power Doppler as needed of all accessible portions of each vessel. Bilateral testing is considered an integral part of a complete examination. Limited examinations for reoccurring indications may be performed as noted. The reflux portion of the exam is performed with the patient in reverse Trendelenburg.  +---------+---------------+---------+-----------+----------+-------------------+ RIGHT    CompressibilityPhasicitySpontaneityPropertiesThrombus Aging      +---------+---------------+---------+-----------+----------+-------------------+ CFV      Full           Yes      Yes                                      +---------+---------------+---------+-----------+----------+-------------------+ SFJ      Full                                                             +---------+---------------+---------+-----------+----------+-------------------+ FV Prox  Full                                                             +---------+---------------+---------+-----------+----------+-------------------+ FV Mid   Full                                                             +---------+---------------+---------+-----------+----------+-------------------+ FV DistalFull                                                             +---------+---------------+---------+-----------+----------+-------------------+  PFV      Full                                                             +---------+---------------+---------+-----------+----------+-------------------+ POP      Full           Yes      Yes                                      +---------+---------------+---------+-----------+----------+-------------------+ PTV      Full                                                              +---------+---------------+---------+-----------+----------+-------------------+ PERO                                                  Not well visualized +---------+---------------+---------+-----------+----------+-------------------+   +----+---------------+---------+-----------+----------+--------------+ LEFTCompressibilityPhasicitySpontaneityPropertiesThrombus Aging +----+---------------+---------+-----------+----------+--------------+ CFV Full           Yes      Yes                                 +----+---------------+---------+-----------+----------+--------------+     Summary: RIGHT: - There is no evidence of deep vein thrombosis in the lower extremity. However, portions of this examination were limited- see technologist comments above.  - No cystic structure found in the popliteal fossa.  LEFT: - No evidence of common femoral vein obstruction.  *See table(s) above for measurements and observations. Electronically signed by Jamelle Haring on 12/05/2020 at 3:44:58 PM.    Final     Procedures Procedures   Medications Ordered in ED Medications  HYDROcodone-acetaminophen (NORCO/VICODIN) 5-325 MG per tablet 1 tablet (has no administration in time range)  amLODipine (NORVASC) tablet 5 mg (has no administration in time range)  irbesartan (AVAPRO) tablet 300 mg (has no administration in time range)  levothyroxine (SYNTHROID) tablet 75 mcg (has no administration in time range)  raloxifene (EVISTA) tablet 60 mg (has no administration in time range)  Vitamin D CAPS 5,000 Units (has no administration in time range)  Multivitamin Adult TABS 1 tablet (has no administration in time range)  oxymetazoline (AFRIN) 0.05 % nasal spray 1 spray (has no administration in time range)  morphine 2 MG/ML injection 0.5 mg (has no administration in time range)  enoxaparin (LOVENOX) injection 40 mg (has no administration in time range)  methocarbamol (ROBAXIN) tablet 500 mg (has no  administration in time range)    Or  methocarbamol (ROBAXIN) 500 mg in dextrose 5 % 50 mL IVPB (has no administration in time range)  polyethylene glycol (MIRALAX / GLYCOLAX) packet 17 g (has no administration in time range)  bisacodyl (DULCOLAX) suppository 10 mg (has no administration in time range)  fentaNYL (SUBLIMAZE) injection 12.5 mcg (12.5 mcg Intravenous Given 12/05/20 1410)  morphine 2  MG/ML injection 2 mg (2 mg Intravenous Given 12/05/20 1609)    ED Course  I have reviewed the triage vital signs and the nursing notes.  Pertinent labs & imaging results that were available during my care of the patient were reviewed by me and considered in my medical decision making (see chart for details).    MDM Rules/Calculators/A&P                          Initial impression-patient presents after a mechanical fall.  She is alert, does not appear in acute distress, vital signs reassuring.  Concern for hip fracture, pelvic fracture, DVT.  Will obtain basic lab work, imaging and DVT rule out.  Work-up-CBC shows slight leukocytosis of 13.8, normocytic anemia appears to be at baseline for patient.  BMP shows hyperkalemia 5.8, increased from baseline, decrease in CO2 of 20, no anion gap present.  DVT study negative.  EKG sinus rhythm not signs of ischemia no ST elevation depression noted.  X-ray shows acute displaced transcervical fracture of the proximal right femur.  Consult due to transcervical fracture of the right femur will consult with orthopedic surgery for further evaluation.  Appears that her last hip was performed by Dr. Lyla Glassing will consult with [facility].  Spoke with Dr. Rolena Infante of orthopedic surgery, he recommends admission medicine, no need to make her n.p.o. at this time.  He will contact Dr. Lyla Glassing who will determine when patient will go to the OR. Spoke with Dr. Louanne Belton of the hospitalist team, he has accepted the patient and will come down and evaluate her.  Reassessment patient is  reassessed after providing her with morphine, states she is feeling much better, has no complaints at this time.  Vitals remained stable.   Rule out-low suspicion for systemic infection as patient is nontoxic-appearing, vital signs reassuring, no obvious source infection on my exam.  Patient is noted to have acute leukocytosis but I suspect this is acute phase reactant as she has no infectious source.  Low suspicion for CVA or intracranial head bleed as patient denies headaches, change in vision, paresthesia or weakness in the upper or lower extremities, no neuro deficits on my exam.  Low suspicion for DVT as DVT study was negative.  Patient is noted to have hyperkalemia but I suspect this is a false positive as the sample is hemolyzed, there is no EKG changes noted.    Plan- suspect patient's transcervical fracture seen on x-ray secondary due to mechanical fall.  Anticipate patient will need hospital admission, and possible surgical fixation.  Patient care will be transferred over to admitting team.     Final Clinical Impression(s) / ED Diagnoses Final diagnoses:  Fall, initial encounter  Closed transcervical fracture of right femur, initial encounter [facility])    Rx / DC Orders ED Discharge Orders    None       Marcello Fennel, PA-C 12/05/20 1743    Margette Fast, MD 12/06/20 [phone]

## 2020-12-05 NOTE — ED Notes (Signed)
Patient transported to X-ray 

## 2020-12-06 ENCOUNTER — Inpatient Hospital Stay (HOSPITAL_COMMUNITY): Payer: Medicare PPO | Admitting: Certified Registered Nurse Anesthetist

## 2020-12-06 ENCOUNTER — Encounter (HOSPITAL_COMMUNITY): Payer: Self-pay | Admitting: Internal Medicine

## 2020-12-06 ENCOUNTER — Encounter (HOSPITAL_COMMUNITY)
Admission: EM | Disposition: A | Discharge status: Home Health | Payer: Self-pay | Source: Home / Self Care | Attending: Internal Medicine

## 2020-12-06 ENCOUNTER — Inpatient Hospital Stay (HOSPITAL_COMMUNITY): Payer: Medicare PPO

## 2020-12-06 ENCOUNTER — Inpatient Hospital Stay: Admit: 2020-12-06 | Payer: Medicare PPO | Admitting: Orthopedic Surgery

## 2020-12-06 DIAGNOSIS — Z8781 Personal history of (healed) traumatic fracture: Secondary | ICD-10-CM

## 2020-12-06 DIAGNOSIS — I10 Essential (primary) hypertension: Secondary | ICD-10-CM | POA: Diagnosis not present

## 2020-12-06 DIAGNOSIS — S72001A Fracture of unspecified part of neck of right femur, initial encounter for closed fracture: Secondary | ICD-10-CM | POA: Diagnosis not present

## 2020-12-06 HISTORY — PX: TOTAL HIP ARTHROPLASTY: SHX124

## 2020-12-06 LAB — COMPREHENSIVE METABOLIC PANEL
ALT: 20 U/L (ref 0–44)
AST: 24 U/L (ref 15–41)
Albumin: 3.6 g/dL (ref 3.5–5.0)
Alkaline Phosphatase: 36 U/L — ABNORMAL LOW (ref 38–126)
Anion gap: 12 (ref 5–15)
BUN: 16 mg/dL (ref 8–23)
CO2: 22 mmol/L (ref 22–32)
Calcium: 9.3 mg/dL (ref 8.9–10.3)
Chloride: 102 mmol/L (ref 98–111)
Creatinine, Ser: 0.66 mg/dL (ref 0.44–1.00)
GFR, Estimated: >60 mL/min (ref >60)
Glucose, Bld: 112 mg/dL — ABNORMAL HIGH (ref 70–99)
Potassium: 3.6 mmol/L (ref 3.5–5.1)
Sodium: 136 mmol/L (ref 135–145)
Total Bilirubin: 0.8 mg/dL (ref 0.3–1.2)
Total Protein: 5.6 g/dL — ABNORMAL LOW (ref 6.5–8.1)

## 2020-12-06 LAB — CBC
HCT: 26.9 % — ABNORMAL LOW (ref 36.0–46.0)
Hemoglobin: 8.3 g/dL — ABNORMAL LOW (ref 12.0–15.0)
MCH: 25 pg — ABNORMAL LOW (ref 26.0–34.0)
MCHC: 30.9 g/dL (ref 30.0–36.0)
MCV: 81 fL (ref 80.0–100.0)
Platelets: 341 10*3/uL (ref 150–400)
RBC: 3.32 MIL/uL — ABNORMAL LOW (ref 3.87–5.11)
RDW: 16.7 % — ABNORMAL HIGH (ref 11.5–15.5)
WBC: 11 10*3/uL — ABNORMAL HIGH (ref 4.0–10.5)
nRBC: 0 % (ref 0.0–0.2)

## 2020-12-06 LAB — SURGICAL PCR SCREEN
MRSA, PCR: NEGATIVE
Staphylococcus aureus: NEGATIVE

## 2020-12-06 LAB — PROTIME-INR
INR: 1.1 (ref 0.8–1.2)
Prothrombin Time: 13.4 seconds (ref 11.4–15.2)

## 2020-12-06 LAB — PHOSPHORUS: Phosphorus: 2.9 mg/dL (ref 2.5–4.6)

## 2020-12-06 LAB — MAGNESIUM: Magnesium: 2.2 mg/dL (ref 1.7–2.4)

## 2020-12-06 SURGERY — ARTHROPLASTY, HIP, TOTAL, ANTERIOR APPROACH
Anesthesia: General | Site: Hip | Laterality: Right

## 2020-12-06 MED ORDER — PHENYLEPHRINE HCL-NACL 10-0.9 MG/250ML-% IV SOLN
INTRAVENOUS | Status: DC | PRN
  Administered 2020-12-06: 75 ug/min via INTRAVENOUS

## 2020-12-06 MED ORDER — WATER FOR IRRIGATION, STERILE IR SOLN
Status: DC | PRN
  Administered 2020-12-06: 2000 mL

## 2020-12-06 MED ORDER — PROPOFOL 10 MG/ML IV BOLUS
INTRAVENOUS | Status: AC
  Filled 2020-12-06: qty 20

## 2020-12-06 MED ORDER — HYDROMORPHONE HCL 2 MG/ML IJ SOLN
INTRAMUSCULAR | Status: AC
  Filled 2020-12-06: qty 1

## 2020-12-06 MED ORDER — ACETAMINOPHEN 325 MG PO TABS
325.0000 mg | ORAL_TABLET | Freq: Four times a day (QID) | ORAL | Status: DC | PRN
Start: 2020-12-07 — End: 2020-12-08

## 2020-12-06 MED ORDER — FENTANYL CITRATE (PF) 250 MCG/5ML IJ SOLN
INTRAMUSCULAR | Status: DC | PRN
  Administered 2020-12-06 (×2): 50 ug via INTRAVENOUS
  Administered 2020-12-06: 100 ug via INTRAVENOUS

## 2020-12-06 MED ORDER — BUPIVACAINE-EPINEPHRINE 0.25% -1:200000 IJ SOLN
INTRAMUSCULAR | Status: DC | PRN
  Administered 2020-12-06: 30 mL

## 2020-12-06 MED ORDER — METOCLOPRAMIDE HCL 5 MG/ML IJ SOLN: 5.0000 mg | Freq: Three times a day (TID) | INTRAMUSCULAR | Status: DC | PRN

## 2020-12-06 MED ORDER — EPHEDRINE SULFATE-NACL 50-0.9 MG/10ML-% IV SOSY
PREFILLED_SYRINGE | INTRAVENOUS | Status: DC | PRN
  Administered 2020-12-06 (×3): 10 mg via INTRAVENOUS

## 2020-12-06 MED ORDER — HYDROCODONE-ACETAMINOPHEN 5-325 MG PO TABS
1.0000 | ORAL_TABLET | ORAL | Status: DC | PRN
  Administered 2020-12-08: 2 via ORAL
  Filled 2020-12-06: qty 2

## 2020-12-06 MED ORDER — KETOROLAC TROMETHAMINE 30 MG/ML IJ SOLN
INTRAMUSCULAR | Status: AC
  Filled 2020-12-06: qty 1

## 2020-12-06 MED ORDER — MENTHOL 3 MG MT LOZG: 1.0000 | LOZENGE | OROMUCOSAL | Status: DC | PRN

## 2020-12-06 MED ORDER — LIDOCAINE 2% (20 MG/ML) 5 ML SYRINGE
INTRAMUSCULAR | Status: DC | PRN
  Administered 2020-12-06: 100 mg via INTRAVENOUS

## 2020-12-06 MED ORDER — HYDROMORPHONE HCL 1 MG/ML IJ SOLN
INTRAMUSCULAR | Status: DC | PRN
  Administered 2020-12-06 (×2): .5 mg via INTRAVENOUS

## 2020-12-06 MED ORDER — DEXAMETHASONE SODIUM PHOSPHATE 10 MG/ML IJ SOLN
INTRAMUSCULAR | Status: DC | PRN
  Administered 2020-12-06: 5 mg via INTRAVENOUS

## 2020-12-06 MED ORDER — SODIUM CHLORIDE (PF) 0.9 % IJ SOLN
INTRAMUSCULAR | Status: DC | PRN
  Administered 2020-12-06: 30 mL

## 2020-12-06 MED ORDER — METOCLOPRAMIDE HCL 5 MG PO TABS: 5.0000 mg | ORAL_TABLET | Freq: Three times a day (TID) | ORAL | Status: DC | PRN

## 2020-12-06 MED ORDER — METHOCARBAMOL 500 MG PO TABS
500.0000 mg | ORAL_TABLET | Freq: Four times a day (QID) | ORAL | Status: DC | PRN
  Administered 2020-12-07: 500 mg via ORAL

## 2020-12-06 MED ORDER — FENTANYL CITRATE (PF) 100 MCG/2ML IJ SOLN
INTRAMUSCULAR | Status: AC
  Filled 2020-12-06: qty 2

## 2020-12-06 MED ORDER — PHENYLEPHRINE 40 MCG/ML (10ML) SYRINGE FOR IV PUSH (FOR BLOOD PRESSURE SUPPORT)
PREFILLED_SYRINGE | INTRAVENOUS | Status: AC
  Filled 2020-12-06: qty 10

## 2020-12-06 MED ORDER — KETOROLAC TROMETHAMINE 30 MG/ML IJ SOLN
INTRAMUSCULAR | Status: DC | PRN
  Administered 2020-12-06: 30 mg via INTRAMUSCULAR

## 2020-12-06 MED ORDER — ISOPROPYL ALCOHOL 70 % SOLN
Status: DC | PRN
  Administered 2020-12-06: 1 via TOPICAL

## 2020-12-06 MED ORDER — LACTATED RINGERS IV SOLN: INTRAVENOUS | Status: DC

## 2020-12-06 MED ORDER — SODIUM CHLORIDE 0.9 % IR SOLN
Status: DC | PRN
  Administered 2020-12-06: 4000 mL

## 2020-12-06 MED ORDER — PROPOFOL 10 MG/ML IV BOLUS
INTRAVENOUS | Status: DC | PRN
  Administered 2020-12-06: 50 mg via INTRAVENOUS
  Administered 2020-12-06: 120 mg via INTRAVENOUS

## 2020-12-06 MED ORDER — CHLORHEXIDINE GLUCONATE 0.12 % MT SOLN
15.0000 mL | Freq: Once | OROMUCOSAL | Status: AC
  Administered 2020-12-06: 15 mL via OROMUCOSAL

## 2020-12-06 MED ORDER — 0.9 % SODIUM CHLORIDE (POUR BTL) OPTIME
TOPICAL | Status: DC | PRN
  Administered 2020-12-06: 1000 mL

## 2020-12-06 MED ORDER — PHENOL 1.4 % MT LIQD: 1.0000 | OROMUCOSAL | Status: DC | PRN

## 2020-12-06 MED ORDER — HYDROCODONE-ACETAMINOPHEN 7.5-325 MG PO TABS
1.0000 | ORAL_TABLET | ORAL | Status: DC | PRN
Start: 2020-12-06 — End: 2020-12-08

## 2020-12-06 MED ORDER — FENTANYL CITRATE (PF) 100 MCG/2ML IJ SOLN
25.0000 ug | INTRAMUSCULAR | Status: DC | PRN
Start: 2020-12-06 — End: 2020-12-06

## 2020-12-06 MED ORDER — MORPHINE SULFATE (PF) 2 MG/ML IV SOLN: 0.5000 mg | INTRAVENOUS | Status: DC | PRN

## 2020-12-06 MED ORDER — CEFAZOLIN SODIUM-DEXTROSE 2-4 GM/100ML-% IV SOLN
2.0000 g | Freq: Four times a day (QID) | INTRAVENOUS | Status: AC
  Administered 2020-12-06 – 2020-12-07 (×2): 2 g via INTRAVENOUS
  Filled 2020-12-06 (×3): qty 100

## 2020-12-06 MED ORDER — ACETAMINOPHEN 10 MG/ML IV SOLN: 1000.0000 mg | Freq: Once | INTRAVENOUS | Status: DC | PRN

## 2020-12-06 MED ORDER — ONDANSETRON HCL 4 MG/2ML IJ SOLN: 4.0000 mg | Freq: Once | INTRAMUSCULAR | Status: DC | PRN

## 2020-12-06 MED ORDER — ISOPROPYL ALCOHOL 70 % SOLN
Status: AC
  Filled 2020-12-06: qty 480

## 2020-12-06 MED ORDER — ENOXAPARIN SODIUM 30 MG/0.3ML ~~LOC~~ SOLN
30.0000 mg | SUBCUTANEOUS | Status: DC
  Administered 2020-12-07 – 2020-12-08 (×2): 30 mg via SUBCUTANEOUS
  Filled 2020-12-06 (×2): qty 0.3

## 2020-12-06 MED ORDER — METHOCARBAMOL 500 MG IVPB - SIMPLE MED
500.0000 mg | Freq: Four times a day (QID) | INTRAVENOUS | Status: DC | PRN
  Filled 2020-12-06: qty 50

## 2020-12-06 MED ORDER — DOCUSATE SODIUM 100 MG PO CAPS
100.0000 mg | ORAL_CAPSULE | Freq: Two times a day (BID) | ORAL | Status: DC
  Administered 2020-12-06 – 2020-12-08 (×4): 100 mg via ORAL
  Filled 2020-12-06 (×4): qty 1

## 2020-12-06 MED ORDER — ONDANSETRON HCL 4 MG PO TABS: 4.0000 mg | ORAL_TABLET | Freq: Four times a day (QID) | ORAL | Status: DC | PRN

## 2020-12-06 MED ORDER — MUPIROCIN 2 % EX OINT
1.0000 "application " | TOPICAL_OINTMENT | Freq: Two times a day (BID) | CUTANEOUS | Status: DC
  Administered 2020-12-06 – 2020-12-08 (×5): 1 via NASAL
  Filled 2020-12-06: qty 22

## 2020-12-06 MED ORDER — ONDANSETRON HCL 4 MG/2ML IJ SOLN
INTRAMUSCULAR | Status: DC | PRN
  Administered 2020-12-06: 4 mg via INTRAVENOUS

## 2020-12-06 MED ORDER — BUPIVACAINE-EPINEPHRINE (PF) 0.25% -1:200000 IJ SOLN
INTRAMUSCULAR | Status: AC
  Filled 2020-12-06: qty 30

## 2020-12-06 MED ORDER — LACTATED RINGERS IV SOLN: INTRAVENOUS | Status: DC | PRN

## 2020-12-06 MED ORDER — ONDANSETRON HCL 4 MG/2ML IJ SOLN: 4.0000 mg | Freq: Four times a day (QID) | INTRAMUSCULAR | Status: DC | PRN

## 2020-12-06 MED ORDER — ROCURONIUM BROMIDE 10 MG/ML (PF) SYRINGE
PREFILLED_SYRINGE | INTRAVENOUS | Status: AC
  Filled 2020-12-06: qty 10

## 2020-12-06 SURGICAL SUPPLY — 66 items
ADH SKN CLS APL DERMABOND .7 (GAUZE/BANDAGES/DRESSINGS) ×2
APL PRP STRL LF DISP 70% ISPRP (MISCELLANEOUS) ×1
BAG DECANTER FOR FLEXI CONT (MISCELLANEOUS) IMPLANT
BAG SPEC THK2 15X12 ZIP CLS (MISCELLANEOUS)
BAG ZIPLOCK 12X15 (MISCELLANEOUS) IMPLANT
BLADE SURG SZ10 CARB STEEL (BLADE) IMPLANT
CHLORAPREP W/TINT 26 (MISCELLANEOUS) ×2 IMPLANT
COVER PERINEAL POST (MISCELLANEOUS) ×2 IMPLANT
COVER SURGICAL LIGHT HANDLE (MISCELLANEOUS) ×2 IMPLANT
COVER WAND RF STERILE (DRAPES) IMPLANT
CUP SECTOR GRIPTON 50MM (Cup) ×2 IMPLANT
DECANTER SPIKE VIAL GLASS SM (MISCELLANEOUS) ×2 IMPLANT
DERMABOND ADVANCED (GAUZE/BANDAGES/DRESSINGS) ×2
DERMABOND ADVANCED .7 DNX12 (GAUZE/BANDAGES/DRESSINGS) ×2 IMPLANT
DRAPE IMP U-DRAPE 54X76 (DRAPES) ×2 IMPLANT
DRAPE SHEET LG 3/4 BI-LAMINATE (DRAPES) ×6 IMPLANT
DRAPE STERI IOBAN 125X83 (DRAPES) IMPLANT
DRAPE U-SHAPE 47X51 STRL (DRAPES) ×4 IMPLANT
DRSG AQUACEL AG ADV 3.5X10 (GAUZE/BANDAGES/DRESSINGS) ×2 IMPLANT
ELECT REM PT RETURN 15FT ADLT (MISCELLANEOUS) ×2 IMPLANT
GAUZE SPONGE 4X4 12PLY STRL (GAUZE/BANDAGES/DRESSINGS) ×2 IMPLANT
GLOVE BIO SURGEON STRL SZ8.5 (GLOVE) ×4 IMPLANT
GLOVE BIOGEL PI IND STRL 8.5 (GLOVE) ×1 IMPLANT
GLOVE BIOGEL PI INDICATOR 8.5 (GLOVE) ×1
GLOVE SRG 8 PF TXTR STRL LF DI (GLOVE) ×1 IMPLANT
GLOVE SURG ENC TEXT LTX SZ7.5 (GLOVE) ×4 IMPLANT
GLOVE SURG UNDER POLY LF SZ8 (GLOVE) ×2
GOWN SPEC L3 XXLG W/TWL (GOWN DISPOSABLE) ×2 IMPLANT
GOWN STRL REUS W/ TWL LRG LVL3 (GOWN DISPOSABLE) ×1 IMPLANT
GOWN STRL REUS W/TWL LRG LVL3 (GOWN DISPOSABLE) ×2
HANDPIECE INTERPULSE COAX TIP (DISPOSABLE) ×2
HEAD FEM STD 32X+1 STRL (Hips) ×2 IMPLANT
HOLDER FOLEY CATH W/STRAP (MISCELLANEOUS) ×2 IMPLANT
HOOD PEEL AWAY FLYTE STAYCOOL (MISCELLANEOUS) ×8 IMPLANT
JET LAVAGE IRRISEPT WOUND (IRRIGATION / IRRIGATOR) ×2
KIT TURNOVER KIT A (KITS) IMPLANT
LAVAGE JET IRRISEPT WOUND (IRRIGATION / IRRIGATOR) ×1 IMPLANT
LINER ACET PNNCL PLUS4 NEUTRAL (Hips) ×1 IMPLANT
MANIFOLD NEPTUNE II (INSTRUMENTS) ×2 IMPLANT
MARKER SKIN DUAL TIP RULER LAB (MISCELLANEOUS) ×2 IMPLANT
NDL SAFETY ECLIPSE 18X1.5 (NEEDLE) ×1 IMPLANT
NEEDLE HYPO 18GX1.5 SHARP (NEEDLE) ×2
NEEDLE SPNL 18GX3.5 QUINCKE PK (NEEDLE) ×2 IMPLANT
PACK ANTERIOR HIP CUSTOM (KITS) ×2 IMPLANT
PENCIL SMOKE EVACUATOR (MISCELLANEOUS) IMPLANT
PINNACLE PLUS 4 NEUTRAL (Hips) ×2 IMPLANT
SAW OSC TIP CART 19.5X105X1.3 (SAW) ×2 IMPLANT
SCREW 6.5MMX35MM (Screw) ×2 IMPLANT
SCREW PINN CAN BONE 6.5MMX15MM (Screw) ×2 IMPLANT
SEALER BIPOLAR AQUA 6.0 (INSTRUMENTS) ×2 IMPLANT
SET HNDPC FAN SPRY TIP SCT (DISPOSABLE) ×1 IMPLANT
STEM TRI LOC BPS SZ7 W GRIPTON (Hips) ×1 IMPLANT
SUT ETHIBOND NAB CT1 #1 30IN (SUTURE) ×4 IMPLANT
SUT MNCRL AB 3-0 PS2 18 (SUTURE) ×2 IMPLANT
SUT MNCRL AB 4-0 PS2 18 (SUTURE) ×2 IMPLANT
SUT MON AB 2-0 CT1 36 (SUTURE) ×4 IMPLANT
SUT STRATAFIX PDO 1 14 VIOLET (SUTURE) ×2
SUT STRATFX PDO 1 14 VIOLET (SUTURE) ×1
SUT VIC AB 2-0 CT1 27 (SUTURE) ×2
SUT VIC AB 2-0 CT1 TAPERPNT 27 (SUTURE) ×1 IMPLANT
SUTURE STRATFX PDO 1 14 VIOLET (SUTURE) ×1 IMPLANT
SYR 3ML LL SCALE MARK (SYRINGE) ×2 IMPLANT
TRAY FOLEY MTR SLVR 16FR STAT (SET/KITS/TRAYS/PACK) IMPLANT
TRI LOC BPS SZ 7 W GRIPTON (Hips) ×2 IMPLANT
TUBE SUCTION HIGH CAP CLEAR NV (SUCTIONS) ×4 IMPLANT
WATER STERILE IRR 1000ML POUR (IV SOLUTION) ×2 IMPLANT

## 2020-12-06 NOTE — Plan of Care (Signed)
  Problem: Education: Goal: Knowledge of General Education information will improve Description Including pain rating scale, medication(s)/side effects and non-pharmacologic comfort measures Outcome: Progressing   

## 2020-12-06 NOTE — Anesthesia Procedure Notes (Signed)
Procedure Name: LMA Insertion Performed by: Rosaland Lao, CRNA Pre-anesthesia Checklist: Patient identified, Emergency Drugs available, Suction available and Patient being monitored Patient Re-evaluated:Patient Re-evaluated prior to induction Oxygen Delivery Method: Circle system utilized Preoxygenation: Pre-oxygenation with 100% oxygen Induction Type: IV induction LMA: LMA inserted LMA Size: 3.0 Tube type: Oral Number of attempts: 1 Tube secured with: Tape Dental Injury: Teeth and Oropharynx as per pre-operative assessment

## 2020-12-06 NOTE — Progress Notes (Signed)
Pt back in room from pacu alert without complaints, denies pain. drsg to rt hip dry and intact. Vss. pts son is at bedside.

## 2020-12-06 NOTE — Discharge Instructions (Signed)
°Dr. Romesha Scherer °Joint Replacement Specialist °[facility] °[address] °[place],  [address] °[phone] ° ° °TOTAL HIP REPLACEMENT POSTOPERATIVE DIRECTIONS ° ° ° °Hip Rehabilitation, Guidelines Following Surgery  ° °WEIGHT BEARING °Weight bearing as tolerated with assist device (walker, cane, etc) as directed, use it as long as suggested by your surgeon or therapist, typically at least 4-6 weeks. ° °The results of a hip operation are greatly improved after range of motion and muscle strengthening exercises. Follow all safety measures which are given to protect your hip. If any of these exercises cause increased pain or swelling in your joint, decrease the amount until you are comfortable again. Then slowly increase the exercises. Call your caregiver if you have problems or questions.  ° °HOME CARE INSTRUCTIONS  °Most of the following instructions are designed to prevent the dislocation of your new hip.  °Remove items at home which could result in a fall. This includes throw rugs or furniture in walking pathways.  °Continue medications as instructed at time of discharge. °· You may have some home medications which will be placed on hold until you complete the course of blood thinner medication. °· You may start showering once you are discharged home. Do not remove your dressing. °Do not put on socks or shoes without following the instructions of your caregivers.   °Sit on chairs with arms. Use the chair arms to help push yourself up when arising.  °Arrange for the use of a toilet seat elevator so you are not sitting low.  °· Walk with walker as instructed.  °You may resume a sexual relationship in one month or when given the OK by your caregiver.  °Use walker as long as suggested by your caregivers.  °You may put full weight on your legs and walk as much as is comfortable. °Avoid periods of inactivity such as sitting longer than an hour when not asleep. This helps prevent  blood clots.  °You may return to work once you are cleared by your surgeon.  °Do not drive a car for 6 weeks or until released by your surgeon.  °Do not drive while taking narcotics.  °Wear elastic stockings for two weeks following surgery during the day but you may remove then at night.  °Make sure you keep all of your appointments after your operation with all of your doctors and caregivers. You should call the office at the above phone number and make an appointment for approximately two weeks after the date of your surgery. °Please pick up a stool softener and laxative for home use as long as you are requiring pain medications. °· ICE to the affected hip every three hours for 30 minutes at a time and then as needed for pain and swelling. Continue to use ice on the hip for pain and swelling from surgery. You may notice swelling that will progress down to the foot and ankle.  This is normal after surgery.  Elevate the leg when you are not up walking on it.   °It is important for you to complete the blood thinner medication as prescribed by your doctor. °· Continue to use the breathing machine which will help keep your temperature down.  It is common for your temperature to cycle up and down following surgery, especially at night when you are not up moving around and exerting yourself.  The breathing machine keeps your lungs expanded and your temperature down. ° °RANGE OF MOTION AND STRENGTHENING EXERCISES  °These exercises are   designed to help you keep full movement of your hip joint. Follow your caregiver's or physical therapist's instructions. Perform all exercises about fifteen times, three times per day or as directed. Exercise both hips, even if you have had only one joint replacement. These exercises can be done on a training (exercise) mat, on the floor, on a table or on a bed. Use whatever works the best and is most comfortable for you. Use music or television while you are exercising so that the exercises  are a pleasant break in your day. This will make your life better with the exercises acting as a break in routine you can look forward to.  °Lying on your back, slowly slide your foot toward your buttocks, raising your knee up off the floor. Then slowly slide your foot back down until your leg is straight again.  °Lying on your back spread your legs as far apart as you can without causing discomfort.  °Lying on your side, raise your upper leg and foot straight up from the floor as far as is comfortable. Slowly lower the leg and repeat.  °Lying on your back, tighten up the muscle in the front of your thigh (quadriceps muscles). You can do this by keeping your leg straight and trying to raise your heel off the floor. This helps strengthen the largest muscle supporting your knee.  °Lying on your back, tighten up the muscles of your buttocks both with the legs straight and with the knee bent at a comfortable angle while keeping your heel on the floor.  ° °SKILLED REHAB INSTRUCTIONS: °If the patient is transferred to a skilled rehab facility following release from the hospital, a list of the current medications will be sent to the facility for the patient to continue.  When discharged from the skilled rehab facility, please have the facility set up the patient's Home Health Physical Therapy prior to being released. Also, the skilled facility will be responsible for providing the patient with their medications at time of release from the facility to include their pain medication and their blood thinner medication. If the patient is still at the rehab facility at time of the two week follow up appointment, the skilled rehab facility will also need to assist the patient in arranging follow up appointment in our office and any transportation needs. ° °MAKE SURE YOU:  °Understand these instructions.  °Will watch your condition.  °Will get help right away if you are not doing well or get worse. ° °Pick up stool softner and  laxative for home use following surgery while on pain medications. °Do not remove your dressing. °The dressing is waterproof--it is OK to take showers. °Continue to use ice for pain and swelling after surgery. °Do not use any lotions or creams on the incision until instructed by your surgeon. °Total Hip Protocol. ° ° °

## 2020-12-06 NOTE — Transfer of Care (Signed)
Immediate Anesthesia Transfer of Care Note  Patient: [name]  Procedure(s) Performed: TOTAL HIP ARTHROPLASTY ANTERIOR APPROACH (Right Hip)  Patient Location: PACU  Anesthesia Type:General  Level of Consciousness: drowsy and patient cooperative  Airway & Oxygen Therapy: Patient Spontanous Breathing and Patient connected to face mask oxygen  Post-op Assessment: Report given to RN and Post -op Vital signs reviewed and stable  Post vital signs: Reviewed and stable  Last Vitals:  Vitals Value Taken Time  BP 112/44 12/06/20 1730  Temp    Pulse 77 12/06/20 1733  Resp 12 12/06/20 1733  SpO2 100 % 12/06/20 1733  Vitals shown include unvalidated device data.  Last Pain:  Vitals:   12/06/20 1406  TempSrc: Oral  PainSc: 0-No pain         Complications: No complications documented.

## 2020-12-06 NOTE — Consult Note (Signed)
ORTHOPAEDIC CONSULTATION  REQUESTING PHYSICIAN: Joycelyn Das, MD  PCP:  Thayer Headings, MD (Inactive)  Chief Complaint: Right hip injury  HPI: Cheryl Maldonado is a 87 y.o. female who complains of right hip pain and inability to weight-bear.  She fell while she was walking the dog.  She does have chronic lymphedema to the right lower extremity.  She denies other injuries.  She was brought to the emergency department at [facility], where x-rays revealed a displaced right femoral neck fracture.  She was admitted to the hospitalist service for perioperative or stratification medical optimization.  Orthopedic consultation was placed for management of her right hip fracture.  Past Medical History:  Diagnosis Date  . Breast cancer (HCC)   . Hypertension   . Intertrochanteric fracture of left hip (HCC) 04/11/2019  . Uterine cancer [facility])    Past Surgical History:  Procedure Laterality Date  . ABDOMINAL HYSTERECTOMY    . COLONOSCOPY    . FEMUR IM NAIL Left 04/13/2019   Procedure: INTRAMEDULLARY (IM) NAIL FEMORAL;  Surgeon: Samson Frederic, MD;  Location: [facility] ORS;  Service: Orthopedics;  Laterality: Left;   Social History   Socioeconomic History  . Marital status: Married    Spouse name: Not on file  . Number of children: 2  . Years of education: Not on file  . Highest education level: Not on file  Occupational History  . Occupation: retired  Tobacco Use  . Smoking status: Never Smoker  . Smokeless tobacco: Never Used  Vaping Use  . Vaping Use: Never used  Substance and Sexual Activity  . Alcohol use: No    Alcohol/week: 0.0 standard drinks  . Drug use: Never  . Sexual activity: Not on file  Other Topics Concern  . Not on file  Social History Narrative  . Not on file   Social Determinants of Health   Financial Resource Strain: Not on file  Food Insecurity: Not on file  Transportation Needs: Not on file  Physical Activity: Not on file  Stress: Not on file   Social Connections: Not on file   Family History  Problem Relation Age of Onset  . Cancer Mother    No Known Allergies Prior to Admission medications   Medication Sig Start Date End Date Taking? Authorizing Provider  amLODipine (NORVASC) 5 MG tablet Take 5 mg by mouth daily.   Yes [provider]  Cholecalciferol (VITAMIN D) 125 MCG (5000 UT) CAPS Take 5,000 Units by mouth daily.   Yes [provider]  levothyroxine (SYNTHROID, LEVOTHROID) 75 MCG tablet Take 75 mcg by mouth daily before breakfast.   Yes [provider]  Multiple Vitamins-Minerals (MULTIVITAMIN ADULT) TABS Take 1 tablet by mouth daily.   Yes [provider]  oxymetazoline (AFRIN) 0.05 % nasal spray Place 1 spray into both nostrils daily as needed for congestion.   Yes [provider]  raloxifene (EVISTA) 60 MG tablet Take 60 mg by mouth daily.   Yes [provider]  simvastatin (ZOCOR) 5 MG tablet Take 5 mg by mouth every other day.   Yes [provider]  telmisartan (MICARDIS) 80 MG tablet Take 80 mg by mouth daily. 10/30/20  Yes [provider]  vitamin C (ASCORBIC ACID) 500 MG tablet Take 500 mg by mouth daily.   Yes [provider]  HYDROcodone-acetaminophen (NORCO/VICODIN) 5-325 MG tablet Take 1 tablet by mouth every 4 (four) hours as needed for moderate pain (pain score 4-6). Patient not taking: No  sig reported 04/15/19   [place] Eric J, DO   DG Knee Right Port  Result Date: 12/05/2020 CLINICAL DATA:  Fall EXAM: PORTABLE RIGHT KNEE - 1-2 VIEW COMPARISON:  None. FINDINGS: No evidence of fracture, dislocation, or joint effusion. No evidence of arthropathy or other focal bone abnormality. Soft tissues are unremarkable. IMPRESSION: Negative. Electronically Signed   By: Franchot Gallo M.D.   On: 12/05/2020 17:58   DG Hip Unilat With Pelvis 2-3 Views Right  Result Date: 12/05/2020 CLINICAL DATA:  Pain EXAM: DG HIP (WITH OR WITHOUT PELVIS) 2-3V  RIGHT COMPARISON:  None. FINDINGS: There is an acute, displaced, transcervical fracture of the proximal right femur. There is no dislocation. The patient is status post prior intramedullary nail placement through the left femur. There are degenerative changes throughout the lower lumbar spine. IMPRESSION: Acute, displaced, transcervical fracture of the proximal right femur. Electronically Signed   By: Constance Holster M.D.   On: 12/05/2020 16:04   VAS [place] LOWER EXTREMITY VENOUS (DVT) (ONLY [facility] & [facility])  Result Date: 12/05/2020  Lower Venous DVT Study Indications: Swelling.  Risk Factors: None identified. Limitations: Poor ultrasound/tissue interface. Comparison Study: No prior studies. Performing Technologist: Oliver Hum RVT  Examination Guidelines: A complete evaluation includes B-mode imaging, spectral Doppler, color Doppler, and power Doppler as needed of all accessible portions of each vessel. Bilateral testing is considered an integral part of a complete examination. Limited examinations for reoccurring indications may be performed as noted. The reflux portion of the exam is performed with the patient in reverse Trendelenburg.  +---------+---------------+---------+-----------+----------+-------------------+ RIGHT    CompressibilityPhasicitySpontaneityPropertiesThrombus Aging      +---------+---------------+---------+-----------+----------+-------------------+ CFV      Full           Yes      Yes                                      +---------+---------------+---------+-----------+----------+-------------------+ SFJ      Full                                                             +---------+---------------+---------+-----------+----------+-------------------+ FV Prox  Full                                                             +---------+---------------+---------+-----------+----------+-------------------+ FV Mid   Full                                                              +---------+---------------+---------+-----------+----------+-------------------+ FV DistalFull                                                             +---------+---------------+---------+-----------+----------+-------------------+  PFV      Full                                                             +---------+---------------+---------+-----------+----------+-------------------+ POP      Full           Yes      Yes                                      +---------+---------------+---------+-----------+----------+-------------------+ PTV      Full                                                             +---------+---------------+---------+-----------+----------+-------------------+ PERO                                                  Not well visualized +---------+---------------+---------+-----------+----------+-------------------+   +----+---------------+---------+-----------+----------+--------------+ LEFTCompressibilityPhasicitySpontaneityPropertiesThrombus Aging +----+---------------+---------+-----------+----------+--------------+ CFV Full           Yes      Yes                                 +----+---------------+---------+-----------+----------+--------------+     Summary: RIGHT: - There is no evidence of deep vein thrombosis in the lower extremity. However, portions of this examination were limited- see technologist comments above.  - No cystic structure found in the popliteal fossa.  LEFT: - No evidence of common femoral vein obstruction.  *See table(s) above for measurements and observations. Electronically signed by Jamelle Haring on 12/05/2020 at 3:44:58 PM.    Final     Positive ROS: All other systems have been reviewed and were otherwise negative with the exception of those mentioned in the HPI and as above.  Physical Exam: General: Alert, no acute distress Cardiovascular: No pedal edema Respiratory: No cyanosis, no use  of accessory musculature GI: No organomegaly, abdomen is soft and non-tender Skin: No lesions in the area of chief complaint Neurologic: Sensation intact distally Psychiatric: Patient is competent for consent with normal mood and affect Lymphatic: No axillary or cervical lymphadenopathy  MUSCULOSKELETAL: Examination of right hip reveals no skin wounds or lesions.  She does have lymphedema to the right lower extremity, which she states is baseline.  Palpable pulses are present.  No focal motor or sensory deficit.  Assessment: Displaced right femoral neck fracture. Right lower extremity lymphedema, unchanged.  Plan: I discussed the findings with the patient.  She has an unstable right hip fracture that requires surgical treatment.  We discussed the risk, benefits, and alternatives to right total hip arthroplasty.  Please see statement of risk.  Plan for surgery today.  All questions solicited and answered.  The risks, benefits, and alternatives were discussed with the patient. There are risks associated with the surgery including, but not limited to, problems with anesthesia (death), infection, instability (  giving out of the joint), dislocation, differences in leg length/angulation/rotation, fracture of bones, loosening or failure of implants, hematoma (blood accumulation) which may require surgical drainage, blood clots, pulmonary embolism, nerve injury (foot drop and lateral thigh numbness), and blood vessel injury. The patient understands these risks and elects to proceed.     Jonette Pesa, MD [phone]    12/06/2020 2:33 PM

## 2020-12-06 NOTE — Op Note (Signed)
OPERATIVE REPORT  SURGEON: Rod Can, MD   ASSISTANT: Cherlynn June, PA-C  PREOPERATIVE DIAGNOSIS: Displaced Right femoral neck fracture.   POSTOPERATIVE DIAGNOSIS: Displaced Right femoral neck fracture.   PROCEDURE: Right total hip arthroplasty, anterior approach.   IMPLANTS: DePuy Tri Lock stem, size 7, hi offset. DePuy Pinnacle Cup, size 50 mm. DePuy Altrx liner, size 32 by 50 mm, +4 neutral. DePuy metal head ball, size 32 + 1 mm. 6.5 mm cancellous bone screw x2.  ANESTHESIA:  General  ANTIBIOTICS: 2g ancef.  ESTIMATED BLOOD LOSS:-250 mL    DRAINS: None.  COMPLICATIONS: None   CONDITION: PACU - hemodynamically stable.   BRIEF CLINICAL NOTE: Cheryl Maldonado is a 87 y.o. female with a displaced Right femoral neck fracture. The patient was admitted to the hospitalist service and underwent perioperative risk stratification and medical optimization. The risks, benefits, and alternatives to total hip arthroplasty were explained, and the patient elected to proceed.  PROCEDURE IN DETAIL: The patient was taken to the operating room and general anesthesia was induced on the hospital bed.  The patient was then positioned on the Hana table.  All bony prominences were well padded.  The hip was prepped and draped in the normal sterile surgical fashion.  A time-out was called verifying side and site of surgery. Antibiotics were given within 60 minutes of beginning the procedure.  The direct anterior approach to the hip was performed through the Hueter interval.  Lateral femoral circumflex vessels were treated with the Auqumantys. The anterior capsule was exposed and an inverted T capsulotomy was made.  Fracture hematoma was encountered and evacuated. The patient was found to have a comminuted Right subcapital femoral neck fracture.  I freshened the femoral neck cut with a saw.  I removed the femoral neck fragment.  A corkscrew was placed into the head and the head was removed.  This was  passed to the back table and was measured.   Acetabular exposure was achieved, and the pulvinar and labrum were excised. Sequential reaming of the acetabulum was then performed up to a size 51 mm reamer. A 52 mm cup was then opened and impacted into place at approximately 40 degrees of abduction and 20 degrees of anteversion. I elected to augment the already acceptable press fit fixation with 6.5 mm cancellous bone screws x2. The final polyethylene liner was impacted into place.    I then gained femoral exposure taking care to protect the abductors and greater trochanter.  This was performed using standard external rotation, extension, and adduction.  The capsule was peeled off the inner aspect of the greater trochanter, taking care to preserve the short external rotators. A cookie cutter was used to enter the femoral canal, and then the femoral canal finder was used to confirm location.  I then sequentially broached up to a size 7.  Calcar planer was used on the femoral neck remnant.  I paced a hi neck and a trial head ball. The hip was reduced.  Leg lengths were checked fluoroscopically.  The hip was dislocated and trial components were removed.  I placed the real stem followed by the real spacer and head ball.  The hip was reduced.  Fluoroscopy was used to confirm component position and leg lengths.  At 90 degrees of external rotation and extension, the hip was stable to an anterior directed force.   The wound was copiously irrigated with Irrisept solution and normal saline using pule lavage.  Marcaine solution was injected into the periarticular soft  tissue.  The wound was closed in layers using #1 Vicryl and V-Loc for the fascia, 2-0 Vicryl for the subcutaneous fat, 2-0 Monocryl for the deep dermal layer, and staples and glue for the skin.  Once the glue was fully dried, an Aquacell Ag dressing was applied.  The patient was then awakened from anesthesia and transported to the recovery room in stable  condition.  Sponge, needle, and instrument counts were correct at the end of the case x2.  The patient tolerated the procedure well and there were no known complications.  Please note that a surgical assistant was a medical necessity for this procedure to perform it in a safe and expeditious manner. Assistant was necessary to provide appropriate retraction of vital neurovascular structures, to prevent femoral fracture, and to allow for anatomic placement of the prosthesis.

## 2020-12-06 NOTE — Progress Notes (Signed)
PROGRESS NOTE  Kindy Misuraca [id number] DOB: September 24, 1933 DOA: 12/05/2020 PCP: Thressa Sheller, MD (Inactive)   LOS: 1 day   Brief narrative:  Cheryl Maldonado is a 87 y.o. female with past medical history of left hip fracture status post arthroplasty presented to the hospital with right hip pain while sustaining a fall walking her dog.  Normally uses a cane for ambulation. In the ED, vitals were stable.  X-ray of the right hip showed  right femur fracture and orthopedics was consulted.  Patient was then admitted to hospital for hip surgery.  Assessment/Plan:  Principal Problem:   Closed right hip fracture, initial encounter [facility]) Active Problems:   Essential hypertension   S/P right hip fracture  Closed right hip fracture status post fall.  Mechanical fall.  No prefall symptoms. X-ray showed acute displaced transcervical fracture of the proximal right femur.  Patient is optimized for surgical intervention.  Patient uses cane for ambulation at home.  Lives by herself.  Might need rehabilitation after discharge.  Awaiting for surgical intervention today.  History of left hip fracture status post IM nailing.  Currently on raloxifene and vitamin D.   Had been operated by Dr. Lyla Glassing in the past.  Right leg lymphedema.  Chronic.  Supportive care.  Duplex ultrasound of the lower extremity was negative for DVT.  History of hypertension.   On amlodipine, Micardis.  Closely monitor blood pressure.  Hyperlipidemia.  On simvastatin at home.  Will hold for now.  DVT prophylaxis: enoxaparin (LOVENOX) injection 40 mg Start: 12/05/20 2200    Code Status: DNR  Family Communication: Spoke with the patient's son yesterday  Status is: Inpatient  Remains inpatient appropriate because:IV treatments appropriate due to intensity of illness or inability to take PO, Inpatient level of care appropriate due to severity of illness and Hip surgery   Dispo: The patient is from: Home               Anticipated d/c is to: Rehabilitation/home with home health              Anticipated d/c date is: 2 days              Patient currently is not medically stable to d/c.   Difficult to place patient No       Consultants:  Orthopedics  Procedures:  None yet  Anti-infectives:  Marland Kitchen Preop cefazolin  Anti-infectives (From admission, onward)   Start     Dose/Rate Route Frequency Ordered Stop   12/06/20 1330  ceFAZolin (ANCEF) IVPB 2g/100 mL premix        2 g 200 mL/hr over 30 Minutes Intravenous On call to O.R. 12/05/20 1940 12/07/20 0559       Subjective: Today, patient was seen and examined at bedside.  Patient denies shortness of breath, chest pain, dizziness, lightheadedness.  Has pain on moving the hip.  Objective: Vitals:   12/06/20 0539 12/06/20 0931  BP: (!) 134/56 (!) 144/55  Pulse: 80 83  Resp: 16 16  Temp: 98.3 F (36.8 C) 98.8 F (37.1 C)  SpO2: 95% 95%    Intake/Output Summary (Last 24 hours) at 12/06/2020 1056 Last data filed at 12/06/2020 0900 Gross per 24 hour  Intake 348.22 ml  Output 1000 ml  Net -651.78 ml   Filed Weights   12/05/20 1241  Weight: 45.4 kg   Body mass index is 18.89 kg/m.   Physical Exam: GENERAL: Patient is alert awake and oriented. Not in obvious distress.  Thinly  built, HENT: No scleral pallor or icterus. Pupils equally reactive to light. Oral mucosa is moist NECK: is supple, no gross swelling noted. CHEST: Clear to auscultation. No crackles or wheezes.  Diminished breath sounds bilaterally. CVS: S1 and S2 heard, no murmur. Regular rate and rhythm.  ABDOMEN: Soft, non-tender, bowel sounds are present. EXTREMITIES: Right lower extremity with chronic lymphedema. CNS: Cranial nerves are intact. No focal motor deficits except for right hip limited motion. SKIN: warm and dry without rashes.  Data Review: I have personally reviewed the following laboratory data and studies,  CBC: Recent Labs  Lab 12/05/20 1320 12/06/20 0312   WBC 13.8* 11.0*  NEUTROABS 12.8*  --   HGB 8.9* 8.3*  HCT 28.8* 26.9*  MCV 82.1 81.0  PLT 342 818   Basic Metabolic Panel: Recent Labs  Lab 12/05/20 1320 12/06/20 0312  NA 136 136  K 5.8* 3.6  CL 105 102  CO2 20* 22  GLUCOSE 120* 112*  BUN 17 16  CREATININE 0.76 0.66  CALCIUM 8.7* 9.3  MG  --  2.2  PHOS  --  2.9   Liver Function Tests: Recent Labs  Lab 12/06/20 0312  AST 24  ALT 20  ALKPHOS 36*  BILITOT 0.8  PROT 5.6*  ALBUMIN 3.6   No results for input(s): LIPASE, AMYLASE in the last 168 hours. No results for input(s): AMMONIA in the last 168 hours. Cardiac Enzymes: No results for input(s): CKTOTAL, CKMB, CKMBINDEX, TROPONINI in the last 168 hours. BNP (last 3 results) No results for input(s): BNP in the last 8760 hours.  ProBNP (last 3 results) No results for input(s): PROBNP in the last 8760 hours.  CBG: No results for input(s): GLUCAP in the last 168 hours. Recent Results (from the past 240 hour(s))  SARS Coronavirus 2 by RT PCR (hospital order, performed in [facility] hospital lab) Nasopharyngeal Nasopharyngeal Swab     Status: None   Collection Time: 12/05/20  2:14 PM   Specimen: Nasopharyngeal Swab  Result Value Ref Range Status   SARS Coronavirus 2 NEGATIVE NEGATIVE Final    Comment: (NOTE) SARS-CoV-2 target nucleic acids are NOT DETECTED.  The SARS-CoV-2 RNA is generally detectable in upper and lower respiratory specimens during the acute phase of infection. The lowest concentration of SARS-CoV-2 viral copies this assay can detect is 250 copies / mL. A negative result does not preclude SARS-CoV-2 infection and should not be used as the sole basis for treatment or other patient management decisions.  A negative result may occur with improper specimen collection / handling, submission of specimen other than nasopharyngeal swab, presence of viral mutation(s) within the areas targeted by this assay, and inadequate number of viral copies (<250  copies / mL). A negative result must be combined with clinical observations, patient history, and epidemiological information.  Fact Sheet for Patients:   StrictlyIdeas.no  Fact Sheet for Healthcare Providers: BankingDealers.co.za  This test is not yet approved or  cleared by the [place] FDA and has been authorized for detection and/or diagnosis of SARS-CoV-2 by FDA under an Emergency Use Authorization (EUA).  This EUA will remain in effect (meaning this test can be used) for the duration of the COVID-19 declaration under Section 564(b)(1) of the Act, 21 U.S.C. section 360bbb-3(b)(1), unless the authorization is terminated or revoked sooner.  Performed at [facility], [place] [address] [place], [place] [address]   Surgical PCR screen     Status: None   Collection Time: 12/06/20  6:03  AM   Specimen: Nasal Mucosa; Nasal Swab  Result Value Ref Range Status   MRSA, PCR NEGATIVE NEGATIVE Final   Staphylococcus aureus NEGATIVE NEGATIVE Final    Comment: (NOTE) The Xpert SA Assay (FDA approved for NASAL specimens in patients 104 years of age and older), is one component of a comprehensive surveillance program. It is not intended to diagnose infection nor to guide or monitor treatment. Performed at [facility], [place] [address] [place], [place] [address]      Studies: DG Knee Right Port  Result Date: 12/05/2020 CLINICAL DATA:  Fall EXAM: PORTABLE RIGHT KNEE - 1-2 VIEW COMPARISON:  None. FINDINGS: No evidence of fracture, dislocation, or joint effusion. No evidence of arthropathy or other focal bone abnormality. Soft tissues are unremarkable. IMPRESSION: Negative. Electronically Signed   By: Franchot Gallo M.D.   On: 12/05/2020 17:58   DG Hip Unilat With Pelvis 2-3 Views Right  Result Date: 12/05/2020 CLINICAL DATA:  Pain EXAM: DG HIP (WITH OR WITHOUT PELVIS) 2-3V RIGHT COMPARISON:  None. FINDINGS:  There is an acute, displaced, transcervical fracture of the proximal right femur. There is no dislocation. The patient is status post prior intramedullary nail placement through the left femur. There are degenerative changes throughout the lower lumbar spine. IMPRESSION: Acute, displaced, transcervical fracture of the proximal right femur. Electronically Signed   By: Constance Holster M.D.   On: 12/05/2020 16:04   VAS [place] LOWER EXTREMITY VENOUS (DVT) (ONLY [facility] & [facility])  Result Date: 12/05/2020  Lower Venous DVT Study Indications: Swelling.  Risk Factors: None identified. Limitations: Poor ultrasound/tissue interface. Comparison Study: No prior studies. Performing Technologist: Oliver Hum RVT  Examination Guidelines: A complete evaluation includes B-mode imaging, spectral Doppler, color Doppler, and power Doppler as needed of all accessible portions of each vessel. Bilateral testing is considered an integral part of a complete examination. Limited examinations for reoccurring indications may be performed as noted. The reflux portion of the exam is performed with the patient in reverse Trendelenburg.  +---------+---------------+---------+-----------+----------+-------------------+ RIGHT    CompressibilityPhasicitySpontaneityPropertiesThrombus Aging      +---------+---------------+---------+-----------+----------+-------------------+ CFV      Full           Yes      Yes                                      +---------+---------------+---------+-----------+----------+-------------------+ SFJ      Full                                                             +---------+---------------+---------+-----------+----------+-------------------+ FV Prox  Full                                                             +---------+---------------+---------+-----------+----------+-------------------+ FV Mid   Full                                                              +---------+---------------+---------+-----------+----------+-------------------+  FV DistalFull                                                             +---------+---------------+---------+-----------+----------+-------------------+ PFV      Full                                                             +---------+---------------+---------+-----------+----------+-------------------+ POP      Full           Yes      Yes                                      +---------+---------------+---------+-----------+----------+-------------------+ PTV      Full                                                             +---------+---------------+---------+-----------+----------+-------------------+ PERO                                                  Not well visualized +---------+---------------+---------+-----------+----------+-------------------+   +----+---------------+---------+-----------+----------+--------------+ LEFTCompressibilityPhasicitySpontaneityPropertiesThrombus Aging +----+---------------+---------+-----------+----------+--------------+ CFV Full           Yes      Yes                                 +----+---------------+---------+-----------+----------+--------------+     Summary: RIGHT: - There is no evidence of deep vein thrombosis in the lower extremity. However, portions of this examination were limited- see technologist comments above.  - No cystic structure found in the popliteal fossa.  LEFT: - No evidence of common femoral vein obstruction.  *See table(s) above for measurements and observations. Electronically signed by Jamelle Haring on 12/05/2020 at 3:44:58 PM.    Final       Flora Lipps, MD  [facility] 12/06/2020  If 7PM-7AM, please contact night-coverage

## 2020-12-06 NOTE — Anesthesia Preprocedure Evaluation (Signed)
Anesthesia Evaluation  Patient identified by MRN, date of birth, ID band Patient awake    Reviewed: Allergy & Precautions, NPO status , Patient's Chart, lab work & pertinent test results  History of Anesthesia Complications Negative for: history of anesthetic complications  Airway Mallampati: I       Dental  (+) Dental Advisory Given   Pulmonary neg pulmonary ROS,    Pulmonary exam normal        Cardiovascular hypertension, Pt. on medications (-) anginaNormal cardiovascular exam     Neuro/Psych negative neurological ROS  negative psych ROS   GI/Hepatic negative GI ROS, Neg liver ROS,   Endo/Other  Hypothyroidism   Renal/GU negative Renal ROS  negative genitourinary   Musculoskeletal  (+) Arthritis ,   Abdominal Normal abdominal exam  (+)   Peds  Hematology  (+) Blood dyscrasia, anemia ,   Anesthesia Other Findings H/o breast cancer H/o uterine cancer  Reproductive/Obstetrics negative OB ROS                             Anesthesia Physical  Anesthesia Plan  ASA: III  Anesthesia Plan: General   Post-op Pain Management:    Induction: Intravenous  PONV Risk Score and Plan: 4 or greater and Ondansetron, Dexamethasone and Treatment may vary due to age or medical condition  Airway Management Planned: Oral ETT  Additional Equipment: None  Intra-op Plan:   Post-operative Plan: Extubation in OR  Informed Consent: I have reviewed the patients History and Physical, chart, labs and discussed the procedure including the risks, benefits and alternatives for the proposed anesthesia with the patient or authorized representative who has indicated his/her understanding and acceptance.     Dental advisory given  Plan Discussed with: CRNA  Anesthesia Plan Comments:         Anesthesia Quick Evaluation

## 2020-12-07 DIAGNOSIS — S72001A Fracture of unspecified part of neck of right femur, initial encounter for closed fracture: Secondary | ICD-10-CM | POA: Diagnosis not present

## 2020-12-07 LAB — CBC
HCT: 23.4 % — ABNORMAL LOW (ref 36.0–46.0)
HCT: 19.6 % — ABNORMAL LOW (ref 36.0–46.0)
Hemoglobin: 7.1 g/dL — ABNORMAL LOW (ref 12.0–15.0)
Hemoglobin: 6.1 g/dL — CL (ref 12.0–15.0)
MCH: 25.2 pg — ABNORMAL LOW (ref 26.0–34.0)
MCH: 25.4 pg — ABNORMAL LOW (ref 26.0–34.0)
MCHC: 30.3 g/dL (ref 30.0–36.0)
MCHC: 31.1 g/dL (ref 30.0–36.0)
MCV: 83 fL (ref 80.0–100.0)
MCV: 81.7 fL (ref 80.0–100.0)
Platelets: 273 10*3/uL (ref 150–400)
Platelets: 254 10*3/uL (ref 150–400)
RBC: 2.82 MIL/uL — ABNORMAL LOW (ref 3.87–5.11)
RBC: 2.4 MIL/uL — ABNORMAL LOW (ref 3.87–5.11)
RDW: 16.8 % — ABNORMAL HIGH (ref 11.5–15.5)
RDW: 17.1 % — ABNORMAL HIGH (ref 11.5–15.5)
WBC: 9.5 10*3/uL (ref 4.0–10.5)
WBC: 7.5 10*3/uL (ref 4.0–10.5)
nRBC: 0 % (ref 0.0–0.2)
nRBC: 0 % (ref 0.0–0.2)

## 2020-12-07 LAB — BASIC METABOLIC PANEL
Anion gap: 6 (ref 5–15)
BUN: 17 mg/dL (ref 8–23)
CO2: 21 mmol/L — ABNORMAL LOW (ref 22–32)
Calcium: 8.1 mg/dL — ABNORMAL LOW (ref 8.9–10.3)
Chloride: 103 mmol/L (ref 98–111)
Creatinine, Ser: 0.65 mg/dL (ref 0.44–1.00)
GFR, Estimated: >60 mL/min (ref >60)
Glucose, Bld: 155 mg/dL — ABNORMAL HIGH (ref 70–99)
Potassium: 3.5 mmol/L (ref 3.5–5.1)
Sodium: 130 mmol/L — ABNORMAL LOW (ref 135–145)

## 2020-12-07 LAB — PREPARE RBC (CROSSMATCH)

## 2020-12-07 MED ORDER — HYDROCODONE-ACETAMINOPHEN 5-325 MG PO TABS: 1.0000 | ORAL_TABLET | ORAL | 0 refills | Status: AC | PRN

## 2020-12-07 MED ORDER — SODIUM CHLORIDE 0.9 % IV SOLN
INTRAVENOUS | Status: DC | PRN
  Administered 2020-12-07: 1000 mL via INTRAVENOUS

## 2020-12-07 MED ORDER — SODIUM CHLORIDE 0.9% IV SOLUTION: Freq: Once | INTRAVENOUS | Status: AC

## 2020-12-07 MED ORDER — ASPIRIN 81 MG PO CHEW: 81.0000 mg | CHEWABLE_TABLET | Freq: Two times a day (BID) | ORAL | 0 refills | Status: AC

## 2020-12-07 NOTE — Evaluation (Signed)
Physical Therapy Evaluation Patient Details Name: Cheryl Maldonado MRN: [id number] DOB: May 07, 1933 Today's Date: 12/07/2020   History of Present Illness  Pt s/p R hip fx and now s/p R THR by anterior direct approach.  Pt with hx of L hip fx 6/20 and breast CA  Clinical Impression  Pt admitted as above and presenting with functional mobility limitations 2* decreased R LE strength/ROM and post op pain.  Pt should progress to dc home with 24/7 assist of family/PCA and would benefit from follow up HHPT to maximize IND and safety following dc.    Follow Up Recommendations Home health PT    Equipment Recommendations  3in1 (PT)    Recommendations for Other Services OT consult     Precautions / Restrictions Precautions Precautions: Fall Restrictions Weight Bearing Restrictions: No LLE Weight Bearing: Weight bearing as tolerated      Mobility  Bed Mobility Overal bed mobility: Needs Assistance Bed Mobility: Supine to Sit     Supine to sit: Min assist     General bed mobility comments: cues for sequence and use of L LE to self assist    Transfers Overall transfer level: Needs assistance Equipment used: Rolling walker (2 wheeled) Transfers: Sit to/from Stand Sit to Stand: Min assist         General transfer comment: cues for LE management and use of UEs to self assist  Ambulation/Gait Ambulation/Gait assistance: Min assist Gait Distance (Feet): 65 Feet Assistive device: Rolling walker (2 wheeled) Gait Pattern/deviations: Step-to pattern;Step-through pattern;Decreased step length - right;Decreased step length - left;Shuffle;Trunk flexed     General Gait Details: cues for sequence, posture, position from RW; physical assist to balance/support and RW management  Stairs            Wheelchair Mobility    Modified Rankin (Stroke Patients Only)       Balance Overall balance assessment: Needs assistance Sitting-balance support: No upper extremity supported;Feet  supported Sitting balance-Leahy Scale: Fair     Standing balance support: Bilateral upper extremity supported Standing balance-Leahy Scale: Poor                               Pertinent Vitals/Pain Pain Assessment: 0-10 Pain Score: 2  Pain Location: R hip Pain Descriptors / Indicators: Aching;Sore Pain Intervention(s): Limited activity within patient's tolerance;Monitored during session;Premedicated before session    [place] expects to be discharged to:: Private residence Living Arrangements: Alone Available Help at Discharge: Available 24 hours/day;Personal care attendant;Family Type of Home: House Home Access: Stairs to enter Entrance Stairs-Rails: Psychiatric nurse of Steps: 4 Home Layout: Able to live on main level with bedroom/bathroom Home Equipment: Walker - 2 wheels;Cane - single point      Prior Function Level of Independence: Independent;Independent with assistive device(s)         Comments: used cane as needed     Hand Dominance        Extremity/Trunk Assessment   Upper Extremity Assessment Upper Extremity Assessment: Overall WFL for tasks assessed    Lower Extremity Assessment Lower Extremity Assessment: RLE deficits/detail RLE Deficits / Details: AAROM at hip to 90 flex and 20 abd; strength at hip 2+/5    Cervical / Trunk Assessment Cervical / Trunk Assessment: Kyphotic  Communication   Communication: No difficulties  Cognition Arousal/Alertness: Awake/alert Behavior During Therapy: WFL for tasks assessed/performed Overall Cognitive Status: History of cognitive impairments - at baseline  General Comments: Very pleasant but initially confused to place      General Comments      Exercises Total Joint Exercises Ankle Circles/Pumps: AROM;Both;15 reps;Supine Quad Sets: AROM;Both;10 reps;Supine Heel Slides: AAROM;Right;20 reps;Supine Hip  ABduction/ADduction: AAROM;Right;15 reps;Supine   Assessment/Plan    PT Assessment Patient needs continued PT services  PT Problem List Decreased strength;Decreased range of motion;Decreased activity tolerance;Decreased balance;Decreased mobility;Decreased knowledge of use of DME;Pain;Decreased cognition       PT Treatment Interventions DME instruction;Gait training;Stair training;Functional mobility training;Therapeutic activities;Therapeutic exercise;Patient/family education    PT Goals (Current goals can be found in the Care Plan section)  Acute Rehab PT Goals Patient Stated Goal: Home regain PT Goal Formulation: With patient Time For Goal Achievement: 12/21/20 Potential to Achieve Goals: Good    Frequency Min 6X/week   Barriers to discharge        Co-evaluation               AM-PAC PT "6 Clicks" Mobility  Outcome Measure Help needed turning from your back to your side while in a flat bed without using bedrails?: A Little Help needed moving from lying on your back to sitting on the side of a flat bed without using bedrails?: A Little Help needed moving to and from a bed to a chair (including a wheelchair)?: A Little Help needed standing up from a chair using your arms (e.g., wheelchair or bedside chair)?: A Little Help needed to walk in hospital room?: A Little Help needed climbing 3-5 steps with a railing? : A Lot 6 Click Score: 17    End of Session Equipment Utilized During Treatment: Gait belt Activity Tolerance: Patient tolerated treatment well Patient left: in chair;with call bell/phone within reach;with chair alarm set;with nursing/sitter in room Nurse Communication: Mobility status PT Visit Diagnosis: Difficulty in walking, not elsewhere classified (R26.2)    Time: 9629-5284 PT Time Calculation (min) (ACUTE ONLY): 29 min   Charges:   PT Evaluation $PT Eval Low Complexity: 1 Low PT Treatments $Therapeutic Exercise: 8-22 mins        Debe Coder PT Acute Rehabilitation Services Pager [phone] Office [phone]   Sama Arauz 12/07/2020, 1:17 PM

## 2020-12-07 NOTE — Progress Notes (Addendum)
PROGRESS NOTE  Sunshine Mackowski ZJI:[id number] DOB: 28-Apr-1933 DOA: 12/05/2020 PCP: Thressa Sheller, MD (Inactive)  HPI/Recap of past 24 hours:  Morene Crocker a 87 y.o.femalewith past medical history of left hip fracture status post arthroplasty presented to the hospital with right hip pain while sustaining a fall walking her dog.  Normally uses a cane for ambulation.In the ED, vitals were stable. X-ray of the right hip showed right femur fracture and orthopedics was consulted.  Patient was then admitted to hospital for hip surgery.  Patient underwent surgery for the right femoral fracture patient underwent surgery for right femoral fracture on December 06, 2020.  Family is planning her being discharged home for home health  Assessment/Plan: Principal Problem:   Closed right hip fracture, initial encounter [facility]) Active Problems:   Essential hypertension   S/P right hip fracture  1.  Closed right hip fracture status post fall.  Patient the Eulis Foster right hip surgery on  December 06, 2020  2.  Right leg lymphedema negative for DVT  3.  Hypertension stable continue amlodipine and Micardis  4.  Hyperlipidemia.  Her simvastatin is currently on hold  Code Status: DNR  Severity of Illness: The appropriate patient status for this patient is INPATIENT. Inpatient status is judged to be reasonable and necessary in order to provide the required intensity of service to ensure the patient's safety. The patient's presenting symptoms, physical exam findings, and initial radiographic and laboratory data in the context of their chronic comorbidities is felt to place them at high risk for further clinical deterioration. Furthermore, it is not anticipated that the patient will be medically stable for discharge from the hospital within 2 midnights of admission. The following factors support the patient status of inpatient.   " The patient's presenting symptoms include fall with hip pain " The worrisome  physical exam findings include right hip fracture. " The initial radiographic and laboratory data are worrisome because of fracture displaced right femoral fracture. " The chronic co-morbidities include dementia.   * I certify that at the point of admission it is my clinical judgment that the patient will require inpatient hospital care spanning beyond 2 midnights from the point of admission due to high intensity of service, high risk for further deterioration and high frequency of surveillance required.*    Family Communication: None  Disposition Plan: Home in 1 to 2 days   Consultants:  Orthopedic  Procedures: Right total hip arthroplasty, anterior approach.  December 06, 2020  Antimicrobials:  2 g of Ancef during surgery  DVT prophylaxis: Lovenox   Objective: Vitals:   12/06/20 2047 12/06/20 2154 12/07/20 0155 12/07/20 0559  BP: (!) 127/52 (!) 109/46 129/63 (!) 120/55  Pulse: 77 74 100 93  Resp: 16 16 16 16   Temp: (!) 97.5 F (36.4 C) 97.7 F (36.5 C) (!) 97.3 F (36.3 C) 98.4 F (36.9 C)  TempSrc: Oral Oral Oral Oral  SpO2: 100% 100% 99% 94%  Weight:      Height:        Intake/Output Summary (Last 24 hours) at 12/07/2020 0902 Last data filed at 12/07/2020 0804 Gross per 24 hour  Intake 1700.24 ml  Output 800 ml  Net 900.24 ml   Filed Weights   12/05/20 1241  Weight: 45.4 kg   Body mass index is 18.89 kg/m.  Exam:  . General: 87 y.o. year-old female well developed well nourished in no acute distress.  Alert and oriented x3.  Pleasant confused occasionally . Cardiovascular: Regular rate  and rhythm with no rubs or gallops.  No thyromegaly or JVD noted.   Marland Kitchen Respiratory: Clear to auscultation with no wheezes or rales. Good inspiratory effort. . Abdomen: Soft nontender nondistended with normal bowel sounds x4 quadrants. . Musculoskeletal: No lower extremity edema. 2/4 pulses in all 4 extremities. . Skin: No ulcerative lesions noted or rashes, . Psychiatry:  Mood is appropriate for condition and setting    Data Reviewed: CBC: Recent Labs  Lab 12/05/20 1320 12/06/20 0312 12/07/20 0314  WBC 13.8* 11.0* 9.5  NEUTROABS 12.8*  --   --   HGB 8.9* 8.3* 7.1*  HCT 28.8* 26.9* 23.4*  MCV 82.1 81.0 83.0  PLT 342 341 [id number]   Basic Metabolic Panel: Recent Labs  Lab 12/05/20 1320 12/06/20 0312 12/07/20 0314  NA 136 136 130*  K 5.8* 3.6 3.5  CL 105 102 103  CO2 20* 22 21*  GLUCOSE 120* 112* 155*  BUN 17 16 17   CREATININE 0.76 0.66 0.65  CALCIUM 8.7* 9.3 8.1*  MG  --  2.2  --   PHOS  --  2.9  --    GFR: Estimated Creatinine Clearance: 36.8 mL/min (by C-G formula based on SCr of 0.65 mg/dL). Liver Function Tests: Recent Labs  Lab 12/06/20 0312  AST 24  ALT 20  ALKPHOS 36*  BILITOT 0.8  PROT 5.6*  ALBUMIN 3.6   No results for input(s): LIPASE, AMYLASE in the last 168 hours. No results for input(s): AMMONIA in the last 168 hours. Coagulation Profile: Recent Labs  Lab 12/06/20 0312  INR 1.1   Cardiac Enzymes: No results for input(s): CKTOTAL, CKMB, CKMBINDEX, TROPONINI in the last 168 hours. BNP (last 3 results) No results for input(s): PROBNP in the last 8760 hours. HbA1C: No results for input(s): HGBA1C in the last 72 hours. CBG: No results for input(s): GLUCAP in the last 168 hours. Lipid Profile: No results for input(s): CHOL, HDL, LDLCALC, TRIG, CHOLHDL, LDLDIRECT in the last 72 hours. Thyroid Function Tests: No results for input(s): TSH, T4TOTAL, FREET4, T3FREE, THYROIDAB in the last 72 hours. Anemia Panel: No results for input(s): VITAMINB12, FOLATE, FERRITIN, TIBC, IRON, RETICCTPCT in the last 72 hours. Urine analysis:    Component Value Date/Time   COLORURINE AMBER (A) 04/11/2019 1751   APPEARANCEUR HAZY (A) 04/11/2019 1751   LABSPEC 1.028 04/11/2019 1751   PHURINE 5.0 04/11/2019 1751   GLUCOSEU NEGATIVE 04/11/2019 1751   HGBUR NEGATIVE 04/11/2019 1751   BILIRUBINUR NEGATIVE 04/11/2019 1751   KETONESUR 5  (A) 04/11/2019 1751   PROTEINUR NEGATIVE 04/11/2019 1751   UROBILINOGEN 0.2 03/01/2010 1859   NITRITE POSITIVE (A) 04/11/2019 1751   LEUKOCYTESUR LARGE (A) 04/11/2019 1751   Sepsis Labs: @LABRCNTIP (procalcitonin:4,lacticidven:4)  ) Recent Results (from the past 240 hour(s))  SARS Coronavirus 2 by RT PCR (hospital order, performed in [place] hospital lab) Nasopharyngeal Nasopharyngeal Swab     Status: None   Collection Time: 12/05/20  2:14 PM   Specimen: Nasopharyngeal Swab  Result Value Ref Range Status   SARS Coronavirus 2 NEGATIVE NEGATIVE Final    Comment: (NOTE) SARS-CoV-2 target nucleic acids are NOT DETECTED.  The SARS-CoV-2 RNA is generally detectable in upper and lower respiratory specimens during the acute phase of infection. The lowest concentration of SARS-CoV-2 viral copies this assay can detect is 250 copies / mL. A negative result does not preclude SARS-CoV-2 infection and should not be used as the sole basis for treatment or other patient management decisions.  A negative result  may occur with improper specimen collection / handling, submission of specimen other than nasopharyngeal swab, presence of viral mutation(s) within the areas targeted by this assay, and inadequate number of viral copies (<250 copies / mL). A negative result must be combined with clinical observations, patient history, and epidemiological information.  Fact Sheet for Patients:   StrictlyIdeas.no  Fact Sheet for Healthcare Providers: BankingDealers.co.za  This test is not yet approved or  cleared by the [place] FDA and has been authorized for detection and/or diagnosis of SARS-CoV-2 by FDA under an Emergency Use Authorization (EUA).  This EUA will remain in effect (meaning this test can be used) for the duration of the COVID-19 declaration under Section 564(b)(1) of the Act, 21 U.S.C. section 360bbb-3(b)(1), unless the authorization  is terminated or revoked sooner.  Performed at [facility], [place] [address], [place], [place] [address]   Surgical PCR screen     Status: None   Collection Time: 12/06/20  6:03 AM   Specimen: Nasal Mucosa; Nasal Swab  Result Value Ref Range Status   MRSA, PCR NEGATIVE NEGATIVE Final   Staphylococcus aureus NEGATIVE NEGATIVE Final    Comment: (NOTE) The Xpert SA Assay (FDA approved for NASAL specimens in patients 27 years of age and older), is one component of a comprehensive surveillance program. It is not intended to diagnose infection nor to guide or monitor treatment. Performed at [facility], [place] [address], [place], [place] [address]       Studies: Pelvis Portable  Result Date: 12/06/2020 CLINICAL DATA:  Postop right total hip arthroplasty EXAM: PORTABLE PELVIS 1-2 VIEWS COMPARISON:  04/13/2019 FINDINGS: Frontal view of the pelvis includes both hips, and excludes the iliac crests by collimation. Previous left hip ORIF. Right hip arthroplasty is in the expected position without signs of acute complication. Postsurgical changes are seen in the overlying soft tissues. IMPRESSION: 1. Unremarkable right hip arthroplasty. Electronically Signed   By: Randa Ngo M.D.   On: 12/06/2020 19:17   DG C-Arm 1-60 Min-No Report  Result Date: 12/06/2020 Fluoroscopy was utilized by the requesting physician.  No radiographic interpretation.   DG HIP OPERATIVE UNILAT W OR W/O PELVIS RIGHT  Result Date: 12/06/2020 CLINICAL DATA:  Right hip arthroplasty EXAM: OPERATIVE choose 2 HIP (WITH PELVIS IF PERFORMED) 1 VIEWS TECHNIQUE: Fluoroscopic spot image(s) were submitted for interpretation post-operatively. COMPARISON:  12/05/2020 FINDINGS: Two fluoroscopic images are obtained during the performance of the procedure and are provided for interpretation only. Images demonstrate placement of a right hip arthroplasty in the expected position without signs of acute  complication. FLUOROSCOPY TIME:  9 seconds IMPRESSION: 1. Right hip arthroplasty as above. Please refer to the operative report. Electronically Signed   By: Randa Ngo M.D.   On: 12/06/2020 17:09    Scheduled Meds: . amLODipine  5 mg Oral Daily  . cholecalciferol  5,000 Units Oral Daily  . docusate sodium  100 mg Oral BID  . enoxaparin (LOVENOX) injection  30 mg Subcutaneous Q24H  . irbesartan  300 mg Oral Daily  . levothyroxine  75 mcg Oral Q0600  . multivitamin with minerals  1 tablet Oral Daily  . mupirocin ointment  1 application Nasal BID  . raloxifene  60 mg Oral Daily    Continuous Infusions: . sodium chloride 10 mL/hr at 12/07/20 0804  . methocarbamol (ROBAXIN) IV    . methocarbamol (ROBAXIN) IV       LOS: 2 days     Cristal Deer,  MD [facility]  To reach me or the doctor on call, go to: [email or web address] Password [email or web address]  12/07/2020, 9:02 AM

## 2020-12-07 NOTE — Plan of Care (Signed)
  Problem: Clinical Measurements: Goal: Will remain free from infection Outcome: Progressing   Problem: Clinical Measurements: Goal: Diagnostic test results will improve Outcome: Progressing   Problem: Clinical Measurements: Goal: Respiratory complications will improve Outcome: Progressing   Problem: Clinical Measurements: Goal: Cardiovascular complication will be avoided Outcome: Progressing   Problem: Nutrition: Goal: Adequate nutrition will be maintained Outcome: Progressing   Problem: Coping: Goal: Level of anxiety will decrease Outcome: Progressing   

## 2020-12-07 NOTE — Plan of Care (Signed)
  Problem: Pain Managment: Goal: General experience of comfort will improve Outcome: Progressing   

## 2020-12-07 NOTE — Progress Notes (Signed)
Physical Therapy Treatment Patient Details Name: Cheryl Maldonado MRN: [id number] DOB: 07-02-1933 Today's Date: 12/07/2020    History of Present Illness Pt s/p R hip fx and now s/p R THR by anterior direct approach.  Pt with hx of L hip fx 6/20 and breast CA    PT Comments    Pt very pleasant and progressing steadily with mobility.  C/o mild dizziness with mobility this date.   Follow Up Recommendations  Home health PT     Equipment Recommendations  3in1 (PT)    Recommendations for Other Services OT consult     Precautions / Restrictions Precautions Precautions: Fall Restrictions Weight Bearing Restrictions: No LLE Weight Bearing: Weight bearing as tolerated    Mobility  Bed Mobility Overal bed mobility: Needs Assistance Bed Mobility: Sit to Supine     Supine to sit: Min assist Sit to supine: Min assist;Mod assist   General bed mobility comments: cues for sequence and use of L LE to self assist  Transfers Overall transfer level: Needs assistance Equipment used: Rolling walker (2 wheeled) Transfers: Sit to/from Stand Sit to Stand: Min assist         General transfer comment: cues for LE management and use of UEs to self assist  Ambulation/Gait Ambulation/Gait assistance: Min assist Gait Distance (Feet): 75 Feet Assistive device: Rolling walker (2 wheeled) Gait Pattern/deviations: Step-to pattern;Step-through pattern;Decreased step length - right;Decreased step length - left;Shuffle;Trunk flexed     General Gait Details: cues for sequence, posture, position from RW; physical assist to balance/support and RW management   Stairs             Wheelchair Mobility    Modified Rankin (Stroke Patients Only)       Balance Overall balance assessment: Needs assistance Sitting-balance support: No upper extremity supported;Feet supported Sitting balance-Leahy Scale: Fair     Standing balance support: Bilateral upper extremity supported Standing  balance-Leahy Scale: Poor                              Cognition Arousal/Alertness: Awake/alert Behavior During Therapy: WFL for tasks assessed/performed Overall Cognitive Status: History of cognitive impairments - at baseline                                 General Comments: Very pleasant but initially confused to place      Exercises Total Joint Exercises Ankle Circles/Pumps: AROM;Both;15 reps;Supine Quad Sets: AROM;Both;10 reps;Supine Heel Slides: AAROM;Right;20 reps;Supine Hip ABduction/ADduction: AAROM;Right;15 reps;Supine    General Comments        Pertinent Vitals/Pain Pain Assessment: 0-10 Pain Score: 2  Pain Location: R hip Pain Descriptors / Indicators: Aching;Sore Pain Intervention(s): Limited activity within patient's tolerance;Monitored during session;Premedicated before session    [place] expects to be discharged to:: Private residence Living Arrangements: Alone Available Help at Discharge: Available 24 hours/day;Personal care attendant;Family Type of Home: House Home Access: Stairs to enter Entrance Stairs-Rails: Right;Left Home Layout: Able to live on main level with bedroom/bathroom Home Equipment: Walker - 2 wheels;Cane - single point      Prior Function Level of Independence: Independent;Independent with assistive device(s)      Comments: used cane as needed   PT Goals (current goals can now be found in the care plan section) Acute Rehab PT Goals Patient Stated Goal: Home regain PT Goal Formulation: With patient Time For Goal Achievement: 12/21/20  Potential to Achieve Goals: Good Progress towards PT goals: Progressing toward goals    Frequency    Min 6X/week      PT Plan Current plan remains appropriate    Co-evaluation              AM-PAC PT "6 Clicks" Mobility   Outcome Measure  Help needed turning from your back to your side while in a flat bed without using bedrails?: A  Little Help needed moving from lying on your back to sitting on the side of a flat bed without using bedrails?: A Little Help needed moving to and from a bed to a chair (including a wheelchair)?: A Little Help needed standing up from a chair using your arms (e.g., wheelchair or bedside chair)?: A Little Help needed to walk in hospital room?: A Little Help needed climbing 3-5 steps with a railing? : A Lot 6 Click Score: 17    End of Session Equipment Utilized During Treatment: Gait belt Activity Tolerance: Patient tolerated treatment well Patient left: in bed;with call bell/phone within reach;with bed alarm set;with family/visitor present Nurse Communication: Mobility status PT Visit Diagnosis: Difficulty in walking, not elsewhere classified (R26.2)     Time: 1740-8144 PT Time Calculation (min) (ACUTE ONLY): 16 min  Charges:  $Gait Training: 8-22 mins $Therapeutic Exercise: 8-22 mins                     [place] Pager [phone] Office [phone]    Hawken Bielby 12/07/2020, 1:25 PM

## 2020-12-07 NOTE — TOC Initial Note (Addendum)
Transition of Care [facility] [place]) - Initial/Assessment Note    Patient Details  Name: Cheryl Maldonado MRN: [id number] Date of Birth: Sep 15, 1933  Transition of Care (TOC) CM/SW Contact:    Joaquin Courts, RN Phone Number: 12/07/2020, 3:03 PM  Clinical Narrative:                 CM spoke with patient's son re discharge planning.  [place] set up for HHPT.  Per chart patient received 3in1 in 2020 from [employer], son reports he is not sure if they still have that equipment at home.  CM asks that son confirm, if patient does not have 67in1, another can be ordered but more than likely will have an out of pocket cost due insurance paying for one in 2020.  TOC will continue to follow.  Expected Discharge Plan: [place] Barriers to Discharge: Continued Medical Work up   Patient Goals and CMS Choice Patient states their goals for this hospitalization and ongoing recovery are:: to go home CMS Medicare.gov Compare Post Acute Care list provided to:: Patient Represenative (must comment) Choice offered to / list presented to : Adult Children  Expected Discharge Plan and Services Expected Discharge Plan: [place]   Discharge Planning Services: CM Consult Post Acute Care Choice: [place] arrangements for the past 2 months: [place]: PT [place]: [place] Date [place]: 12/07/20 Time HH Agency Contacted: [id number] Representative spoke with at [place]: [place] Arrangements/Services Living arrangements for the past 2 months: [place]   Patient language and need for interpreter reviewed:: Yes Do you feel safe going back to the place where you live?: Yes      Need for Family Participation in Patient Care: Yes (Comment) Care giver support system in place?: Yes (comment)   Criminal Activity/Legal Involvement Pertinent to Current Situation/Hospitalization: No -  Comment as needed  Activities of Daily Living Home Assistive Devices/Equipment: Cane (specify quad or straight),Bedside commode/3-in-1 (single point cane) ADL Screening (condition at time of admission) Patient's cognitive ability adequate to safely complete daily activities?: Yes Is the patient deaf or have difficulty hearing?: No Does the patient have difficulty seeing, even when wearing glasses/contacts?: No Does the patient have difficulty concentrating, remembering, or making decisions?: No Patient able to express need for assistance with ADLs?: Yes Does the patient have difficulty dressing or bathing?: Yes Independently performs ADLs?: No Communication: Independent Dressing (OT): Needs assistance Is this a change from baseline?: Pre-admission baseline Grooming: Needs assistance Is this a change from baseline?: Pre-admission baseline Feeding: Needs assistance Is this a change from baseline?: Pre-admission baseline Bathing: Needs assistance Is this a change from baseline?: Pre-admission baseline Toileting: Dependent Is this a change from baseline?: Change from baseline, expected to last >3days In/Out Bed: Dependent Is this a change from baseline?: Change from baseline, expected to last >3 days Walks in Home: Dependent Is this a change from baseline?: Change from baseline, expected to last >3 days Does the patient have difficulty walking or climbing stairs?: Yes Weakness of Legs: Right Weakness of Arms/Hands: None  Permission Sought/Granted                  Emotional Assessment Appearance:: Appears stated age         Psych Involvement:  No (comment)  Admission diagnosis:  Fall, initial encounter [W19.XXXA] Closed displaced fracture of right femoral neck ([place]) [S72.001A] Closed transcervical fracture of right femur, initial encounter ([place]) [R42.706C] S/P right hip fracture [Z87.81] Patient Active Problem List   Diagnosis Date Noted  . Closed right hip fracture,  initial encounter ([place]) 12/05/2020  . S/P right hip fracture 12/05/2020  . Closed comminuted intertrochanteric fracture of left femur ([place]) 04/13/2019  . Intertrochanteric fracture ([place]) 04/11/2019  . Essential hypertension 04/11/2019  . Anemia 04/11/2019  . Intertrochanteric fracture of left hip ([place]) 04/11/2019   PCP:  Thressa Sheller, MD (Inactive) Pharmacy:   [place], [place] [address] Phone: [phone] Fax: [phone]     Social Determinants of Health (SDOH) Interventions    Readmission Risk Interventions No flowsheet data found.

## 2020-12-07 NOTE — Plan of Care (Signed)
?  Problem: Education: ?Goal: Knowledge of General Education information will improve ?Description: Including pain rating scale, medication(s)/side effects and non-pharmacologic comfort measures ?Outcome: Progressing ?  ?Problem: Health Behavior/Discharge Planning: ?Goal: Ability to manage health-related needs will improve ?Outcome: Progressing ?  ?Problem: Coping: ?Goal: Level of anxiety will decrease ?Outcome: Progressing ?  ?

## 2020-12-07 NOTE — Progress Notes (Signed)
Cheryl Maldonado effective on Sunday 12-07-20 will be the new designated visitor. His phone number is [phone].

## 2020-12-07 NOTE — Progress Notes (Signed)
    Subjective:  Patient reports pain as mild to moderate.  Denies N/V/CP/SOB.   Objective:   VITALS:   Vitals:   12/06/20 2047 12/06/20 2154 12/07/20 0155 12/07/20 0559  BP: (!) 127/52 (!) 109/46 129/63 (!) 120/55  Pulse: 77 74 100 93  Resp: 16 16 16 16   Temp: (!) 97.5 F (36.4 C) 97.7 F (36.5 C) (!) 97.3 F (36.3 C) 98.4 F (36.9 C)  TempSrc: Oral Oral Oral Oral  SpO2: 100% 100% 99% 94%  Weight:      Height:        NAD ABD soft Neurovascular intact Sensation intact distally Intact pulses distally Dorsiflexion/Plantar flexion intact Incision: dressing C/D/I   Lab Results  Component Value Date   WBC 9.5 12/07/2020   HGB 7.1 (L) 12/07/2020   HCT 23.4 (L) 12/07/2020   MCV 83.0 12/07/2020   PLT 273 12/07/2020   BMET    Component Value Date/Time   NA 130 (L) 12/07/2020 0314   K 3.5 12/07/2020 0314   CL 103 12/07/2020 0314   CO2 21 (L) 12/07/2020 0314   GLUCOSE 155 (H) 12/07/2020 0314   BUN 17 12/07/2020 0314   CREATININE 0.65 12/07/2020 0314   CALCIUM 8.1 (L) 12/07/2020 0314   GFRNONAA >60 12/07/2020 0314   GFRAA >60 04/15/2019 0456     Assessment/Plan: 1 Day Post-Op   Principal Problem:   Closed right hip fracture, initial encounter (HCC) Active Problems:   Essential hypertension   S/P right hip fracture   WBAT with walker DVT ppx: Lovenox, SCDs, TEDS     ASA 81MG  BID at D/C PO pain control PT/OT ABLA: HgB 7.1, continue to monitor and treat per hospitalist recommendations Dispo: Pending clearance by PT/OT from an orthopedic standpoint. Patient would prefer to D/C home and states she has someone who can stay with her for a week     Dorothyann Peng 12/07/2020, 7:43 AM [place] is now [employer] [address], [place], [place], [place] [address] Phone: [phone] [email or web address]  [employer]

## 2020-12-08 DIAGNOSIS — S72001A Fracture of unspecified part of neck of right femur, initial encounter for closed fracture: Secondary | ICD-10-CM | POA: Diagnosis not present

## 2020-12-08 DIAGNOSIS — C50919 Malignant neoplasm of unspecified site of unspecified female breast: Secondary | ICD-10-CM | POA: Diagnosis present

## 2020-12-08 LAB — BPAM RBC
Blood Product Expiration Date: 202202252359
ISSUE DATE / TIME: 202202052259
Unit Type and Rh: 6200

## 2020-12-08 LAB — CBC WITH DIFFERENTIAL/PLATELET
Abs Immature Granulocytes: 0.03 10*3/uL (ref 0.00–0.07)
Basophils Absolute: 0 10*3/uL (ref 0.0–0.1)
Basophils Relative: 0 %
Eosinophils Absolute: 0 10*3/uL (ref 0.0–0.5)
Eosinophils Relative: 1 %
HCT: 24.8 % — ABNORMAL LOW (ref 36.0–46.0)
Hemoglobin: 7.9 g/dL — ABNORMAL LOW (ref 12.0–15.0)
Immature Granulocytes: 0 %
Lymphocytes Relative: 18 %
Lymphs Abs: 1.3 10*3/uL (ref 0.7–4.0)
MCH: 26.2 pg (ref 26.0–34.0)
MCHC: 31.9 g/dL (ref 30.0–36.0)
MCV: 82.1 fL (ref 80.0–100.0)
Monocytes Absolute: 0.9 10*3/uL (ref 0.1–1.0)
Monocytes Relative: 12 %
Neutro Abs: 5 10*3/uL (ref 1.7–7.7)
Neutrophils Relative %: 69 %
Platelets: 236 10*3/uL (ref 150–400)
RBC: 3.02 MIL/uL — ABNORMAL LOW (ref 3.87–5.11)
RDW: 16.8 % — ABNORMAL HIGH (ref 11.5–15.5)
WBC: 7.3 10*3/uL (ref 4.0–10.5)
nRBC: 0.3 % — ABNORMAL HIGH (ref 0.0–0.2)

## 2020-12-08 LAB — BASIC METABOLIC PANEL
Anion gap: 7 (ref 5–15)
BUN: 22 mg/dL (ref 8–23)
CO2: 23 mmol/L (ref 22–32)
Calcium: 8 mg/dL — ABNORMAL LOW (ref 8.9–10.3)
Chloride: 103 mmol/L (ref 98–111)
Creatinine, Ser: 0.8 mg/dL (ref 0.44–1.00)
GFR, Estimated: >60 mL/min (ref >60)
Glucose, Bld: 107 mg/dL — ABNORMAL HIGH (ref 70–99)
Potassium: 3.8 mmol/L (ref 3.5–5.1)
Sodium: 133 mmol/L — ABNORMAL LOW (ref 135–145)

## 2020-12-08 LAB — TYPE AND SCREEN
ABO/RH(D): A POS
Antibody Screen: NEGATIVE
Unit division: 0

## 2020-12-08 MED ORDER — DOCUSATE SODIUM 100 MG PO CAPS: 100.0000 mg | ORAL_CAPSULE | Freq: Two times a day (BID) | ORAL | 0 refills | Status: DC

## 2020-12-08 MED ORDER — MENTHOL 3 MG MT LOZG: 1.0000 | LOZENGE | OROMUCOSAL | 12 refills | Status: AC | PRN

## 2020-12-08 MED ORDER — MUPIROCIN 2 % EX OINT: 1.0000 "application " | TOPICAL_OINTMENT | Freq: Two times a day (BID) | CUTANEOUS | 0 refills | Status: DC

## 2020-12-08 MED ORDER — ACETAMINOPHEN 325 MG PO TABS: 325.0000 mg | ORAL_TABLET | Freq: Four times a day (QID) | ORAL | 0 refills | Status: DC | PRN

## 2020-12-08 NOTE — Progress Notes (Signed)
Physical Therapy Treatment Patient Details Name: Cheryl Maldonado MRN: [id number] DOB: [birth year] Today's Date: 12/08/2020    History of Present Illness Pt s/p R hip fx and now s/p R THR by anterior direct approach.  Pt with hx of L hip fx 6/20 and breast CA    PT Comments    Pt in good spirits and eager to return home.  Pt up to ambulate in hall and negotiate stairs.  Pt/s son present to observe. Many questions asked and answered.  Follow Up Recommendations  Home health PT     Equipment Recommendations  3in1 (PT)    Recommendations for Other Services OT consult     Precautions / Restrictions Precautions Precautions: Fall Restrictions Weight Bearing Restrictions: No LLE Weight Bearing: Weight bearing as tolerated    Mobility  Bed Mobility               General bed mobility comments: Pt up in chair and requests back to same  Transfers Overall transfer level: Needs assistance Equipment used: Rolling walker (2 wheeled) Transfers: Sit to/from Stand Sit to Stand: Min guard         General transfer comment: cues for LE management and use of UEs to self assist  Ambulation/Gait Ambulation/Gait assistance: Min guard Gait Distance (Feet): 100 Feet Assistive device: Rolling walker (2 wheeled) Gait Pattern/deviations: Step-to pattern;Step-through pattern;Decreased step length - right;Decreased step length - left;Shuffle;Trunk flexed     General Gait Details: cues for posture, position from RW and initial sequence   Stairs Stairs: Yes Stairs assistance: Min assist Stair Management: One rail Right;Step to pattern;Forwards;With cane Number of Stairs: 4 General stair comments: 2 steps twice with rail, cane, and cues for sequence and foot/cane placement.   Wheelchair Mobility    Modified Rankin (Stroke Patients Only)       Balance Overall balance assessment: Needs assistance Sitting-balance support: No upper extremity supported;Feet supported Sitting  balance-Leahy Scale: Good     Standing balance support: Bilateral upper extremity supported Standing balance-Leahy Scale: Poor                              Cognition Arousal/Alertness: Awake/alert Behavior During Therapy: WFL for tasks assessed/performed Overall Cognitive Status: History of cognitive impairments - at baseline                                        Exercises      General Comments        Pertinent Vitals/Pain Pain Assessment: 0-10 Pain Score: 4  Pain Location: R hip with activity Pain Descriptors / Indicators: Aching;Sore Pain Intervention(s): Limited activity within patient's tolerance;Monitored during session;Premedicated before session;Ice applied    Home Living                      Prior Function            PT Goals (current goals can now be found in the care plan section) Acute Rehab PT Goals Patient Stated Goal: Regain IND PT Goal Formulation: With patient Time For Goal Achievement: 12/21/20 Potential to Achieve Goals: Good Progress towards PT goals: Progressing toward goals    Frequency    Min 6X/week      PT Plan Current plan remains appropriate    Co-evaluation  AM-PAC PT "6 Clicks" Mobility   Outcome Measure  Help needed turning from your back to your side while in a flat bed without using bedrails?: A Little Help needed moving from lying on your back to sitting on the side of a flat bed without using bedrails?: A Little Help needed moving to and from a bed to a chair (including a wheelchair)?: A Little Help needed standing up from a chair using your arms (e.g., wheelchair or bedside chair)?: A Little Help needed to walk in hospital room?: A Little Help needed climbing 3-5 steps with a railing? : A Little 6 Click Score: 18    End of Session Equipment Utilized During Treatment: Gait belt Activity Tolerance: Patient tolerated treatment well Patient left: in chair;with call  bell/phone within reach;with chair alarm set;with family/visitor present Nurse Communication: Mobility status PT Visit Diagnosis: Difficulty in walking, not elsewhere classified (R26.2)     Time: 6144-3154 PT Time Calculation (min) (ACUTE ONLY): 33 min  Charges:  $Gait Training: 8-22 mins $Therapeutic Activity: 8-22 mins                     [place] Pager [phone] Office [phone]    Ralynn San 12/08/2020, 4:31 PM

## 2020-12-08 NOTE — Progress Notes (Signed)
CRITICAL VALUE ALERT  Critical Value: Hemoglobin: 6.1  Date & Time Notified:  Received the call from lab at 8:39 PM on 12/07/20.   Provider Notified: Rufina Falco, NP ([facility]) was notified at 8:59 PM.   Orders Received/Actions taken:  Received orders from Rufina Falco, NP to transfuse 1 unit of blood.   Will assess patient's IV site for patency and transfuse 1 unit of blood when it is ready.

## 2020-12-08 NOTE — Plan of Care (Signed)
Pt discharged all care plans met 

## 2020-12-08 NOTE — Progress Notes (Signed)
Physical Therapy Treatment Patient Details Name: Cheryl Maldonado MRN: [id number] DOB: 1932/11/12 Today's Date: 12/08/2020    History of Present Illness Pt s/p R hip fx and now s/p R THR by anterior direct approach.  Pt with hx of L hip fx 6/20 and breast CA    PT Comments    Pt in good spirits and progressing with mobility but with increased pain.  Pain meds requested.  Will follow up for stair training when son arrives.     Follow Up Recommendations  Home health PT     Equipment Recommendations  3in1 (PT)    Recommendations for Other Services OT consult     Precautions / Restrictions Precautions Precautions: Fall Restrictions Weight Bearing Restrictions: No LLE Weight Bearing: Weight bearing as tolerated    Mobility  Bed Mobility Overal bed mobility: Needs Assistance Bed Mobility: Supine to Sit     Supine to sit: Min guard     General bed mobility comments: cues for sequence and use of L LE to self assist  Transfers Overall transfer level: Needs assistance Equipment used: Rolling walker (2 wheeled) Transfers: Sit to/from Stand Sit to Stand: Min guard         General transfer comment: cues for LE management and use of UEs to self assist  Ambulation/Gait Ambulation/Gait assistance: Min guard Gait Distance (Feet): 100 Feet Assistive device: Rolling walker (2 wheeled) Gait Pattern/deviations: Step-to pattern;Step-through pattern;Decreased step length - right;Decreased step length - left;Shuffle;Trunk flexed     General Gait Details: cues for posture, position from RW and initial sequence   Stairs             Wheelchair Mobility    Modified Rankin (Stroke Patients Only)       Balance Overall balance assessment: Needs assistance Sitting-balance support: No upper extremity supported;Feet supported Sitting balance-Leahy Scale: Good     Standing balance support: Bilateral upper extremity supported Standing balance-Leahy Scale: Poor                               Cognition Arousal/Alertness: Awake/alert Behavior During Therapy: WFL for tasks assessed/performed Overall Cognitive Status: History of cognitive impairments - at baseline                                        Exercises Total Joint Exercises Ankle Circles/Pumps: AROM;Both;15 reps;Supine Quad Sets: AROM;Both;10 reps;Supine Heel Slides: AAROM;Right;20 reps;Supine Hip ABduction/ADduction: AAROM;Right;15 reps;Supine    General Comments        Pertinent Vitals/Pain Pain Assessment: 0-10 Pain Score: 6  Pain Location: R hip with activity Pain Descriptors / Indicators: Aching;Sore Pain Intervention(s): Limited activity within patient's tolerance;Monitored during session;Patient requesting pain meds-RN notified;Ice applied    Home Living                      Prior Function            PT Goals (current goals can now be found in the care plan section) Acute Rehab PT Goals Patient Stated Goal: Home regain PT Goal Formulation: With patient Time For Goal Achievement: 12/21/20 Potential to Achieve Goals: Good Progress towards PT goals: Progressing toward goals    Frequency    Min 6X/week      PT Plan Current plan remains appropriate    Co-evaluation  AM-PAC PT "6 Clicks" Mobility   Outcome Measure  Help needed turning from your back to your side while in a flat bed without using bedrails?: A Little Help needed moving from lying on your back to sitting on the side of a flat bed without using bedrails?: A Little Help needed moving to and from a bed to a chair (including a wheelchair)?: A Little Help needed standing up from a chair using your arms (e.g., wheelchair or bedside chair)?: A Little Help needed to walk in hospital room?: A Little Help needed climbing 3-5 steps with a railing? : A Lot 6 Click Score: 17    End of Session Equipment Utilized During Treatment: Gait belt Activity Tolerance:  Patient tolerated treatment well Patient left: in chair;with call bell/phone within reach;with chair alarm set Nurse Communication: Mobility status PT Visit Diagnosis: Difficulty in walking, not elsewhere classified (R26.2)     Time: 0375-4360 PT Time Calculation (min) (ACUTE ONLY): 30 min  Charges:  $Gait Training: 8-22 mins $Therapeutic Exercise: 8-22 mins                     [place] Pager [phone] Office [phone]    Cheryl Maldonado 12/08/2020, 9:00 AM

## 2020-12-08 NOTE — Discharge Summary (Signed)
Discharge Summary  Cheryl Maldonado OHY:[id number] DOB: 05-Jul-1933  PCP: Thressa Sheller, MD (Inactive)  Admit date: 12/05/2020 Discharge date: 12/08/2020  Time spent: 32 minutes  Recommendations for Outpatient Follow-up:  1. Orthopedic  Discharge Diagnoses:  Active Hospital Problems   Diagnosis Date Noted  . Closed right hip fracture, initial encounter ([place]) 12/05/2020  . Breast cancer ([place])   . S/P right hip fracture 12/05/2020  . Essential hypertension 04/11/2019    Resolved Hospital Problems  No resolved problems to display.    Discharge Condition: Improved  Diet recommendation: Regular  Vitals:   12/08/20 0948 12/08/20 1336  BP: (!) 98/44 (!) 110/43  Pulse: 62 61  Resp: 16 16  Temp: 98.3 F (36.8 C) 98.6 F (37 C)  SpO2: 97% 98%    History of present illness:   Cheryl Maldonado a 87 y.o.femalewith past medical history of left hip fracture status post arthroplasty presented to the hospital with right hip painwhile sustaining a fall walking her dog. Normally uses a cane for ambulation.In the ED, vitals were stable. X-ray of the right hip showed right femur fracture and orthopedics was consulted.Patient was then admitted to hospital for hip surgery.  Patient underwent surgery for the right femoral fracture patient underwent surgery for right femoral fracture on December 06, 2020.  Family is planning her being discharged home for home health   Hospital Course:  Principal Problem:   Closed right hip fracture, initial encounter [facility]) Active Problems:   Essential hypertension   S/P right hip fracture   Breast cancer ([place]) .  Closed right hip fracture status post fall.  Patient underwent right hip surgery on  December 06, 2020  2.  Right leg lymphedema negative for DVT  3.  Hypertension stable continue amlodipine and Micardis  4.  Hyperlipidemia.  Her simvastatin is currently on hold  Patient was discharged home with home health and physical therapy  Code  Status: DNR  Procedures:  Righttotal hip arthroplasty, anterior approach.  December 06, 2020 Physical therapy  Consultations:  Orthopedic  Discharge Exam: BP (!) 110/43 (BP Location: Left Arm)   Pulse 61   Temp 98.6 F (37 C)   Resp 16   Ht 5\' 1"  (1.549 m)   Wt 45.4 kg   SpO2 98%   BMI 18.89 kg/m   General: Alert oriented x3 pleasant no distress Cardiovascular: Regular rhythm no murmur Respiratory: Clear to auscultation  Discharge Instructions You were cared for by a hospitalist during your hospital stay. If you have any questions about your discharge medications or the care you received while you were in the hospital after you are discharged, you can call the unit and asked to speak with the hospitalist on call if the hospitalist that took care of you is not available. Once you are discharged, your primary care physician will handle any further medical issues. Please note that NO REFILLS for any discharge medications will be authorized once you are discharged, as it is imperative that you return to your primary care physician (or establish a relationship with a primary care physician if you do not have one) for your aftercare needs so that they can reassess your need for medications and monitor your lab values.  Discharge Instructions    Call MD for:  severe uncontrolled pain   Complete by: As directed    Call MD for:  severe uncontrolled pain   Complete by: As directed    Call MD for:  temperature >100.4   Complete by: As  directed    Call MD for:  temperature >100.4   Complete by: As directed    Diet - low sodium heart healthy   Complete by: As directed    Discharge instructions   Complete by: As directed    WBAT with walker DVT ppx: Lovenox, SCDs, TEDS     ASA 81MG  BID at D/C PO pain control PT/OT   Discharge instructions   Complete by: As directed    WBAT with walker F/u ortho   Discharge wound care:   Complete by: As directed    Per orthopedic   Discharge  wound care:   Complete by: As directed    Per ortho   Increase activity slowly   Complete by: As directed    WBAT with walker   ASA 81MG  BID at D/C PO pain control PT/OT   Increase activity slowly   Complete by: As directed    WBAT with walker     Allergies as of 12/08/2020   No Known Allergies     Medication List    TAKE these medications   acetaminophen 325 MG tablet Commonly known as: TYLENOL Take 1-2 tablets (325-650 mg total) by mouth every 6 (six) hours as needed for mild pain (pain score 1-3 or temp > 100.5).   amLODipine 5 MG tablet Commonly known as: NORVASC Take 5 mg by mouth daily.   aspirin 81 MG chewable tablet Commonly known as: Aspirin Childrens Chew 1 tablet (81 mg total) by mouth 2 (two) times daily with a meal.   docusate sodium 100 MG capsule Commonly known as: COLACE Take 1 capsule (100 mg total) by mouth 2 (two) times daily.   HYDROcodone-acetaminophen 5-325 MG tablet Commonly known as: NORCO/VICODIN Take 1-2 tablets by mouth every 4 (four) hours as needed for up to 7 days for moderate pain (pain score 4-6). What changed: how much to take   levothyroxine 75 MCG tablet Commonly known as: SYNTHROID Take 75 mcg by mouth daily before breakfast.   menthol-cetylpyridinium 3 MG lozenge Commonly known as: CEPACOL Take 1 lozenge (3 mg total) by mouth as needed for sore throat.   Multivitamin Adult Tabs Take 1 tablet by mouth daily.   mupirocin ointment 2 % Commonly known as: BACTROBAN Place 1 application into the nose 2 (two) times daily.   oxymetazoline 0.05 % nasal spray Commonly known as: AFRIN Place 1 spray into both nostrils daily as needed for congestion.   raloxifene 60 MG tablet Commonly known as: EVISTA Take 60 mg by mouth daily.   simvastatin 5 MG tablet Commonly known as: ZOCOR Take 5 mg by mouth every other day.   telmisartan 80 MG tablet Commonly known as: MICARDIS Take 80 mg by mouth daily.   vitamin C 500 MG  tablet Commonly known as: ASCORBIC ACID Take 500 mg by mouth daily.   Vitamin D 125 MCG (5000 UT) Caps Take 5,000 Units by mouth daily.            Durable Medical Equipment  (From admission, onward)         Start     Ordered   12/07/20 1528  DME Walker  Once       Question Answer Comment  Walker: With 5 Inch Wheels   Patient needs a walker to treat with the following condition Intertrochanteric fracture of left hip, closed, initial encounter (HCC)      12/07/20 1531   12/07/20 1523  DME 3-in-1  Once  12/07/20 1531   12/07/20 1317  For home use only DME 3 n 1  Once        12/07/20 1316           Discharge Care Instructions  (From admission, onward)         Start     Ordered   12/08/20 0000  Discharge wound care:       Comments: Per ortho   12/08/20 1410   12/07/20 0000  Discharge wound care:       Comments: Per orthopedic   12/07/20 1531         No Known Allergies  Follow-up Information    Rod Can, MD. Schedule an appointment as soon as possible for a visit in 2 weeks.   Specialty: Orthopedic Surgery Why: For suture removal, For wound re-check Contact information: [address] [place] [address] [phone]        Care, [facility] Follow up.   Specialty: [place] Why: agency will provide home health physical therapy Contact information: [place] [address] [phone]                The results of significant diagnostics from this hospitalization (including imaging, microbiology, ancillary and laboratory) are listed below for reference.    Significant Diagnostic Studies: Pelvis Portable  Result Date: 12/06/2020 CLINICAL DATA:  Postop right total hip arthroplasty EXAM: PORTABLE PELVIS 1-2 VIEWS COMPARISON:  04/13/2019 FINDINGS: Frontal view of the pelvis includes both hips, and excludes the iliac crests by collimation. Previous left hip ORIF. Right hip  arthroplasty is in the expected position without signs of acute complication. Postsurgical changes are seen in the overlying soft tissues. IMPRESSION: 1. Unremarkable right hip arthroplasty. Electronically Signed   By: Randa Ngo M.D.   On: 12/06/2020 19:17   DG Knee Right Port  Result Date: 12/05/2020 CLINICAL DATA:  Fall EXAM: PORTABLE RIGHT KNEE - 1-2 VIEW COMPARISON:  None. FINDINGS: No evidence of fracture, dislocation, or joint effusion. No evidence of arthropathy or other focal bone abnormality. Soft tissues are unremarkable. IMPRESSION: Negative. Electronically Signed   By: Franchot Gallo M.D.   On: 12/05/2020 17:58   DG C-Arm 1-60 Min-No Report  Result Date: 12/06/2020 Fluoroscopy was utilized by the requesting physician.  No radiographic interpretation.   DG HIP OPERATIVE UNILAT W OR W/O PELVIS RIGHT  Result Date: 12/06/2020 CLINICAL DATA:  Right hip arthroplasty EXAM: OPERATIVE choose 2 HIP (WITH PELVIS IF PERFORMED) 1 VIEWS TECHNIQUE: Fluoroscopic spot image(s) were submitted for interpretation post-operatively. COMPARISON:  12/05/2020 FINDINGS: Two fluoroscopic images are obtained during the performance of the procedure and are provided for interpretation only. Images demonstrate placement of a right hip arthroplasty in the expected position without signs of acute complication. FLUOROSCOPY TIME:  9 seconds IMPRESSION: 1. Right hip arthroplasty as above. Please refer to the operative report. Electronically Signed   By: Randa Ngo M.D.   On: 12/06/2020 17:09   DG Hip Unilat With Pelvis 2-3 Views Right  Result Date: 12/05/2020 CLINICAL DATA:  Pain EXAM: DG HIP (WITH OR WITHOUT PELVIS) 2-3V RIGHT COMPARISON:  None. FINDINGS: There is an acute, displaced, transcervical fracture of the proximal right femur. There is no dislocation. The patient is status post prior intramedullary nail placement through the left femur. There are degenerative changes throughout the lower lumbar spine.  IMPRESSION: Acute, displaced, transcervical fracture of the proximal right femur. Electronically Signed   By: Harrell Gave  Green M.D.   On: 12/05/2020 16:04   VAS [place] LOWER EXTREMITY VENOUS (DVT) (ONLY [facility] & [facility])  Result Date: 12/05/2020  Lower Venous DVT Study Indications: Swelling.  Risk Factors: None identified. Limitations: Poor ultrasound/tissue interface. Comparison Study: No prior studies. Performing Technologist: Oliver Hum RVT  Examination Guidelines: A complete evaluation includes B-mode imaging, spectral Doppler, color Doppler, and power Doppler as needed of all accessible portions of each vessel. Bilateral testing is considered an integral part of a complete examination. Limited examinations for reoccurring indications may be performed as noted. The reflux portion of the exam is performed with the patient in reverse Trendelenburg.  +---------+---------------+---------+-----------+----------+-------------------+ RIGHT    CompressibilityPhasicitySpontaneityPropertiesThrombus Aging      +---------+---------------+---------+-----------+----------+-------------------+ CFV      Full           Yes      Yes                                      +---------+---------------+---------+-----------+----------+-------------------+ SFJ      Full                                                             +---------+---------------+---------+-----------+----------+-------------------+ FV Prox  Full                                                             +---------+---------------+---------+-----------+----------+-------------------+ FV Mid   Full                                                             +---------+---------------+---------+-----------+----------+-------------------+ FV DistalFull                                                             +---------+---------------+---------+-----------+----------+-------------------+ PFV      Full                                                              +---------+---------------+---------+-----------+----------+-------------------+ POP      Full           Yes      Yes                                      +---------+---------------+---------+-----------+----------+-------------------+ PTV      Full                                                             +---------+---------------+---------+-----------+----------+-------------------+  PERO                                                  Not well visualized +---------+---------------+---------+-----------+----------+-------------------+   +----+---------------+---------+-----------+----------+--------------+ LEFTCompressibilityPhasicitySpontaneityPropertiesThrombus Aging +----+---------------+---------+-----------+----------+--------------+ CFV Full           Yes      Yes                                 +----+---------------+---------+-----------+----------+--------------+     Summary: RIGHT: - There is no evidence of deep vein thrombosis in the lower extremity. However, portions of this examination were limited- see technologist comments above.  - No cystic structure found in the popliteal fossa.  LEFT: - No evidence of common femoral vein obstruction.  *See table(s) above for measurements and observations. Electronically signed by Jamelle Haring on 12/05/2020 at 3:44:58 PM.    Final     Microbiology: Recent Results (from the past 240 hour(s))  SARS Coronavirus 2 by RT PCR (hospital order, performed in [facility] hospital lab) Nasopharyngeal Nasopharyngeal Swab     Status: None   Collection Time: 12/05/20  2:14 PM   Specimen: Nasopharyngeal Swab  Result Value Ref Range Status   SARS Coronavirus 2 NEGATIVE NEGATIVE Final    Comment: (NOTE) SARS-CoV-2 target nucleic acids are NOT DETECTED.  The SARS-CoV-2 RNA is generally detectable in upper and lower respiratory specimens during the acute phase of infection. The  lowest concentration of SARS-CoV-2 viral copies this assay can detect is 250 copies / mL. A negative result does not preclude SARS-CoV-2 infection and should not be used as the sole basis for treatment or other patient management decisions.  A negative result may occur with improper specimen collection / handling, submission of specimen other than nasopharyngeal swab, presence of viral mutation(s) within the areas targeted by this assay, and inadequate number of viral copies (<250 copies / mL). A negative result must be combined with clinical observations, patient history, and epidemiological information.  Fact Sheet for Patients:   StrictlyIdeas.no  Fact Sheet for Healthcare Providers: BankingDealers.co.za  This test is not yet approved or  cleared by the [place] FDA and has been authorized for detection and/or diagnosis of SARS-CoV-2 by FDA under an Emergency Use Authorization (EUA).  This EUA will remain in effect (meaning this test can be used) for the duration of the COVID-19 declaration under Section 564(b)(1) of the Act, 21 U.S.C. section 360bbb-3(b)(1), unless the authorization is terminated or revoked sooner.  Performed at [facility], [place] [address] [place], [place] [address]   Surgical PCR screen     Status: None   Collection Time: 12/06/20  6:03 AM   Specimen: Nasal Mucosa; Nasal Swab  Result Value Ref Range Status   MRSA, PCR NEGATIVE NEGATIVE Final   Staphylococcus aureus NEGATIVE NEGATIVE Final    Comment: (NOTE) The Xpert SA Assay (FDA approved for NASAL specimens in patients 27 years of age and older), is one component of a comprehensive surveillance program. It is not intended to diagnose infection nor to guide or monitor treatment. Performed at [facility], [place] [address], [place], [place] [address]      Labs: Basic Metabolic Panel: Recent Labs  Lab  12/05/20 1320 12/06/20 0312 12/07/20 0314 12/08/20 0318  NA 136  136 130* 133*  K 5.8* 3.6 3.5 3.8  CL 105 102 103 103  CO2 20* 22 21* 23  GLUCOSE 120* 112* 155* 107*  BUN 17 16 17 22   CREATININE 0.76 0.66 0.65 0.80  CALCIUM 8.7* 9.3 8.1* 8.0*  MG  --  2.2  --   --   PHOS  --  2.9  --   --    Liver Function Tests: Recent Labs  Lab 12/06/20 0312  AST 24  ALT 20  ALKPHOS 36*  BILITOT 0.8  PROT 5.6*  ALBUMIN 3.6   No results for input(s): LIPASE, AMYLASE in the last 168 hours. No results for input(s): AMMONIA in the last 168 hours. CBC: Recent Labs  Lab 12/05/20 1320 12/06/20 0312 12/07/20 0314 12/07/20 1917 12/08/20 0318  WBC 13.8* 11.0* 9.5 7.5 7.3  NEUTROABS 12.8*  --   --   --  5.0  HGB 8.9* 8.3* 7.1* 6.1* 7.9*  HCT 28.8* 26.9* 23.4* 19.6* 24.8*  MCV 82.1 81.0 83.0 81.7 82.1  PLT 342 341 273 254 236   Cardiac Enzymes: No results for input(s): CKTOTAL, CKMB, CKMBINDEX, TROPONINI in the last 168 hours. BNP: BNP (last 3 results) No results for input(s): BNP in the last 8760 hours.  ProBNP (last 3 results) No results for input(s): PROBNP in the last 8760 hours.  CBG: No results for input(s): GLUCAP in the last 168 hours.     Signed:  Cristal Deer, MD [facility] 12/08/2020, 2:16 PM

## 2020-12-08 NOTE — Progress Notes (Signed)
     Subjective: 2 Days Post-Op Procedure(s) (LRB): TOTAL HIP ARTHROPLASTY ANTERIOR APPROACH (Right)   Patient reports pain as mild, describes the pain more as an ache.  Has already been up and walking around this morning.  No reported events throughout the night.  Patient feels that she is doing well.  We have discussed expectations moving forward.    Objective:   VITALS:   Vitals:   12/08/20 0540 12/08/20 0948  BP: 106/65 (!) 98/44  Pulse: 74 62  Resp: 15 16  Temp: 98 F (36.7 C) 98.3 F (36.8 C)  SpO2: 98% 97%    Dorsiflexion/Plantar flexion intact Incision: dressing C/D/I No cellulitis present Compartment soft  LABS Recent Labs    12/07/20 0314 12/07/20 1917 12/08/20 0318  HGB 7.1* 6.1* 7.9*  HCT 23.4* 19.6* 24.8*  WBC 9.5 7.5 7.3  PLT 273 254 236    Recent Labs    12/06/20 0312 12/07/20 0314 12/08/20 0318  NA 136 130* 133*  K 3.6 3.5 3.8  BUN 16 17 22   CREATININE 0.66 0.65 0.80  GLUCOSE 112* 155* 107*     Assessment/Plan: 2 Days Post-Op Procedure(s) (LRB): TOTAL HIP ARTHROPLASTY ANTERIOR APPROACH (Right)   Up with therapy Discharge disposition to be determined        Danae Orleans PA-C  [facility]  [facility] [address], [address], [place], [place] [address] Phone: [phone] [email or web address] Facebook  [employer]

## 2020-12-08 NOTE — TOC Progression Note (Signed)
Transition of Care [facility]) - Progression Note    Patient Details  Name: Cheryl Maldonado MRN: [id number] Date of Birth: Feb 03, 1933  [facility]) CM/SW Contact  Joaquin Courts, RN Phone Number: 12/08/2020, 12:02 PM  Clinical Narrative:    CM received confirmation from patient's son that patient has 3in1 at home.   Expected Discharge Plan: [place] Barriers to Discharge: Continued Medical Work up  Expected Discharge Plan and Services Expected Discharge Plan: [place]   Discharge Planning Services: CM Consult Post Acute Care Choice: [place] arrangements for the past 2 months: Single Family Home Expected Discharge Date: 12/08/20                         HH Arranged: PT HH Agency: [facility] Date [place]: 12/07/20 Time [place]: [id number] Representative spoke with at [place]: [place] ([place]) Interventions    Readmission Risk Interventions No flowsheet data found.

## 2020-12-10 ENCOUNTER — Encounter (HOSPITAL_COMMUNITY): Payer: Self-pay | Admitting: Orthopedic Surgery

## 2020-12-10 DIAGNOSIS — Z96641 Presence of right artificial hip joint: Secondary | ICD-10-CM | POA: Diagnosis not present

## 2020-12-10 DIAGNOSIS — Z9181 History of falling: Secondary | ICD-10-CM | POA: Diagnosis not present

## 2020-12-10 DIAGNOSIS — S72001D Fracture of unspecified part of neck of right femur, subsequent encounter for closed fracture with routine healing: Secondary | ICD-10-CM | POA: Diagnosis not present

## 2020-12-10 DIAGNOSIS — Z7982 Long term (current) use of aspirin: Secondary | ICD-10-CM | POA: Diagnosis not present

## 2020-12-10 DIAGNOSIS — I10 Essential (primary) hypertension: Secondary | ICD-10-CM | POA: Diagnosis not present

## 2020-12-10 DIAGNOSIS — Z79891 Long term (current) use of opiate analgesic: Secondary | ICD-10-CM | POA: Diagnosis not present

## 2020-12-10 DIAGNOSIS — E785 Hyperlipidemia, unspecified: Secondary | ICD-10-CM | POA: Diagnosis not present

## 2020-12-10 DIAGNOSIS — I890 Lymphedema, not elsewhere classified: Secondary | ICD-10-CM | POA: Diagnosis not present

## 2020-12-10 DIAGNOSIS — C50919 Malignant neoplasm of unspecified site of unspecified female breast: Secondary | ICD-10-CM | POA: Diagnosis not present

## 2020-12-11 ENCOUNTER — Other Ambulatory Visit: Payer: Self-pay

## 2020-12-11 NOTE — Patient Outreach (Signed)
[facility]) Care Management  12/11/2020  Maxene Byington 07/25/1933 [id number]   EMMI- General Discharge RED ON EMMI ALERT Day # 1 Date: 12/10/20 Red Alert Reason:  Read discharge papers? No   Scheduled follow-up? No    Outreach attempt: Telephone call to patient.  She reports doing well since being home. Patient reports she has paid caregivers who assist her and that her son is also supportive and involved with her care.  Addressed red alerts with patient. She states her son got her discharge papers and that she has everything she needs presently. Patient reports having a follow up appointment in Tuesday with her surgeon and no issues with transportation.  Discussed [facility] services and support. Patient declined at this time as she has paid caregivers and home healht being involved as well.     Plan: RN CM will close case.     Jone Baseman, RN, MSN [facility] Care Management Care Management Coordinator Direct Line [phone] Toll Free: [phone]  Fax: [phone]

## 2020-12-13 DIAGNOSIS — I890 Lymphedema, not elsewhere classified: Secondary | ICD-10-CM | POA: Diagnosis not present

## 2020-12-13 DIAGNOSIS — I10 Essential (primary) hypertension: Secondary | ICD-10-CM | POA: Diagnosis not present

## 2020-12-13 DIAGNOSIS — Z96641 Presence of right artificial hip joint: Secondary | ICD-10-CM | POA: Diagnosis not present

## 2020-12-13 DIAGNOSIS — S72001D Fracture of unspecified part of neck of right femur, subsequent encounter for closed fracture with routine healing: Secondary | ICD-10-CM | POA: Diagnosis not present

## 2020-12-13 DIAGNOSIS — C50919 Malignant neoplasm of unspecified site of unspecified female breast: Secondary | ICD-10-CM | POA: Diagnosis not present

## 2020-12-13 DIAGNOSIS — E785 Hyperlipidemia, unspecified: Secondary | ICD-10-CM | POA: Diagnosis not present

## 2020-12-13 DIAGNOSIS — Z7982 Long term (current) use of aspirin: Secondary | ICD-10-CM | POA: Diagnosis not present

## 2020-12-13 DIAGNOSIS — Z9181 History of falling: Secondary | ICD-10-CM | POA: Diagnosis not present

## 2020-12-13 DIAGNOSIS — Z79891 Long term (current) use of opiate analgesic: Secondary | ICD-10-CM | POA: Diagnosis not present

## 2020-12-13 NOTE — Anesthesia Postprocedure Evaluation (Signed)
Anesthesia Post Note  Patient: Cheryl Maldonado  Procedure(s) Performed: TOTAL HIP ARTHROPLASTY ANTERIOR APPROACH (Right Hip)     Patient location during evaluation: PACU Anesthesia Type: General Level of consciousness: awake and sedated Pain management: pain level controlled Vital Signs Assessment: post-procedure vital signs reviewed and stable Respiratory status: spontaneous breathing Cardiovascular status: stable Postop Assessment: no apparent nausea or vomiting Anesthetic complications: no   No complications documented.  Last Vitals:  Vitals:   12/08/20 0948 12/08/20 1336  BP: (!) 98/44 (!) 110/43  Pulse: 62 61  Resp: 16 16  Temp: 36.8 C 37 C  SpO2: 97% 98%    Last Pain:  Vitals:   12/08/20 1438  TempSrc:   PainSc: 0-No pain                 Huston Foley

## 2020-12-17 DIAGNOSIS — I10 Essential (primary) hypertension: Secondary | ICD-10-CM | POA: Diagnosis not present

## 2020-12-17 DIAGNOSIS — S72001D Fracture of unspecified part of neck of right femur, subsequent encounter for closed fracture with routine healing: Secondary | ICD-10-CM | POA: Diagnosis not present

## 2020-12-17 DIAGNOSIS — Z9181 History of falling: Secondary | ICD-10-CM | POA: Diagnosis not present

## 2020-12-17 DIAGNOSIS — I890 Lymphedema, not elsewhere classified: Secondary | ICD-10-CM | POA: Diagnosis not present

## 2020-12-17 DIAGNOSIS — Z96641 Presence of right artificial hip joint: Secondary | ICD-10-CM | POA: Diagnosis not present

## 2020-12-17 DIAGNOSIS — Z7982 Long term (current) use of aspirin: Secondary | ICD-10-CM | POA: Diagnosis not present

## 2020-12-17 DIAGNOSIS — Z79891 Long term (current) use of opiate analgesic: Secondary | ICD-10-CM | POA: Diagnosis not present

## 2020-12-17 DIAGNOSIS — C50919 Malignant neoplasm of unspecified site of unspecified female breast: Secondary | ICD-10-CM | POA: Diagnosis not present

## 2020-12-17 DIAGNOSIS — E785 Hyperlipidemia, unspecified: Secondary | ICD-10-CM | POA: Diagnosis not present

## 2020-12-20 DIAGNOSIS — C50919 Malignant neoplasm of unspecified site of unspecified female breast: Secondary | ICD-10-CM | POA: Diagnosis not present

## 2020-12-20 DIAGNOSIS — I890 Lymphedema, not elsewhere classified: Secondary | ICD-10-CM | POA: Diagnosis not present

## 2020-12-20 DIAGNOSIS — Z79891 Long term (current) use of opiate analgesic: Secondary | ICD-10-CM | POA: Diagnosis not present

## 2020-12-20 DIAGNOSIS — Z7982 Long term (current) use of aspirin: Secondary | ICD-10-CM | POA: Diagnosis not present

## 2020-12-20 DIAGNOSIS — S72001D Fracture of unspecified part of neck of right femur, subsequent encounter for closed fracture with routine healing: Secondary | ICD-10-CM | POA: Diagnosis not present

## 2020-12-20 DIAGNOSIS — Z9181 History of falling: Secondary | ICD-10-CM | POA: Diagnosis not present

## 2020-12-20 DIAGNOSIS — E785 Hyperlipidemia, unspecified: Secondary | ICD-10-CM | POA: Diagnosis not present

## 2020-12-20 DIAGNOSIS — Z96641 Presence of right artificial hip joint: Secondary | ICD-10-CM | POA: Diagnosis not present

## 2020-12-20 DIAGNOSIS — I10 Essential (primary) hypertension: Secondary | ICD-10-CM | POA: Diagnosis not present

## 2020-12-23 DIAGNOSIS — S72031D Displaced midcervical fracture of right femur, subsequent encounter for closed fracture with routine healing: Secondary | ICD-10-CM | POA: Diagnosis not present

## 2020-12-23 DIAGNOSIS — I10 Essential (primary) hypertension: Secondary | ICD-10-CM | POA: Diagnosis not present

## 2020-12-23 DIAGNOSIS — S72001D Fracture of unspecified part of neck of right femur, subsequent encounter for closed fracture with routine healing: Secondary | ICD-10-CM | POA: Diagnosis not present

## 2020-12-23 DIAGNOSIS — E785 Hyperlipidemia, unspecified: Secondary | ICD-10-CM | POA: Diagnosis not present

## 2020-12-23 DIAGNOSIS — Z96641 Presence of right artificial hip joint: Secondary | ICD-10-CM | POA: Diagnosis not present

## 2020-12-23 DIAGNOSIS — C50919 Malignant neoplasm of unspecified site of unspecified female breast: Secondary | ICD-10-CM | POA: Diagnosis not present

## 2020-12-23 DIAGNOSIS — Z9181 History of falling: Secondary | ICD-10-CM | POA: Diagnosis not present

## 2020-12-23 DIAGNOSIS — Z7982 Long term (current) use of aspirin: Secondary | ICD-10-CM | POA: Diagnosis not present

## 2020-12-23 DIAGNOSIS — Z79891 Long term (current) use of opiate analgesic: Secondary | ICD-10-CM | POA: Diagnosis not present

## 2020-12-23 DIAGNOSIS — I890 Lymphedema, not elsewhere classified: Secondary | ICD-10-CM | POA: Diagnosis not present

## 2020-12-26 DIAGNOSIS — I890 Lymphedema, not elsewhere classified: Secondary | ICD-10-CM | POA: Diagnosis not present

## 2020-12-26 DIAGNOSIS — E785 Hyperlipidemia, unspecified: Secondary | ICD-10-CM | POA: Diagnosis not present

## 2020-12-26 DIAGNOSIS — Z96641 Presence of right artificial hip joint: Secondary | ICD-10-CM | POA: Diagnosis not present

## 2020-12-26 DIAGNOSIS — Z79891 Long term (current) use of opiate analgesic: Secondary | ICD-10-CM | POA: Diagnosis not present

## 2020-12-26 DIAGNOSIS — Z7982 Long term (current) use of aspirin: Secondary | ICD-10-CM | POA: Diagnosis not present

## 2020-12-26 DIAGNOSIS — Z9181 History of falling: Secondary | ICD-10-CM | POA: Diagnosis not present

## 2020-12-26 DIAGNOSIS — I10 Essential (primary) hypertension: Secondary | ICD-10-CM | POA: Diagnosis not present

## 2020-12-26 DIAGNOSIS — S72001D Fracture of unspecified part of neck of right femur, subsequent encounter for closed fracture with routine healing: Secondary | ICD-10-CM | POA: Diagnosis not present

## 2020-12-26 DIAGNOSIS — C50919 Malignant neoplasm of unspecified site of unspecified female breast: Secondary | ICD-10-CM | POA: Diagnosis not present

## 2020-12-31 DIAGNOSIS — Z9181 History of falling: Secondary | ICD-10-CM | POA: Diagnosis not present

## 2020-12-31 DIAGNOSIS — E785 Hyperlipidemia, unspecified: Secondary | ICD-10-CM | POA: Diagnosis not present

## 2020-12-31 DIAGNOSIS — I10 Essential (primary) hypertension: Secondary | ICD-10-CM | POA: Diagnosis not present

## 2020-12-31 DIAGNOSIS — Z7982 Long term (current) use of aspirin: Secondary | ICD-10-CM | POA: Diagnosis not present

## 2020-12-31 DIAGNOSIS — I890 Lymphedema, not elsewhere classified: Secondary | ICD-10-CM | POA: Diagnosis not present

## 2020-12-31 DIAGNOSIS — Z96641 Presence of right artificial hip joint: Secondary | ICD-10-CM | POA: Diagnosis not present

## 2020-12-31 DIAGNOSIS — Z79891 Long term (current) use of opiate analgesic: Secondary | ICD-10-CM | POA: Diagnosis not present

## 2020-12-31 DIAGNOSIS — S72001D Fracture of unspecified part of neck of right femur, subsequent encounter for closed fracture with routine healing: Secondary | ICD-10-CM | POA: Diagnosis not present

## 2020-12-31 DIAGNOSIS — C50919 Malignant neoplasm of unspecified site of unspecified female breast: Secondary | ICD-10-CM | POA: Diagnosis not present

## 2021-01-06 DIAGNOSIS — I890 Lymphedema, not elsewhere classified: Secondary | ICD-10-CM | POA: Diagnosis not present

## 2021-01-06 DIAGNOSIS — S72001D Fracture of unspecified part of neck of right femur, subsequent encounter for closed fracture with routine healing: Secondary | ICD-10-CM | POA: Diagnosis not present

## 2021-01-06 DIAGNOSIS — Z9181 History of falling: Secondary | ICD-10-CM | POA: Diagnosis not present

## 2021-01-06 DIAGNOSIS — Z7982 Long term (current) use of aspirin: Secondary | ICD-10-CM | POA: Diagnosis not present

## 2021-01-06 DIAGNOSIS — E785 Hyperlipidemia, unspecified: Secondary | ICD-10-CM | POA: Diagnosis not present

## 2021-01-06 DIAGNOSIS — Z79891 Long term (current) use of opiate analgesic: Secondary | ICD-10-CM | POA: Diagnosis not present

## 2021-01-06 DIAGNOSIS — C50919 Malignant neoplasm of unspecified site of unspecified female breast: Secondary | ICD-10-CM | POA: Diagnosis not present

## 2021-01-06 DIAGNOSIS — Z96641 Presence of right artificial hip joint: Secondary | ICD-10-CM | POA: Diagnosis not present

## 2021-01-06 DIAGNOSIS — I10 Essential (primary) hypertension: Secondary | ICD-10-CM | POA: Diagnosis not present

## 2021-01-08 DIAGNOSIS — E785 Hyperlipidemia, unspecified: Secondary | ICD-10-CM | POA: Diagnosis not present

## 2021-01-08 DIAGNOSIS — I10 Essential (primary) hypertension: Secondary | ICD-10-CM | POA: Diagnosis not present

## 2021-01-08 DIAGNOSIS — C50919 Malignant neoplasm of unspecified site of unspecified female breast: Secondary | ICD-10-CM | POA: Diagnosis not present

## 2021-01-08 DIAGNOSIS — Z96641 Presence of right artificial hip joint: Secondary | ICD-10-CM | POA: Diagnosis not present

## 2021-01-08 DIAGNOSIS — Z79891 Long term (current) use of opiate analgesic: Secondary | ICD-10-CM | POA: Diagnosis not present

## 2021-01-08 DIAGNOSIS — Z7982 Long term (current) use of aspirin: Secondary | ICD-10-CM | POA: Diagnosis not present

## 2021-01-08 DIAGNOSIS — I890 Lymphedema, not elsewhere classified: Secondary | ICD-10-CM | POA: Diagnosis not present

## 2021-01-08 DIAGNOSIS — Z9181 History of falling: Secondary | ICD-10-CM | POA: Diagnosis not present

## 2021-01-08 DIAGNOSIS — S72001D Fracture of unspecified part of neck of right femur, subsequent encounter for closed fracture with routine healing: Secondary | ICD-10-CM | POA: Diagnosis not present

## 2021-01-13 DIAGNOSIS — Z79891 Long term (current) use of opiate analgesic: Secondary | ICD-10-CM | POA: Diagnosis not present

## 2021-01-13 DIAGNOSIS — Z9181 History of falling: Secondary | ICD-10-CM | POA: Diagnosis not present

## 2021-01-13 DIAGNOSIS — E785 Hyperlipidemia, unspecified: Secondary | ICD-10-CM | POA: Diagnosis not present

## 2021-01-13 DIAGNOSIS — I10 Essential (primary) hypertension: Secondary | ICD-10-CM | POA: Diagnosis not present

## 2021-01-13 DIAGNOSIS — Z7982 Long term (current) use of aspirin: Secondary | ICD-10-CM | POA: Diagnosis not present

## 2021-01-13 DIAGNOSIS — Z96641 Presence of right artificial hip joint: Secondary | ICD-10-CM | POA: Diagnosis not present

## 2021-01-13 DIAGNOSIS — I890 Lymphedema, not elsewhere classified: Secondary | ICD-10-CM | POA: Diagnosis not present

## 2021-01-13 DIAGNOSIS — C50919 Malignant neoplasm of unspecified site of unspecified female breast: Secondary | ICD-10-CM | POA: Diagnosis not present

## 2021-01-13 DIAGNOSIS — S72001D Fracture of unspecified part of neck of right femur, subsequent encounter for closed fracture with routine healing: Secondary | ICD-10-CM | POA: Diagnosis not present

## 2021-01-16 DIAGNOSIS — I890 Lymphedema, not elsewhere classified: Secondary | ICD-10-CM | POA: Diagnosis not present

## 2021-01-16 DIAGNOSIS — C50919 Malignant neoplasm of unspecified site of unspecified female breast: Secondary | ICD-10-CM | POA: Diagnosis not present

## 2021-01-16 DIAGNOSIS — Z9181 History of falling: Secondary | ICD-10-CM | POA: Diagnosis not present

## 2021-01-16 DIAGNOSIS — Z79891 Long term (current) use of opiate analgesic: Secondary | ICD-10-CM | POA: Diagnosis not present

## 2021-01-16 DIAGNOSIS — E785 Hyperlipidemia, unspecified: Secondary | ICD-10-CM | POA: Diagnosis not present

## 2021-01-16 DIAGNOSIS — S72001D Fracture of unspecified part of neck of right femur, subsequent encounter for closed fracture with routine healing: Secondary | ICD-10-CM | POA: Diagnosis not present

## 2021-01-16 DIAGNOSIS — Z96641 Presence of right artificial hip joint: Secondary | ICD-10-CM | POA: Diagnosis not present

## 2021-01-16 DIAGNOSIS — I10 Essential (primary) hypertension: Secondary | ICD-10-CM | POA: Diagnosis not present

## 2021-01-16 DIAGNOSIS — Z7982 Long term (current) use of aspirin: Secondary | ICD-10-CM | POA: Diagnosis not present

## 2021-01-20 DIAGNOSIS — S72031D Displaced midcervical fracture of right femur, subsequent encounter for closed fracture with routine healing: Secondary | ICD-10-CM | POA: Diagnosis not present

## 2021-01-21 DIAGNOSIS — E785 Hyperlipidemia, unspecified: Secondary | ICD-10-CM | POA: Diagnosis not present

## 2021-01-21 DIAGNOSIS — S72001D Fracture of unspecified part of neck of right femur, subsequent encounter for closed fracture with routine healing: Secondary | ICD-10-CM | POA: Diagnosis not present

## 2021-01-21 DIAGNOSIS — Z96641 Presence of right artificial hip joint: Secondary | ICD-10-CM | POA: Diagnosis not present

## 2021-01-21 DIAGNOSIS — Z9181 History of falling: Secondary | ICD-10-CM | POA: Diagnosis not present

## 2021-01-21 DIAGNOSIS — Z7982 Long term (current) use of aspirin: Secondary | ICD-10-CM | POA: Diagnosis not present

## 2021-01-21 DIAGNOSIS — I10 Essential (primary) hypertension: Secondary | ICD-10-CM | POA: Diagnosis not present

## 2021-01-21 DIAGNOSIS — I890 Lymphedema, not elsewhere classified: Secondary | ICD-10-CM | POA: Diagnosis not present

## 2021-01-21 DIAGNOSIS — Z79891 Long term (current) use of opiate analgesic: Secondary | ICD-10-CM | POA: Diagnosis not present

## 2021-01-21 DIAGNOSIS — C50919 Malignant neoplasm of unspecified site of unspecified female breast: Secondary | ICD-10-CM | POA: Diagnosis not present

## 2021-03-26 DIAGNOSIS — H1789 Other corneal scars and opacities: Secondary | ICD-10-CM | POA: Diagnosis not present

## 2021-03-26 DIAGNOSIS — H5203 Hypermetropia, bilateral: Secondary | ICD-10-CM | POA: Diagnosis not present

## 2021-03-26 DIAGNOSIS — Z961 Presence of intraocular lens: Secondary | ICD-10-CM | POA: Diagnosis not present

## 2021-06-03 DIAGNOSIS — S72142D Displaced intertrochanteric fracture of left femur, subsequent encounter for closed fracture with routine healing: Secondary | ICD-10-CM | POA: Diagnosis not present

## 2021-06-05 DIAGNOSIS — Z0000 Encounter for general adult medical examination without abnormal findings: Secondary | ICD-10-CM | POA: Diagnosis not present

## 2021-06-12 DIAGNOSIS — D649 Anemia, unspecified: Secondary | ICD-10-CM | POA: Diagnosis not present

## 2021-06-12 DIAGNOSIS — E871 Hypo-osmolality and hyponatremia: Secondary | ICD-10-CM | POA: Diagnosis not present

## 2021-06-12 DIAGNOSIS — Z0000 Encounter for general adult medical examination without abnormal findings: Secondary | ICD-10-CM | POA: Diagnosis not present

## 2021-06-12 DIAGNOSIS — M810 Age-related osteoporosis without current pathological fracture: Secondary | ICD-10-CM | POA: Diagnosis not present

## 2021-06-12 DIAGNOSIS — R195 Other fecal abnormalities: Secondary | ICD-10-CM | POA: Diagnosis not present

## 2021-06-12 DIAGNOSIS — I10 Essential (primary) hypertension: Secondary | ICD-10-CM | POA: Diagnosis not present

## 2021-06-12 DIAGNOSIS — E785 Hyperlipidemia, unspecified: Secondary | ICD-10-CM | POA: Diagnosis not present

## 2021-06-12 DIAGNOSIS — E673 Hypervitaminosis D: Secondary | ICD-10-CM | POA: Diagnosis not present

## 2021-06-12 DIAGNOSIS — E039 Hypothyroidism, unspecified: Secondary | ICD-10-CM | POA: Diagnosis not present

## 2021-07-02 DIAGNOSIS — M810 Age-related osteoporosis without current pathological fracture: Secondary | ICD-10-CM | POA: Diagnosis not present

## 2021-07-10 DIAGNOSIS — E673 Hypervitaminosis D: Secondary | ICD-10-CM | POA: Diagnosis not present

## 2021-07-10 DIAGNOSIS — D649 Anemia, unspecified: Secondary | ICD-10-CM | POA: Diagnosis not present

## 2021-07-10 DIAGNOSIS — E871 Hypo-osmolality and hyponatremia: Secondary | ICD-10-CM | POA: Diagnosis not present

## 2021-07-10 DIAGNOSIS — E039 Hypothyroidism, unspecified: Secondary | ICD-10-CM | POA: Diagnosis not present

## 2021-07-16 DIAGNOSIS — E673 Hypervitaminosis D: Secondary | ICD-10-CM | POA: Diagnosis not present

## 2021-07-16 DIAGNOSIS — R195 Other fecal abnormalities: Secondary | ICD-10-CM | POA: Diagnosis not present

## 2021-07-16 DIAGNOSIS — D509 Iron deficiency anemia, unspecified: Secondary | ICD-10-CM | POA: Diagnosis not present

## 2021-07-16 DIAGNOSIS — E871 Hypo-osmolality and hyponatremia: Secondary | ICD-10-CM | POA: Diagnosis not present

## 2021-07-31 DIAGNOSIS — R195 Other fecal abnormalities: Secondary | ICD-10-CM | POA: Diagnosis not present

## 2021-09-09 DIAGNOSIS — D509 Iron deficiency anemia, unspecified: Secondary | ICD-10-CM | POA: Diagnosis not present

## 2021-09-09 DIAGNOSIS — E673 Hypervitaminosis D: Secondary | ICD-10-CM | POA: Diagnosis not present

## 2021-09-17 DIAGNOSIS — I10 Essential (primary) hypertension: Secondary | ICD-10-CM | POA: Diagnosis not present

## 2021-09-17 DIAGNOSIS — E785 Hyperlipidemia, unspecified: Secondary | ICD-10-CM | POA: Diagnosis not present

## 2021-09-17 DIAGNOSIS — D509 Iron deficiency anemia, unspecified: Secondary | ICD-10-CM | POA: Diagnosis not present

## 2021-09-17 DIAGNOSIS — E673 Hypervitaminosis D: Secondary | ICD-10-CM | POA: Diagnosis not present

## 2021-09-17 DIAGNOSIS — E039 Hypothyroidism, unspecified: Secondary | ICD-10-CM | POA: Diagnosis not present

## 2021-12-05 ENCOUNTER — Encounter: Payer: Self-pay | Admitting: Internal Medicine

## 2021-12-08 DIAGNOSIS — S72031D Displaced midcervical fracture of right femur, subsequent encounter for closed fracture with routine healing: Secondary | ICD-10-CM | POA: Diagnosis not present

## 2021-12-18 DIAGNOSIS — D509 Iron deficiency anemia, unspecified: Secondary | ICD-10-CM | POA: Diagnosis not present

## 2021-12-18 DIAGNOSIS — E673 Hypervitaminosis D: Secondary | ICD-10-CM | POA: Diagnosis not present

## 2021-12-18 DIAGNOSIS — E039 Hypothyroidism, unspecified: Secondary | ICD-10-CM | POA: Diagnosis not present

## 2021-12-25 ENCOUNTER — Telehealth: Payer: Self-pay | Admitting: Oncology

## 2021-12-25 DIAGNOSIS — D509 Iron deficiency anemia, unspecified: Secondary | ICD-10-CM | POA: Diagnosis not present

## 2021-12-25 DIAGNOSIS — E673 Hypervitaminosis D: Secondary | ICD-10-CM | POA: Diagnosis not present

## 2021-12-25 NOTE — Telephone Encounter (Signed)
Scheduled appt per 2/23 referral. Pt is aware of appt date and time. Pt is aware to arrive 15 mins prior to appt time and to bring and updated insurance card. Pt is aware of appt location.   °

## 2021-12-29 ENCOUNTER — Observation Stay (HOSPITAL_COMMUNITY)
Admission: EM | Admit: 2021-12-29 | Discharge: 2021-12-30 | Disposition: A | Discharge status: Home Health | Payer: Medicare PPO | Attending: Internal Medicine | Admitting: Internal Medicine

## 2021-12-29 ENCOUNTER — Encounter (HOSPITAL_COMMUNITY): Payer: Self-pay

## 2021-12-29 ENCOUNTER — Other Ambulatory Visit: Payer: Self-pay

## 2021-12-29 DIAGNOSIS — Z96641 Presence of right artificial hip joint: Secondary | ICD-10-CM | POA: Insufficient documentation

## 2021-12-29 DIAGNOSIS — Z8542 Personal history of malignant neoplasm of other parts of uterus: Secondary | ICD-10-CM | POA: Diagnosis not present

## 2021-12-29 DIAGNOSIS — R531 Weakness: Secondary | ICD-10-CM | POA: Diagnosis not present

## 2021-12-29 DIAGNOSIS — M810 Age-related osteoporosis without current pathological fracture: Secondary | ICD-10-CM | POA: Insufficient documentation

## 2021-12-29 DIAGNOSIS — E039 Hypothyroidism, unspecified: Secondary | ICD-10-CM | POA: Diagnosis present

## 2021-12-29 DIAGNOSIS — K635 Polyp of colon: Secondary | ICD-10-CM | POA: Diagnosis not present

## 2021-12-29 DIAGNOSIS — R269 Unspecified abnormalities of gait and mobility: Secondary | ICD-10-CM | POA: Diagnosis not present

## 2021-12-29 DIAGNOSIS — D122 Benign neoplasm of ascending colon: Secondary | ICD-10-CM | POA: Diagnosis not present

## 2021-12-29 DIAGNOSIS — K259 Gastric ulcer, unspecified as acute or chronic, without hemorrhage or perforation: Secondary | ICD-10-CM | POA: Insufficient documentation

## 2021-12-29 DIAGNOSIS — K648 Other hemorrhoids: Secondary | ICD-10-CM | POA: Diagnosis not present

## 2021-12-29 DIAGNOSIS — K449 Diaphragmatic hernia without obstruction or gangrene: Secondary | ICD-10-CM | POA: Diagnosis not present

## 2021-12-29 DIAGNOSIS — D128 Benign neoplasm of rectum: Secondary | ICD-10-CM | POA: Insufficient documentation

## 2021-12-29 DIAGNOSIS — K921 Melena: Secondary | ICD-10-CM

## 2021-12-29 DIAGNOSIS — E673 Hypervitaminosis D: Secondary | ICD-10-CM

## 2021-12-29 DIAGNOSIS — Z853 Personal history of malignant neoplasm of breast: Secondary | ICD-10-CM | POA: Diagnosis not present

## 2021-12-29 DIAGNOSIS — R718 Other abnormality of red blood cells: Secondary | ICD-10-CM | POA: Diagnosis not present

## 2021-12-29 DIAGNOSIS — K3189 Other diseases of stomach and duodenum: Secondary | ICD-10-CM | POA: Diagnosis not present

## 2021-12-29 DIAGNOSIS — I10 Essential (primary) hypertension: Secondary | ICD-10-CM | POA: Diagnosis not present

## 2021-12-29 DIAGNOSIS — D509 Iron deficiency anemia, unspecified: Secondary | ICD-10-CM | POA: Insufficient documentation

## 2021-12-29 DIAGNOSIS — K209 Esophagitis, unspecified without bleeding: Secondary | ICD-10-CM | POA: Diagnosis not present

## 2021-12-29 DIAGNOSIS — I890 Lymphedema, not elsewhere classified: Secondary | ICD-10-CM

## 2021-12-29 DIAGNOSIS — K22719 Barrett's esophagus with dysplasia, unspecified: Secondary | ICD-10-CM | POA: Diagnosis not present

## 2021-12-29 DIAGNOSIS — Z8711 Personal history of peptic ulcer disease: Secondary | ICD-10-CM

## 2021-12-29 DIAGNOSIS — K208 Other esophagitis without bleeding: Secondary | ICD-10-CM

## 2021-12-29 DIAGNOSIS — N183 Chronic kidney disease, stage 3 unspecified: Secondary | ICD-10-CM | POA: Insufficient documentation

## 2021-12-29 DIAGNOSIS — R195 Other fecal abnormalities: Secondary | ICD-10-CM | POA: Diagnosis not present

## 2021-12-29 DIAGNOSIS — J302 Other seasonal allergic rhinitis: Secondary | ICD-10-CM | POA: Insufficient documentation

## 2021-12-29 DIAGNOSIS — Z79899 Other long term (current) drug therapy: Secondary | ICD-10-CM | POA: Insufficient documentation

## 2021-12-29 DIAGNOSIS — D500 Iron deficiency anemia secondary to blood loss (chronic): Secondary | ICD-10-CM | POA: Diagnosis present

## 2021-12-29 DIAGNOSIS — K621 Rectal polyp: Secondary | ICD-10-CM | POA: Diagnosis not present

## 2021-12-29 DIAGNOSIS — E785 Hyperlipidemia, unspecified: Secondary | ICD-10-CM

## 2021-12-29 DIAGNOSIS — K5730 Diverticulosis of large intestine without perforation or abscess without bleeding: Secondary | ICD-10-CM | POA: Diagnosis not present

## 2021-12-29 DIAGNOSIS — K922 Gastrointestinal hemorrhage, unspecified: Secondary | ICD-10-CM | POA: Diagnosis not present

## 2021-12-29 DIAGNOSIS — D649 Anemia, unspecified: Secondary | ICD-10-CM

## 2021-12-29 DIAGNOSIS — R2681 Unsteadiness on feet: Secondary | ICD-10-CM | POA: Insufficient documentation

## 2021-12-29 DIAGNOSIS — Z20822 Contact with and (suspected) exposure to covid-19: Secondary | ICD-10-CM | POA: Insufficient documentation

## 2021-12-29 HISTORY — DX: Hyperlipidemia, unspecified: E78.5

## 2021-12-29 HISTORY — DX: Hypothyroidism, unspecified: E03.9

## 2021-12-29 HISTORY — DX: Iron deficiency anemia secondary to blood loss (chronic): D50.0

## 2021-12-29 HISTORY — DX: Hypervitaminosis D: E67.3

## 2021-12-29 LAB — CBC WITH DIFFERENTIAL/PLATELET
Abs Immature Granulocytes: 0.04 10*3/uL (ref 0.00–0.07)
Basophils Absolute: 0.1 10*3/uL (ref 0.0–0.1)
Basophils Relative: 1 %
Eosinophils Absolute: 0 10*3/uL (ref 0.0–0.5)
Eosinophils Relative: 0 %
HCT: 22.2 % — ABNORMAL LOW (ref 36.0–46.0)
Hemoglobin: 6.8 g/dL — CL (ref 12.0–15.0)
Immature Granulocytes: 1 %
Lymphocytes Relative: 11 %
Lymphs Abs: 0.9 10*3/uL (ref 0.7–4.0)
MCH: 32.9 pg (ref 26.0–34.0)
MCHC: 30.6 g/dL (ref 30.0–36.0)
MCV: 107.2 fL — ABNORMAL HIGH (ref 80.0–100.0)
Monocytes Absolute: 0.7 10*3/uL (ref 0.1–1.0)
Monocytes Relative: 8 %
Neutro Abs: 7 10*3/uL (ref 1.7–7.7)
Neutrophils Relative %: 79 %
Platelets: 359 10*3/uL (ref 150–400)
RBC: 2.07 MIL/uL — ABNORMAL LOW (ref 3.87–5.11)
RDW: 15.4 % (ref 11.5–15.5)
WBC: 8.7 10*3/uL (ref 4.0–10.5)
nRBC: 0 % (ref 0.0–0.2)

## 2021-12-29 LAB — COMPREHENSIVE METABOLIC PANEL
ALT: 15 U/L (ref 0–44)
AST: 20 U/L (ref 15–41)
Albumin: 3.5 g/dL (ref 3.5–5.0)
Alkaline Phosphatase: 43 U/L (ref 38–126)
Anion gap: 4 — ABNORMAL LOW (ref 5–15)
BUN: 27 mg/dL — ABNORMAL HIGH (ref 8–23)
CO2: 23 mmol/L (ref 22–32)
Calcium: 8.6 mg/dL — ABNORMAL LOW (ref 8.9–10.3)
Chloride: 106 mmol/L (ref 98–111)
Creatinine, Ser: 0.66 mg/dL (ref 0.44–1.00)
GFR, Estimated: >60 mL/min (ref >60)
Glucose, Bld: 113 mg/dL — ABNORMAL HIGH (ref 70–99)
Potassium: 4.2 mmol/L (ref 3.5–5.1)
Sodium: 133 mmol/L — ABNORMAL LOW (ref 135–145)
Total Bilirubin: 0.2 mg/dL — ABNORMAL LOW (ref 0.3–1.2)
Total Protein: 5.6 g/dL — ABNORMAL LOW (ref 6.5–8.1)

## 2021-12-29 LAB — RETICULOCYTES
Immature Retic Fract: 41.2 % — ABNORMAL HIGH (ref 2.3–15.9)
RBC.: 2.08 MIL/uL — ABNORMAL LOW (ref 3.87–5.11)
Retic Count, Absolute: 188 10*3/uL — ABNORMAL HIGH (ref 19.0–186.0)
Retic Ct Pct: 9 % — ABNORMAL HIGH (ref 0.4–3.1)

## 2021-12-29 LAB — IRON AND TIBC
Iron: 18 ug/dL — ABNORMAL LOW (ref 28–170)
Saturation Ratios: 5 % — ABNORMAL LOW (ref 10.4–31.8)
TIBC: 344 ug/dL (ref 250–450)
UIBC: 326 ug/dL

## 2021-12-29 LAB — RESP PANEL BY RT-PCR (FLU A&B, COVID) ARPGX2
Influenza A by PCR: NEGATIVE
Influenza B by PCR: NEGATIVE
SARS Coronavirus 2 by RT PCR: NEGATIVE

## 2021-12-29 LAB — FOLATE: Folate: 39.5 ng/mL (ref >5.9)

## 2021-12-29 LAB — HEMOGLOBIN AND HEMATOCRIT, BLOOD
HCT: 27.5 % — ABNORMAL LOW (ref 36.0–46.0)
Hemoglobin: 9 g/dL — ABNORMAL LOW (ref 12.0–15.0)

## 2021-12-29 LAB — FERRITIN: Ferritin: 25 ng/mL (ref 11–307)

## 2021-12-29 LAB — PROTIME-INR
INR: 1 (ref 0.8–1.2)
Prothrombin Time: 12.8 seconds (ref 11.4–15.2)

## 2021-12-29 LAB — PREPARE RBC (CROSSMATCH)

## 2021-12-29 LAB — VITAMIN B12: Vitamin B-12: 486 pg/mL (ref 180–914)

## 2021-12-29 MED ORDER — SIMVASTATIN 10 MG PO TABS
5.0000 mg | ORAL_TABLET | ORAL | Status: DC
Start: 2021-12-30 — End: 2021-12-30
  Administered 2021-12-30: 5 mg via ORAL
  Filled 2021-12-29: qty 1

## 2021-12-29 MED ORDER — PEG-KCL-NACL-NASULF-NA ASC-C 100 G PO SOLR
0.5000 | Freq: Once | ORAL | Status: AC
  Administered 2021-12-29: 100 g via ORAL
  Filled 2021-12-29: qty 1

## 2021-12-29 MED ORDER — LACTATED RINGERS IV SOLN: INTRAVENOUS | Status: DC

## 2021-12-29 MED ORDER — PANTOPRAZOLE SODIUM 40 MG IV SOLR
40.0000 mg | Freq: Once | INTRAVENOUS | Status: AC
  Administered 2021-12-29: 40 mg via INTRAVENOUS
  Filled 2021-12-29: qty 10

## 2021-12-29 MED ORDER — PEG-KCL-NACL-NASULF-NA ASC-C 100 G PO SOLR: 1.0000 | Freq: Once | ORAL | Status: DC

## 2021-12-29 MED ORDER — ONDANSETRON HCL 4 MG PO TABS: 4.0000 mg | ORAL_TABLET | Freq: Four times a day (QID) | ORAL | Status: DC | PRN

## 2021-12-29 MED ORDER — RALOXIFENE HCL 60 MG PO TABS
60.0000 mg | ORAL_TABLET | Freq: Every day | ORAL | Status: DC
Start: 2021-12-29 — End: 2021-12-30
  Administered 2021-12-29 – 2021-12-30 (×2): 60 mg via ORAL
  Filled 2021-12-29 (×2): qty 1

## 2021-12-29 MED ORDER — ACETAMINOPHEN 325 MG PO TABS: 650.0000 mg | ORAL_TABLET | Freq: Four times a day (QID) | ORAL | Status: DC | PRN

## 2021-12-29 MED ORDER — ACETAMINOPHEN 650 MG RE SUPP
650.0000 mg | Freq: Four times a day (QID) | RECTAL | Status: DC | PRN
Start: 2021-12-29 — End: 2021-12-30

## 2021-12-29 MED ORDER — OXYMETAZOLINE HCL 0.05 % NA SOLN
1.0000 | Freq: Every day | NASAL | Status: DC | PRN
  Filled 2021-12-29: qty 15

## 2021-12-29 MED ORDER — PANTOPRAZOLE SODIUM 40 MG IV SOLR
40.0000 mg | Freq: Two times a day (BID) | INTRAVENOUS | Status: DC
  Administered 2021-12-29 – 2021-12-30 (×2): 40 mg via INTRAVENOUS
  Filled 2021-12-29: qty 10

## 2021-12-29 MED ORDER — SODIUM CHLORIDE 0.9% IV SOLUTION: Freq: Once | INTRAVENOUS | Status: AC

## 2021-12-29 MED ORDER — ONDANSETRON HCL 4 MG/2ML IJ SOLN: 4.0000 mg | Freq: Four times a day (QID) | INTRAMUSCULAR | Status: DC | PRN

## 2021-12-29 MED ORDER — LEVOTHYROXINE SODIUM 50 MCG PO TABS
75.0000 ug | ORAL_TABLET | Freq: Every day | ORAL | Status: DC
  Administered 2021-12-30: 75 ug via ORAL
  Filled 2021-12-29: qty 1

## 2021-12-29 MED ORDER — PEG-KCL-NACL-NASULF-NA ASC-C 100 G PO SOLR
0.5000 | Freq: Once | ORAL | Status: AC
  Administered 2021-12-29: 100 g via ORAL

## 2021-12-29 NOTE — ED Notes (Signed)
Critical Hemoglobin 6.8 Reported to [employer]

## 2021-12-29 NOTE — ED Triage Notes (Signed)
Pt sent by PCP for abnormal labs. Pt hemoglobin was 8.1 last week and today hemoglobin is 7.1. Patient is having dark tarry stools, generalized weakness, and some confusion.

## 2021-12-29 NOTE — ED Provider Notes (Signed)
[facility] DEPT Provider Note   CSN: [id number] Arrival date & time: 12/29/21  1214     History  No chief complaint on file.   Cheryl Maldonado is a 88 y.o. female.  HPI Patient sent in by PCP for blood transfusion for GI bleed and anemia.  Hemoglobin less than a week ago 8.1.  Now 7.1 on recheck.  Has had black stool.  Reportedly is on iron supplementation.  Has not had GI follow-up.  Guaiac positive at the office today.  Patient is feeling more lightheaded.  Also a little more confused.  Patient's caregiver provides much of the history.   Past Medical History:  Diagnosis Date   Breast cancer ([place])    Hypertension    Intertrochanteric fracture of left hip ([place]) 04/11/2019   Uterine cancer ([place])    Past Surgical History:  Procedure Laterality Date   ABDOMINAL HYSTERECTOMY     COLONOSCOPY     FEMUR IM NAIL Left 04/13/2019   Procedure: INTRAMEDULLARY (IM) NAIL FEMORAL;  Surgeon: Rod Can, MD;  Location: [facility];  Service: Orthopedics;  Laterality: Left;   TOTAL HIP ARTHROPLASTY Right 12/06/2020   Procedure: TOTAL HIP ARTHROPLASTY ANTERIOR APPROACH;  Surgeon: Rod Can, MD;  Location: [facility];  Service: Orthopedics;  Laterality: Right;     Home Medications Prior to Admission medications   Medication Sig Start Date End Date Taking? Authorizing Provider  acetaminophen (TYLENOL) 325 MG tablet Take 1-2 tablets (325-650 mg total) by mouth every 6 (six) hours as needed for mild pain (pain score 1-3 or temp > 100.5). 12/08/20   Cristal Deer, MD  amLODipine (NORVASC) 5 MG tablet Take 5 mg by mouth daily.    [provider]  Cholecalciferol (VITAMIN D) 125 MCG (5000 UT) CAPS Take 5,000 Units by mouth daily.    [provider]  docusate sodium (COLACE) 100 MG capsule Take 1 capsule (100 mg total) by mouth 2 (two) times daily. 12/08/20   Cristal Deer, MD  levothyroxine (SYNTHROID, LEVOTHROID) 75 MCG tablet Take 75 mcg by mouth  daily before breakfast.    [provider]  menthol-cetylpyridinium (CEPACOL) 3 MG lozenge Take 1 lozenge (3 mg total) by mouth as needed for sore throat. 12/08/20   Cristal Deer, MD  Multiple Vitamins-Minerals (MULTIVITAMIN ADULT) TABS Take 1 tablet by mouth daily.    [provider]  mupirocin ointment (BACTROBAN) 2 % Place 1 application into the nose 2 (two) times daily. 12/08/20   Cristal Deer, MD  oxymetazoline (AFRIN) 0.05 % nasal spray Place 1 spray into both nostrils daily as needed for congestion.    [provider]  raloxifene (EVISTA) 60 MG tablet Take 60 mg by mouth daily.    [provider]  simvastatin (ZOCOR) 5 MG tablet Take 5 mg by mouth every other day.    [provider]  telmisartan (MICARDIS) 80 MG tablet Take 80 mg by mouth daily. 10/30/20   [provider]  vitamin C (ASCORBIC ACID) 500 MG tablet Take 500 mg by mouth daily.    [provider]      Allergies    Patient has no known allergies.    Review of Systems   Review of Systems  Constitutional:  Positive for fatigue.  Cardiovascular:  Negative for chest pain.  Gastrointestinal:  Negative for abdominal pain.  Skin:  Negative for rash.  Neurological:  Positive for light-headedness.   Physical Exam Updated Vital Signs BP (!) 120/45  Pulse 73    Temp 97.8 F (36.6 C) (Oral)    Resp 18    Ht 4' 10.5" (1.486 m)    Wt 47.8 kg    SpO2 100%    BMI 21.65 kg/m  Physical Exam Vitals and nursing note reviewed.  Cardiovascular:     Rate and Rhythm: Regular rhythm.  Pulmonary:     Breath sounds: No wheezing or rhonchi.  Abdominal:     Tenderness: There is no abdominal tenderness.  Skin:    Capillary Refill: Capillary refill takes less than 2 seconds.     Coloration: Skin is pale.  Neurological:     Mental Status: She is alert.     Comments: Awake and pleasant but caregiver provides much history.    ED Results / Procedures / Treatments    Labs (all labs ordered are listed, but only abnormal results are displayed) Labs Reviewed  IRON AND TIBC - Abnormal; Notable for the following components:      Result Value   Iron 18 (*)    Saturation Ratios 5 (*)    All other components within normal limits  CBC WITH DIFFERENTIAL/PLATELET - Abnormal; Notable for the following components:   RBC 2.07 (*)    Hemoglobin 6.8 (*)    HCT 22.2 (*)    MCV 107.2 (*)    All other components within normal limits  COMPREHENSIVE METABOLIC PANEL - Abnormal; Notable for the following components:   Sodium 133 (*)    Glucose, Bld 113 (*)    BUN 27 (*)    Calcium 8.6 (*)    Total Protein 5.6 (*)    Total Bilirubin 0.2 (*)    Anion gap 4 (*)    All other components within normal limits  RESP PANEL BY RT-PCR (FLU A&B, COVID) ARPGX2  FERRITIN  PROTIME-INR  VITAMIN B12  FOLATE  RETICULOCYTES  TYPE AND SCREEN    EKG None  Radiology No results found.  Procedures Procedures    Medications Ordered in ED Medications  pantoprazole (PROTONIX) injection 40 mg (40 mg Intravenous Given 12/29/21 1310)    ED Course/ Medical Decision Making/ A&P                           Medical Decision Making Problems Addressed: Anemia, unspecified type: acute illness or injury Gastrointestinal hemorrhage, unspecified gastrointestinal hemorrhage type: acute illness or injury  Amount and/or Complexity of Data Reviewed External Data Reviewed: notes. Labs: ordered. Decision-making details documented in ED Course.  Risk Prescription drug management. Decision regarding hospitalization.   Patient presents with worsening anemia.  Seen by PCP and sent in.  I reviewed the lab work and notes sent with the patient.  Hemoglobin had gone from 8.1-7.1.  Hemoglobin here is 6.8.  He is symptomatic.  Has been lightheaded.  Patient is macrocytic here.  Patient is on iron supplementation but with the macrocytosis potentially could be B12 or folate deficiency.  These  levels have been added on.  Guaiac positive at the PCP.  We will likely require transfusion but hemodynamically okay at rest at this point.  Will discuss with hospitalist for admission.  CRITICAL CARE Performed by: Davonna Belling Total critical care time: 30 minutes Critical care time was exclusive of separately billable procedures and treating other patients. Critical care was necessary to treat or prevent imminent or life-threatening deterioration. Critical care was time spent personally by me on the following activities: development of treatment plan  with patient and/or surrogate as well as nursing, discussions with consultants, evaluation of patient's response to treatment, examination of patient, obtaining history from patient or surrogate, ordering and performing treatments and interventions, ordering and review of laboratory studies, ordering and review of radiographic studies, pulse oximetry and re-evaluation of patient's condition.           Final Clinical Impression(s) / ED Diagnoses Final diagnoses:  Anemia, unspecified type  Gastrointestinal hemorrhage, unspecified gastrointestinal hemorrhage type    Rx / DC Orders ED Discharge Orders     None         Davonna Belling, MD 12/29/21 1415

## 2021-12-29 NOTE — H&P (View-Only) (Signed)
Consultation  Referring Provider: Dr. Olevia Bowens      Primary Care Physician:  [facility], [place] Primary Gastroenterologist: Dr. Ardis Hughs       Reason for Consultation: Iron deficiency anemia            HPI:   Lemon Whitacre is a 88 y.o. female with a past medical history of breast cancer and uterine cancer and total hip arthroplasty 12/06/2020, who was sent to the hospital today from her PCP for blood transfusion due to iron deficiency anemia.    Per ER physicians notes patient's hemoglobin a week ago was 8.1 and now 7.1 on recheck and patient describes black stool.  Also reports being on iron supplementation.  Was guaiac positive in the office described feeling more lightheaded as well as confused.    Today, patient is seen with her caregiver by her side.  They explain that the patient was found to be anemic back in November and was put on oral iron at that time and her hemoglobin was around 11.8, she just went back last week to her PCP for recheck and had dropped down to 8.1 regardless of their oral iron supplementation with a Flintstones vitamin.  Apparently the patient describes some dark stool yesterday x2 to her caregiver and was brought into the ER.  They do describe some weakness upon standing over the past 24 to 48 hours.  Caregiver tells me things just declined "quickly".    Denies fever, chills, weight loss, bright red blood in her stool, abdominal pain, nausea, vomiting, heartburn or reflux.  ER work-up: Iron 18, percent saturation 5%, hemoglobin 6.8, BUN 27  GI history: 02/19/2016 office visit with Dr. Ardis Hughs: At that time recommended against any further colon cancer screenings or polyp surveillance 02/03/2010 colonoscopy with 1 pedunculated polyp in the sigmoid colon-pathology showed tubular adenoma  Past Medical History:  Diagnosis Date   Breast cancer ([place])    Hypertension    Intertrochanteric fracture of left hip ([place]) 04/11/2019   Uterine cancer ([place])     Past Surgical  History:  Procedure Laterality Date   ABDOMINAL HYSTERECTOMY     COLONOSCOPY     FEMUR IM NAIL Left 04/13/2019   Procedure: INTRAMEDULLARY (IM) NAIL FEMORAL;  Surgeon: Rod Can, MD;  Location: [facility];  Service: Orthopedics;  Laterality: Left;   TOTAL HIP ARTHROPLASTY Right 12/06/2020   Procedure: TOTAL HIP ARTHROPLASTY ANTERIOR APPROACH;  Surgeon: Rod Can, MD;  Location: [facility];  Service: Orthopedics;  Laterality: Right;    Family History  Problem Relation Age of Onset   Cancer Mother       Social History   Tobacco Use   Smoking status: Never   Smokeless tobacco: Never  Vaping Use   Vaping Use: Never used  Substance Use Topics   Alcohol use: No    Alcohol/week: 0.0 standard drinks   Drug use: Never    Prior to Admission medications   Medication Sig Start Date End Date Taking? Authorizing Provider  acetaminophen (TYLENOL) 325 MG tablet Take 1-2 tablets (325-650 mg total) by mouth every 6 (six) hours as needed for mild pain (pain score 1-3 or temp > 100.5). 12/08/20   Cristal Deer, MD  amLODipine (NORVASC) 5 MG tablet Take 5 mg by mouth daily.    [provider]  Cholecalciferol (VITAMIN D) 125 MCG (5000 UT) CAPS Take 5,000 Units by mouth daily.    [provider]  docusate sodium (COLACE) 100 MG capsule Take 1  capsule (100 mg total) by mouth 2 (two) times daily. 12/08/20   Cristal Deer, MD  levothyroxine (SYNTHROID, LEVOTHROID) 75 MCG tablet Take 75 mcg by mouth daily before breakfast.    [provider]  menthol-cetylpyridinium (CEPACOL) 3 MG lozenge Take 1 lozenge (3 mg total) by mouth as needed for sore throat. 12/08/20   Cristal Deer, MD  Multiple Vitamins-Minerals (MULTIVITAMIN ADULT) TABS Take 1 tablet by mouth daily.    [provider]  mupirocin ointment (BACTROBAN) 2 % Place 1 application into the nose 2 (two) times daily. 12/08/20   Cristal Deer, MD  oxymetazoline (AFRIN) 0.05 % nasal spray Place 1 spray into both  nostrils daily as needed for congestion.    [provider]  raloxifene (EVISTA) 60 MG tablet Take 60 mg by mouth daily.    [provider]  simvastatin (ZOCOR) 5 MG tablet Take 5 mg by mouth every other day.    [provider]  telmisartan (MICARDIS) 80 MG tablet Take 80 mg by mouth daily. 10/30/20   [provider]  vitamin C (ASCORBIC ACID) 500 MG tablet Take 500 mg by mouth daily.    [provider]    Current Facility-Administered Medications  Medication Dose Route Frequency Provider Last Rate Last Admin   acetaminophen (TYLENOL) tablet 650 mg  650 mg Oral Q6H PRN Reubin Milan, MD       Or   acetaminophen (TYLENOL) suppository 650 mg  650 mg Rectal Q6H PRN Reubin Milan, MD       lactated ringers infusion   Intravenous Continuous Reubin Milan, MD       ondansetron [facility]) tablet 4 mg  4 mg Oral Q6H PRN Reubin Milan, MD       Or   ondansetron [facility]) injection 4 mg  4 mg Intravenous Q6H PRN Reubin Milan, MD       pantoprazole (PROTONIX) injection 40 mg  40 mg Intravenous Q12H Reubin Milan, MD       Current Outpatient Medications  Medication Sig Dispense Refill   acetaminophen (TYLENOL) 325 MG tablet Take 1-2 tablets (325-650 mg total) by mouth every 6 (six) hours as needed for mild pain (pain score 1-3 or temp > 100.5). 60 tablet 0   amLODipine (NORVASC) 5 MG tablet Take 5 mg by mouth daily.     Cholecalciferol (VITAMIN D) 125 MCG (5000 UT) CAPS Take 5,000 Units by mouth daily.     docusate sodium (COLACE) 100 MG capsule Take 1 capsule (100 mg total) by mouth 2 (two) times daily. 10 capsule 0   levothyroxine (SYNTHROID, LEVOTHROID) 75 MCG tablet Take 75 mcg by mouth daily before breakfast.     menthol-cetylpyridinium (CEPACOL) 3 MG lozenge Take 1 lozenge (3 mg total) by mouth as needed for sore throat. 100 tablet 12   Multiple Vitamins-Minerals (MULTIVITAMIN ADULT) TABS Take 1 tablet by mouth daily.      mupirocin ointment (BACTROBAN) 2 % Place 1 application into the nose 2 (two) times daily. 22 g 0   oxymetazoline (AFRIN) 0.05 % nasal spray Place 1 spray into both nostrils daily as needed for congestion.     raloxifene (EVISTA) 60 MG tablet Take 60 mg by mouth daily.     simvastatin (ZOCOR) 5 MG tablet Take 5 mg by mouth every other day.     telmisartan (MICARDIS) 80 MG tablet Take 80 mg by mouth daily.     vitamin C (ASCORBIC ACID) 500 MG tablet  Take 500 mg by mouth daily.      Allergies as of 12/29/2021   (No Known Allergies)    Review of Systems:    Constitutional: No weight loss, fever or chills Skin: No rash  Cardiovascular: No chest pain  Respiratory: No SOB  Gastrointestinal: See HPI and otherwise negative Genitourinary: No dysuria  Neurological: No headache, dizziness or syncope Musculoskeletal: No new muscle or joint pain Hematologic: +melena Psychiatric: No history of depression or anxiety    Physical Exam:  Vital signs in last 24 hours: Temp:  [97.8 F (36.6 C)] 97.8 F (36.6 C) (02/27 1230) Pulse Rate:  [73-92] 73 (02/27 1330) Resp:  [18] 18 (02/27 1330) BP: (120-164)/(45-61) 120/45 (02/27 1330) SpO2:  [100 %] 100 % (02/27 1330) Weight:  [47.8 kg] 47.8 kg (02/27 1231)   General:   Pleasant frail appearing, elderly Caucasian female appears to be in NAD, Well developed, Well nourished, alert and cooperative Head:  Normocephalic and atraumatic. Eyes:   PEERL, EOMI. No icterus. Conjunctiva pink. Ears:  Normal auditory acuity. Neck:  Supple Throat: Oral cavity and pharynx without inflammation, swelling or lesion. Lungs: Respirations even and unlabored. Lungs clear to auscultation bilaterally.   No wheezes, crackles, or rhonchi.  Heart: Normal S1, S2. No MRG. Regular rate and rhythm.  Abdomen:  Soft, nondistended, nontender. No rebound or guarding. Normal bowel sounds. No appreciable masses or hepatomegaly. Rectal:  Not performed.  Msk:  Symmetrical without  gross deformities. Peripheral pulses intact.  Extremities:  Without edema, no deformity or joint abnormality.  Neurologic:  Alert and  oriented x4;  grossly normal neurologically.  Skin:   Dry and intact without significant lesions or rashes. Psychiatric: Demonstrates good judgement and reason without abnormal affect or behaviors.   LAB RESULTS: Recent Labs    12/29/21 1241  WBC 8.7  HGB 6.8*  HCT 22.2*  PLT 359   BMET Recent Labs    12/29/21 1241  NA 133*  K 4.2  CL 106  CO2 23  GLUCOSE 113*  BUN 27*  CREATININE 0.66  CALCIUM 8.6*   LFT Recent Labs    12/29/21 1241  PROT 5.6*  ALBUMIN 3.5  AST 20  ALT 15  ALKPHOS 43  BILITOT 0.2*   PT/INR Recent Labs    12/29/21 1241  LABPROT 12.8  INR 1.0     Impression / Plan:   Impression: 1.  Symptomatic iron deficiency anemia: Guiac + at PCP, Hemoglobin 6.8 at time of arrival, patient has been anemic for 4 months and on oral iron supplementation with a Flintstones vitamin, now describing some melena over the past 24 to 48 hours; consider upper GI versus lower GI source versus other  Plan: 1.  Plans for EGD and colonoscopy tomorrow with Dr. Lorenso Courier for further evaluation of symptomatic iron deficiency anemia.  Did discuss risks, benefits, limitations and alternatives and the patient agrees to proceed. 2.  Patient will need at least 1 unit of PRBCs overnight to ensure that her hemoglobin is greater than 7 in time for procedures tomorrow.  Appreciate assistance with this by hospitalist team. 3.  Patient will be on a clear liquid diet today and n.p.o. at midnight 4.  Patient be started on movi prep in split dose fashion at around 4:00 5.  Continue to monitor hemoglobin and transfusion as needed less than 7  Thank you for your kind consultation, we will continue to follow.  Lavone Nian Nasirah Sachs  12/29/2021, 2:50 PM

## 2021-12-29 NOTE — Consult Note (Signed)
Consultation  Referring Provider: Dr. Olevia Bowens      Primary Care Physician:  Holland Commons, [place] Primary Gastroenterologist: Dr. Ardis Hughs       Reason for Consultation: Iron deficiency anemia            HPI:   Cheryl Maldonado is a 88 y.o. female with a past medical history of breast cancer and uterine cancer and total hip arthroplasty 12/06/2020, who was sent to the hospital today from her PCP for blood transfusion due to iron deficiency anemia.    Per ER physicians notes patient's hemoglobin a week ago was 8.1 and now 7.1 on recheck and patient describes black stool.  Also reports being on iron supplementation.  Was guaiac positive in the office described feeling more lightheaded as well as confused.    Today, patient is seen with her caregiver by her side.  They explain that the patient was found to be anemic back in November and was put on oral iron at that time and her hemoglobin was around 11.8, she just went back last week to her PCP for recheck and had dropped down to 8.1 regardless of their oral iron supplementation with a Flintstones vitamin.  Apparently the patient describes some dark stool yesterday x2 to her caregiver and was brought into the ER.  They do describe some weakness upon standing over the past 24 to 48 hours.  Caregiver tells me things just declined "quickly".    Denies fever, chills, weight loss, bright red blood in her stool, abdominal pain, nausea, vomiting, heartburn or reflux.  ER work-up: Iron 18, percent saturation 5%, hemoglobin 6.8, BUN 27  GI history: 02/19/2016 office visit with Dr. Ardis Hughs: At that time recommended against any further colon cancer screenings or polyp surveillance 02/03/2010 colonoscopy with 1 pedunculated polyp in the sigmoid colon-pathology showed tubular adenoma  Past Medical History:  Diagnosis Date   Breast cancer ([place])    Hypertension    Intertrochanteric fracture of left hip ([place]) 04/11/2019   Uterine cancer ([place])     Past Surgical  History:  Procedure Laterality Date   ABDOMINAL HYSTERECTOMY     COLONOSCOPY     FEMUR IM NAIL Left 04/13/2019   Procedure: INTRAMEDULLARY (IM) NAIL FEMORAL;  Surgeon: Rod Can, MD;  Location: [facility];  Service: Orthopedics;  Laterality: Left;   TOTAL HIP ARTHROPLASTY Right 12/06/2020   Procedure: TOTAL HIP ARTHROPLASTY ANTERIOR APPROACH;  Surgeon: Rod Can, MD;  Location: [facility];  Service: Orthopedics;  Laterality: Right;    Family History  Problem Relation Age of Onset   Cancer Mother       Social History   Tobacco Use   Smoking status: Never   Smokeless tobacco: Never  Vaping Use   Vaping Use: Never used  Substance Use Topics   Alcohol use: No    Alcohol/week: 0.0 standard drinks   Drug use: Never    Prior to Admission medications   Medication Sig Start Date End Date Taking? Authorizing Provider  acetaminophen (TYLENOL) 325 MG tablet Take 1-2 tablets (325-650 mg total) by mouth every 6 (six) hours as needed for mild pain (pain score 1-3 or temp > 100.5). 12/08/20   Cristal Deer, MD  amLODipine (NORVASC) 5 MG tablet Take 5 mg by mouth daily.    [provider]  Cholecalciferol (VITAMIN D) 125 MCG (5000 UT) CAPS Take 5,000 Units by mouth daily.    [provider]  docusate sodium (COLACE) 100 MG capsule Take 1  capsule (100 mg total) by mouth 2 (two) times daily. 12/08/20   Cristal Deer, MD  levothyroxine (SYNTHROID, LEVOTHROID) 75 MCG tablet Take 75 mcg by mouth daily before breakfast.    [provider]  menthol-cetylpyridinium (CEPACOL) 3 MG lozenge Take 1 lozenge (3 mg total) by mouth as needed for sore throat. 12/08/20   Cristal Deer, MD  Multiple Vitamins-Minerals (MULTIVITAMIN ADULT) TABS Take 1 tablet by mouth daily.    [provider]  mupirocin ointment (BACTROBAN) 2 % Place 1 application into the nose 2 (two) times daily. 12/08/20   Cristal Deer, MD  oxymetazoline (AFRIN) 0.05 % nasal spray Place 1 spray into both  nostrils daily as needed for congestion.    [provider]  raloxifene (EVISTA) 60 MG tablet Take 60 mg by mouth daily.    [provider]  simvastatin (ZOCOR) 5 MG tablet Take 5 mg by mouth every other day.    [provider]  telmisartan (MICARDIS) 80 MG tablet Take 80 mg by mouth daily. 10/30/20   [provider]  vitamin C (ASCORBIC ACID) 500 MG tablet Take 500 mg by mouth daily.    [provider]    Current Facility-Administered Medications  Medication Dose Route Frequency Provider Last Rate Last Admin   acetaminophen (TYLENOL) tablet 650 mg  650 mg Oral Q6H PRN Reubin Milan, MD       Or   acetaminophen (TYLENOL) suppository 650 mg  650 mg Rectal Q6H PRN Reubin Milan, MD       lactated ringers infusion   Intravenous Continuous Reubin Milan, MD       ondansetron [facility]) tablet 4 mg  4 mg Oral Q6H PRN Reubin Milan, MD       Or   ondansetron [facility]) injection 4 mg  4 mg Intravenous Q6H PRN Reubin Milan, MD       pantoprazole (PROTONIX) injection 40 mg  40 mg Intravenous Q12H Reubin Milan, MD       Current Outpatient Medications  Medication Sig Dispense Refill   acetaminophen (TYLENOL) 325 MG tablet Take 1-2 tablets (325-650 mg total) by mouth every 6 (six) hours as needed for mild pain (pain score 1-3 or temp > 100.5). 60 tablet 0   amLODipine (NORVASC) 5 MG tablet Take 5 mg by mouth daily.     Cholecalciferol (VITAMIN D) 125 MCG (5000 UT) CAPS Take 5,000 Units by mouth daily.     docusate sodium (COLACE) 100 MG capsule Take 1 capsule (100 mg total) by mouth 2 (two) times daily. 10 capsule 0   levothyroxine (SYNTHROID, LEVOTHROID) 75 MCG tablet Take 75 mcg by mouth daily before breakfast.     menthol-cetylpyridinium (CEPACOL) 3 MG lozenge Take 1 lozenge (3 mg total) by mouth as needed for sore throat. 100 tablet 12   Multiple Vitamins-Minerals (MULTIVITAMIN ADULT) TABS Take 1 tablet by mouth daily.      mupirocin ointment (BACTROBAN) 2 % Place 1 application into the nose 2 (two) times daily. 22 g 0   oxymetazoline (AFRIN) 0.05 % nasal spray Place 1 spray into both nostrils daily as needed for congestion.     raloxifene (EVISTA) 60 MG tablet Take 60 mg by mouth daily.     simvastatin (ZOCOR) 5 MG tablet Take 5 mg by mouth every other day.     telmisartan (MICARDIS) 80 MG tablet Take 80 mg by mouth daily.     vitamin C (ASCORBIC ACID) 500 MG tablet  Take 500 mg by mouth daily.      Allergies as of 12/29/2021   (No Known Allergies)    Review of Systems:    Constitutional: No weight loss, fever or chills Skin: No rash  Cardiovascular: No chest pain  Respiratory: No SOB  Gastrointestinal: See HPI and otherwise negative Genitourinary: No dysuria  Neurological: No headache, dizziness or syncope Musculoskeletal: No new muscle or joint pain Hematologic: +melena Psychiatric: No history of depression or anxiety    Physical Exam:  Vital signs in last 24 hours: Temp:  [97.8 F (36.6 C)] 97.8 F (36.6 C) (02/27 1230) Pulse Rate:  [73-92] 73 (02/27 1330) Resp:  [18] 18 (02/27 1330) BP: (120-164)/(45-61) 120/45 (02/27 1330) SpO2:  [100 %] 100 % (02/27 1330) Weight:  [47.8 kg] 47.8 kg (02/27 1231)   General:   Pleasant frail appearing, elderly Caucasian female appears to be in NAD, Well developed, Well nourished, alert and cooperative Head:  Normocephalic and atraumatic. Eyes:   PEERL, EOMI. No icterus. Conjunctiva pink. Ears:  Normal auditory acuity. Neck:  Supple Throat: Oral cavity and pharynx without inflammation, swelling or lesion. Lungs: Respirations even and unlabored. Lungs clear to auscultation bilaterally.   No wheezes, crackles, or rhonchi.  Heart: Normal S1, S2. No MRG. Regular rate and rhythm.  Abdomen:  Soft, nondistended, nontender. No rebound or guarding. Normal bowel sounds. No appreciable masses or hepatomegaly. Rectal:  Not performed.  Msk:  Symmetrical without  gross deformities. Peripheral pulses intact.  Extremities:  Without edema, no deformity or joint abnormality.  Neurologic:  Alert and  oriented x4;  grossly normal neurologically.  Skin:   Dry and intact without significant lesions or rashes. Psychiatric: Demonstrates good judgement and reason without abnormal affect or behaviors.   LAB RESULTS: Recent Labs    12/29/21 1241  WBC 8.7  HGB 6.8*  HCT 22.2*  PLT 359   BMET Recent Labs    12/29/21 1241  NA 133*  K 4.2  CL 106  CO2 23  GLUCOSE 113*  BUN 27*  CREATININE 0.66  CALCIUM 8.6*   LFT Recent Labs    12/29/21 1241  PROT 5.6*  ALBUMIN 3.5  AST 20  ALT 15  ALKPHOS 43  BILITOT 0.2*   PT/INR Recent Labs    12/29/21 1241  LABPROT 12.8  INR 1.0     Impression / Plan:   Impression: 1.  Symptomatic iron deficiency anemia: Guiac + at PCP, Hemoglobin 6.8 at time of arrival, patient has been anemic for 4 months and on oral iron supplementation with a Flintstones vitamin, now describing some melena over the past 24 to 48 hours; consider upper GI versus lower GI source versus other  Plan: 1.  Plans for EGD and colonoscopy tomorrow with Dr. Lorenso Courier for further evaluation of symptomatic iron deficiency anemia.  Did discuss risks, benefits, limitations and alternatives and the patient agrees to proceed. 2.  Patient will need at least 1 unit of PRBCs overnight to ensure that her hemoglobin is greater than 7 in time for procedures tomorrow.  Appreciate assistance with this by hospitalist team. 3.  Patient will be on a clear liquid diet today and n.p.o. at midnight 4.  Patient be started on movi prep in split dose fashion at around 4:00 5.  Continue to monitor hemoglobin and transfusion as needed less than 7  Thank you for your kind consultation, we will continue to follow.  Lavone Nian Mostyn Varnell  12/29/2021, 2:50 PM

## 2021-12-29 NOTE — H&P (Signed)
History and Physical    Patient: Cheryl Maldonado:[id number] DOB: Feb 07, 1933 DOA: 12/29/2021 DOS: the patient was seen and examined on 12/29/2021 PCP: Holland Commons, FNP  Patient coming from: Home  Chief Complaint: No chief complaint on file.   HPI: Cheryl Maldonado is a 88 y.o. female with medical history significant of seasonal allergies, breast cancer, hypertension, hypervitaminosis D, left hip fracture, uterine cancer who is coming to the emergency department referred by her primary provider due to hemoglobin level of 6.8 g/dL and positive FOBT, down from 11.8 g/dL in November and 8.1 g/dL last week.  She has had at least 2 episodes of melena in the past few days.  She takes a daily 81 mg aspirin.  She has not been taking OTC or prescribed NSAIDs.  Per caregiver, she has not been the same.  She has felt weaker and has had postural dizziness in the last few days.  No hematochezia, dyspepsia, abdominal pain, nausea, emesis, diarrhea or constipation.  Her appetite is mildly decreased but she has not had any significant weight loss.  No headache, sore throat, productive cough, dyspnea, wheezing or hemoptysis.  No chest pain, palpitations, orthopnea or pitting edema of the lower extremities.  She has chronic RLE lymphedema.  No flank pain, dysuria or hematuria.  No polyuria, polydipsia, polyphagia or blurred vision.  ED course: Initial vital signs were temperature 97.8 F, pulse 92, respiration 18, BP 164/61 mmHg O2 sat 100% on room air.  The patient received pantoprazole 40 mg IVP x1.  Labwork: Her CBC is her white count 8.7, hemoglobin 6.8 g/dL and platelets 359.  CMP showed a sodium 133 mmol/L.  All other electrolytes are normal when calcium is corrected.  Glucose 113, BUN 27, creatinine 0.66 mg/dL and GFR was more than 60 mL/min.  LFTs were normal, except for a total protein of 5.6 g/dL.  Anemia panel shows iron deficiency and increased reticulocyte count.  Review of Systems: As mentioned in the  history of present illness. All other systems reviewed and are negative. Past Medical History:  Diagnosis Date   Breast cancer ([place])    Hypertension    Intertrochanteric fracture of left hip ([place]) 04/11/2019   Uterine cancer ([place])    Past Surgical History:  Procedure Laterality Date   ABDOMINAL HYSTERECTOMY     COLONOSCOPY     FEMUR IM NAIL Left 04/13/2019   Procedure: INTRAMEDULLARY (IM) NAIL FEMORAL;  Surgeon: Rod Can, MD;  Location: [facility];  Service: Orthopedics;  Laterality: Left;   TOTAL HIP ARTHROPLASTY Right 12/06/2020   Procedure: TOTAL HIP ARTHROPLASTY ANTERIOR APPROACH;  Surgeon: Rod Can, MD;  Location: [facility];  Service: Orthopedics;  Laterality: Right;   Social History:  reports that she has never smoked. She has never used smokeless tobacco. She reports that she does not drink alcohol and does not use drugs.  No Known Allergies  Family History  Problem Relation Age of Onset   Cancer Mother     Prior to Admission medications   Medication Sig Start Date End Date Taking? Authorizing Provider  acetaminophen (TYLENOL) 325 MG tablet Take 1-2 tablets (325-650 mg total) by mouth every 6 (six) hours as needed for mild pain (pain score 1-3 or temp > 100.5). 12/08/20   Cristal Deer, MD  amLODipine (NORVASC) 5 MG tablet Take 5 mg by mouth daily.    [provider]  Cholecalciferol (VITAMIN D) 125 MCG (5000 UT) CAPS Take 5,000 Units by mouth daily.    [provider]  docusate sodium (COLACE) 100 MG capsule Take 1 capsule (100 mg total) by mouth 2 (two) times daily. 12/08/20   Cristal Deer, MD  levothyroxine (SYNTHROID, LEVOTHROID) 75 MCG tablet Take 75 mcg by mouth daily before breakfast.    [provider]  menthol-cetylpyridinium (CEPACOL) 3 MG lozenge Take 1 lozenge (3 mg total) by mouth as needed for sore throat. 12/08/20   Cristal Deer, MD  Multiple Vitamins-Minerals (MULTIVITAMIN ADULT) TABS Take 1 tablet by mouth daily.     [provider]  mupirocin ointment (BACTROBAN) 2 % Place 1 application into the nose 2 (two) times daily. 12/08/20   Cristal Deer, MD  oxymetazoline (AFRIN) 0.05 % nasal spray Place 1 spray into both nostrils daily as needed for congestion.    [provider]  raloxifene (EVISTA) 60 MG tablet Take 60 mg by mouth daily.    [provider]  simvastatin (ZOCOR) 5 MG tablet Take 5 mg by mouth every other day.    [provider]  telmisartan (MICARDIS) 80 MG tablet Take 80 mg by mouth daily. 10/30/20   [provider]  vitamin C (ASCORBIC ACID) 500 MG tablet Take 500 mg by mouth daily.    [provider]    Physical Exam: Vitals:   12/29/21 1230 12/29/21 1231 12/29/21 1330 12/29/21 1606  BP: (!) 164/61  (!) 120/45 (!) 138/53  Pulse: 92  73 82  Resp: 18  18 19   Temp: 97.8 F (36.6 C)   98.1 F (36.7 C)  TempSrc: Oral   Oral  SpO2: 100%  100% 99%  Weight:  47.8 kg    Height:  4' 10.5" (1.486 m)     Physical Exam HENT:     Head: Normocephalic.     Mouth/Throat:     Mouth: Mucous membranes are moist.  Eyes:     General: No scleral icterus.    Pupils: Pupils are equal, round, and reactive to light.  Neck:     Vascular: No JVD.  Cardiovascular:     Rate and Rhythm: Normal rate and regular rhythm.     Heart sounds: S1 normal and S2 normal.  Pulmonary:     Effort: Pulmonary effort is normal.     Breath sounds: Normal breath sounds. No wheezing, rhonchi or rales.  Abdominal:     General: There is no distension.     Palpations: Abdomen is soft.     Tenderness: There is no abdominal tenderness.  Musculoskeletal:     Cervical back: Neck supple.     Comments: RLE lymphedema.  Skin:    General: Skin is warm and dry.  Neurological:     General: No focal deficit present.     Mental Status: She is alert and oriented to person, place, and time.  Psychiatric:        Mood and Affect: Mood normal.        Behavior: Behavior normal.    Data Reviewed:  There are no new results to review at this time.  Assessment and Plan: Principal Problem:   Melena Resulting in worsening of   Iron deficiency anemia due to chronic blood loss Observation/telemetry. Clear liquid diet. N.p.o. after midnight. Transfuse 1 unit of PRBC. Monitor hematocrit and hemoglobin. Transfuse further as needed. Continue pantoprazole 40 mg IVP every 12 hours. GI evaluation appreciated.  Active Problems:   Essential hypertension Hold antihypertensives for now. Monitor blood pressure closely.    Hypothyroidism Continue levothyroxine 75 mcg p.o. daily.  Hyperlipidemia Continue simvastatin 5 mg of your other day after med rec performed.    Lymphedema of right lower extremity Patient stated she has had these for years. It has been check for DVT multiple times.     Advance Care Planning:   Code Status: Full Code   Consults: [facility] Dayna Barker, MD and Ellouise Newer, PA-C)  Family Communication:   Severity of Illness: The appropriate patient status for this patient is OBSERVATION. Observation status is judged to be reasonable and necessary in order to provide the required intensity of service to ensure the patient's safety. The patient's presenting symptoms, physical exam findings, and initial radiographic and laboratory data in the context of their medical condition is felt to place them at decreased risk for further clinical deterioration. Furthermore, it is anticipated that the patient will be medically stable for discharge from the hospital within 2 midnights of admission.   Author: Reubin Milan, MD 12/29/2021 5:27 PM  For on call review [email or web address].   This document was prepared using Dragon voice recognition software and may contain some unintended transcription errors.

## 2021-12-30 ENCOUNTER — Observation Stay (HOSPITAL_COMMUNITY): Payer: Medicare PPO | Admitting: Certified Registered"

## 2021-12-30 ENCOUNTER — Encounter (HOSPITAL_COMMUNITY)
Admission: EM | Disposition: A | Discharge status: Home Health | Payer: Self-pay | Source: Home / Self Care | Attending: Emergency Medicine

## 2021-12-30 ENCOUNTER — Encounter (HOSPITAL_COMMUNITY): Payer: Self-pay

## 2021-12-30 ENCOUNTER — Observation Stay (HOSPITAL_BASED_OUTPATIENT_CLINIC_OR_DEPARTMENT_OTHER): Payer: Medicare PPO | Admitting: Certified Registered"

## 2021-12-30 DIAGNOSIS — K259 Gastric ulcer, unspecified as acute or chronic, without hemorrhage or perforation: Secondary | ICD-10-CM

## 2021-12-30 DIAGNOSIS — K635 Polyp of colon: Secondary | ICD-10-CM

## 2021-12-30 DIAGNOSIS — D122 Benign neoplasm of ascending colon: Secondary | ICD-10-CM | POA: Diagnosis not present

## 2021-12-30 DIAGNOSIS — K921 Melena: Secondary | ICD-10-CM | POA: Diagnosis not present

## 2021-12-30 DIAGNOSIS — I10 Essential (primary) hypertension: Secondary | ICD-10-CM | POA: Diagnosis not present

## 2021-12-30 DIAGNOSIS — Z8711 Personal history of peptic ulcer disease: Secondary | ICD-10-CM

## 2021-12-30 DIAGNOSIS — K22719 Barrett's esophagus with dysplasia, unspecified: Secondary | ICD-10-CM | POA: Diagnosis not present

## 2021-12-30 DIAGNOSIS — K3189 Other diseases of stomach and duodenum: Secondary | ICD-10-CM

## 2021-12-30 DIAGNOSIS — K648 Other hemorrhoids: Secondary | ICD-10-CM | POA: Diagnosis not present

## 2021-12-30 DIAGNOSIS — D509 Iron deficiency anemia, unspecified: Secondary | ICD-10-CM

## 2021-12-30 DIAGNOSIS — K621 Rectal polyp: Secondary | ICD-10-CM

## 2021-12-30 DIAGNOSIS — K209 Esophagitis, unspecified without bleeding: Secondary | ICD-10-CM

## 2021-12-30 DIAGNOSIS — D128 Benign neoplasm of rectum: Secondary | ICD-10-CM | POA: Diagnosis not present

## 2021-12-30 DIAGNOSIS — K449 Diaphragmatic hernia without obstruction or gangrene: Secondary | ICD-10-CM

## 2021-12-30 DIAGNOSIS — K208 Other esophagitis without bleeding: Secondary | ICD-10-CM

## 2021-12-30 DIAGNOSIS — K5730 Diverticulosis of large intestine without perforation or abscess without bleeding: Secondary | ICD-10-CM | POA: Diagnosis not present

## 2021-12-30 DIAGNOSIS — Z20822 Contact with and (suspected) exposure to covid-19: Secondary | ICD-10-CM | POA: Diagnosis not present

## 2021-12-30 HISTORY — PX: COLONOSCOPY WITH PROPOFOL: SHX5780

## 2021-12-30 HISTORY — PX: BIOPSY: SHX5522

## 2021-12-30 HISTORY — PX: POLYPECTOMY: SHX5525

## 2021-12-30 HISTORY — PX: ESOPHAGOGASTRODUODENOSCOPY (EGD) WITH PROPOFOL: SHX5813

## 2021-12-30 LAB — COMPREHENSIVE METABOLIC PANEL
ALT: 15 U/L (ref 0–44)
AST: 20 U/L (ref 15–41)
Albumin: 2.6 g/dL — ABNORMAL LOW (ref 3.5–5.0)
Alkaline Phosphatase: 36 U/L — ABNORMAL LOW (ref 38–126)
Anion gap: 6 (ref 5–15)
BUN: 18 mg/dL (ref 8–23)
CO2: 19 mmol/L — ABNORMAL LOW (ref 22–32)
Calcium: 8.4 mg/dL — ABNORMAL LOW (ref 8.9–10.3)
Chloride: 111 mmol/L (ref 98–111)
Creatinine, Ser: 0.77 mg/dL (ref 0.44–1.00)
GFR, Estimated: >60 mL/min (ref >60)
Glucose, Bld: 91 mg/dL (ref 70–99)
Potassium: 4 mmol/L (ref 3.5–5.1)
Sodium: 136 mmol/L (ref 135–145)
Total Bilirubin: 0.4 mg/dL (ref 0.3–1.2)
Total Protein: 4.4 g/dL — ABNORMAL LOW (ref 6.5–8.1)

## 2021-12-30 LAB — TYPE AND SCREEN
ABO/RH(D): A POS
Antibody Screen: NEGATIVE
Unit division: 0

## 2021-12-30 LAB — HEMOGLOBIN AND HEMATOCRIT, BLOOD
HCT: 31.1 % — ABNORMAL LOW (ref 36.0–46.0)
Hemoglobin: 9 g/dL — ABNORMAL LOW (ref 12.0–15.0)

## 2021-12-30 LAB — CBC
HCT: 25.1 % — ABNORMAL LOW (ref 36.0–46.0)
Hemoglobin: 7.8 g/dL — ABNORMAL LOW (ref 12.0–15.0)
MCH: 32.2 pg (ref 26.0–34.0)
MCHC: 31.1 g/dL (ref 30.0–36.0)
MCV: 103.7 fL — ABNORMAL HIGH (ref 80.0–100.0)
Platelets: 285 10*3/uL (ref 150–400)
RBC: 2.42 MIL/uL — ABNORMAL LOW (ref 3.87–5.11)
RDW: 19.9 % — ABNORMAL HIGH (ref 11.5–15.5)
WBC: 6.8 10*3/uL (ref 4.0–10.5)
nRBC: 0 % (ref 0.0–0.2)

## 2021-12-30 LAB — BPAM RBC
Blood Product Expiration Date: 202303102359
ISSUE DATE / TIME: 202302271756
Unit Type and Rh: 6200

## 2021-12-30 SURGERY — ESOPHAGOGASTRODUODENOSCOPY (EGD) WITH PROPOFOL
Anesthesia: Monitor Anesthesia Care

## 2021-12-30 MED ORDER — PROPOFOL 1000 MG/100ML IV EMUL
INTRAVENOUS | Status: AC
  Filled 2021-12-30: qty 100

## 2021-12-30 MED ORDER — PROPOFOL 10 MG/ML IV BOLUS
INTRAVENOUS | Status: AC
  Filled 2021-12-30: qty 20

## 2021-12-30 MED ORDER — LACTATED RINGERS IV SOLN: INTRAVENOUS | Status: DC

## 2021-12-30 MED ORDER — PHENYLEPHRINE 40 MCG/ML (10ML) SYRINGE FOR IV PUSH (FOR BLOOD PRESSURE SUPPORT)
PREFILLED_SYRINGE | INTRAVENOUS | Status: DC | PRN
  Administered 2021-12-30: 60 ug via INTRAVENOUS
  Administered 2021-12-30 (×2): 40 ug via INTRAVENOUS
  Administered 2021-12-30: 60 ug via INTRAVENOUS
  Administered 2021-12-30: 40 ug via INTRAVENOUS
  Administered 2021-12-30: 80 ug via INTRAVENOUS

## 2021-12-30 MED ORDER — PROPOFOL 10 MG/ML IV BOLUS
INTRAVENOUS | Status: DC | PRN
  Administered 2021-12-30 (×2): 10 mg via INTRAVENOUS
  Administered 2021-12-30: 20 mg via INTRAVENOUS

## 2021-12-30 MED ORDER — PANTOPRAZOLE SODIUM 40 MG PO TBEC: 40.0000 mg | DELAYED_RELEASE_TABLET | Freq: Every day | ORAL | 2 refills | Status: DC

## 2021-12-30 MED ORDER — LIDOCAINE 2% (20 MG/ML) 5 ML SYRINGE
INTRAMUSCULAR | Status: DC | PRN
  Administered 2021-12-30: 40 mg via INTRAVENOUS

## 2021-12-30 MED ORDER — PROPOFOL 500 MG/50ML IV EMUL
INTRAVENOUS | Status: DC | PRN
Start: 2021-12-30 — End: 2021-12-30
  Administered 2021-12-30: 100 ug/kg/min via INTRAVENOUS

## 2021-12-30 MED ORDER — SODIUM CHLORIDE 0.9 % IV SOLN: INTRAVENOUS | Status: DC

## 2021-12-30 MED ORDER — SUCRALFATE 1 GM/10ML PO SUSP: 1.0000 g | Freq: Four times a day (QID) | ORAL | 0 refills | Status: DC

## 2021-12-30 SURGICAL SUPPLY — 25 items

## 2021-12-30 NOTE — Hospital Course (Addendum)
88 y.o. female with medical history significant of seasonal allergies, breast cancer, hypertension, hypervitaminosis D, left hip fracture, uterine cancer presented to ED on advice from PCP due to hemoglobin of 6.8 g and positive FOBT, down from 11.8 g/dL in November and 8.1 g/dL last week. She has had at least 2 episodes of melena in the past few days. In the ED,hb 6.8 g,rest of the labs fairly stable normal LFTs anemia panel with iron deficiency anemia increased reticulocyte count, patient was admitted for melena iron deficiency anemia chronic blood loss, 1 unit PRBC ordered GI consulted and admitted for further management.  Hemoglobin improved with 1 unit PRBC.  Underwent EGD and colonoscopy by Dr. Gigi Gin C esophagitis, suspicious for Barrett's esophagus, nonbleeding gastric ulcer with clean base, large hiatal hernia erythematous mucosa in the antrum biopsied.  Diet changed to heart healthy and tolerated well, advised PPI twice daily x12 weeks followed by daily,Sucralfate suspension 1 gram PO QID for 4 wk.  Colonoscopy showed polyps in transverse colon,diverticulosis.

## 2021-12-30 NOTE — Discharge Summary (Signed)
Physician Discharge Summary   Patient: Cheryl Maldonado MRN: [id number] DOB: 1933-01-13  Admit date:     12/29/2021  Discharge date: 12/30/21  Discharge Physician: Antonieta Pert   PCP: Holland Commons, FNP   Recommendations at discharge:    PCP fu Cbc in 1 wk  Hospital Course: 88 y.o. female with medical history significant of seasonal allergies, breast cancer, hypertension, hypervitaminosis D, left hip fracture, uterine cancer presented to ED on advice from PCP due to hemoglobin of 6.8 g and positive FOBT, down from 11.8 g/dL in November and 8.1 g/dL last week. She has had at least 2 episodes of melena in the past few days. In the ED,hb 6.8 g,rest of the labs fairly stable normal LFTs anemia panel with iron deficiency anemia increased reticulocyte count, patient was admitted for melena iron deficiency anemia chronic blood loss, 1 unit PRBC ordered GI consulted and admitted for further management.  Hemoglobin improved with 1 unit PRBC.  Underwent EGD and colonoscopy by Dr. Gigi Gin C esophagitis, suspicious for Barrett's esophagus, nonbleeding gastric ulcer with clean base, large hiatal hernia erythematous mucosa in the antrum biopsied.  Diet changed to heart healthy and tolerated well, advised PPI twice daily x12 weeks followed by daily,Sucralfate suspension 1 gram PO QID for 4 wk.  Colonoscopy showed polyps in transverse colon,diverticulosis. Diet was advanced and tolerated well, and remains medically stable for discharge home with instruction to follow-up  Discharge Diagnoses: Melena iron deficiency anemia / anemia of chronic blood loss/[place] grade C esophagitis Gastric ulcer Large hiatal hernia: s/p 1 unit PRBCm,GI consulted and admitted for further management.  Hemoglobin improved with 1 unit PRBC.  Underwent EGD and colonoscopy by Dr. Gigi Gin C esophagitis, suspicious for Barrett's esophagus, nonbleeding gastric ulcer with clean base, large hiatal hernia erythematous mucosa in the  antrum biopsied.  Diet changed to heart healthy and tolerated well, advised PPI twice daily x12 weeks followed by daily,Sucralfate suspension 1 gram PO QID for 4 wk.  Colonoscopy showed polyps in transverse colon,diverticulosis.  On the problems are chronic and stable patient will continue on home regimen and PPI/sucralfate therapy as above Essential hypertension Hyperlipidemia Lymphedema of right lower extremity      Consultants: GI Procedures performed: EGD, COLO  Disposition: Home Diet recommendation:  Regular diet  DISCHARGE MEDICATION: Allergies as of 12/30/2021   No Known Allergies      Medication List     STOP taking these medications    docusate sodium 100 MG capsule Commonly known as: COLACE       TAKE these medications    acetaminophen 325 MG tablet Commonly known as: TYLENOL Take 1-2 tablets (325-650 mg total) by mouth every 6 (six) hours as needed for mild pain (pain score 1-3 or temp > 100.5).   amLODipine 5 MG tablet Commonly known as: NORVASC Take 5 mg by mouth daily.   FLINTSTONES W/IRON PO Take 1 tablet by mouth daily.   levothyroxine 75 MCG tablet Commonly known as: SYNTHROID Take 75 mcg by mouth daily before breakfast.   menthol-cetylpyridinium 3 MG lozenge Commonly known as: CEPACOL Take 1 lozenge (3 mg total) by mouth as needed for sore throat.   oxymetazoline 0.05 % nasal spray Commonly known as: AFRIN Place 1 spray into both nostrils daily as needed for congestion.   pantoprazole 40 MG tablet Commonly known as: Protonix Take 1 tablet (40 mg total) by mouth daily.   raloxifene 60 MG tablet Commonly known as: EVISTA Take 60 mg by mouth daily.  simvastatin 5 MG tablet Commonly known as: ZOCOR Take 5 mg by mouth every other day.   sucralfate 1 GM/10ML suspension Commonly known as: Carafate Take 10 mLs (1 g total) by mouth 4 (four) times daily.   telmisartan 80 MG tablet Commonly known as: MICARDIS Take 80 mg by mouth  daily.        Follow-up Information     Holland Commons, FNP Follow up in 1 week(s).   Specialty: Internal Medicine Contact information: [address] [place] [address] [phone]         Sharyn Creamer, MD Follow up in 2 week(s).   Specialty: Gastroenterology Contact information: [place] [address] [phone]                 Discharge Exam: Danley Danker Weights   12/29/21 1231  Weight: 47.8 kg    Condition at discharge: good  The results of significant diagnostics from this hospitalization (including imaging, microbiology, ancillary and laboratory) are listed below for reference.   Imaging Studies: No results found.  Microbiology: Results for orders placed or performed during the hospital encounter of 12/29/21  Resp Panel by RT-PCR (Flu A&B, Covid) Nasopharyngeal Swab     Status: None   Collection Time: 12/29/21 12:41 PM   Specimen: Nasopharyngeal Swab; Nasopharyngeal(NP) swabs in vial transport medium  Result Value Ref Range Status   SARS Coronavirus 2 by RT PCR NEGATIVE NEGATIVE Final    Comment: (NOTE) SARS-CoV-2 target nucleic acids are NOT DETECTED.  The SARS-CoV-2 RNA is generally detectable in upper respiratory specimens during the acute phase of infection. The lowest concentration of SARS-CoV-2 viral copies this assay can detect is 138 copies/mL. A negative result does not preclude SARS-Cov-2 infection and should not be used as the sole basis for treatment or other patient management decisions. A negative result may occur with  improper specimen collection/handling, submission of specimen other than nasopharyngeal swab, presence of viral mutation(s) within the areas targeted by this assay, and inadequate number of viral copies(<138 copies/mL). A negative result must be combined with clinical observations, patient history, and epidemiological information. The expected result is Negative.  Fact  Sheet for Patients:  EntrepreneurPulse.com.au  Fact Sheet for Healthcare Providers:  IncredibleEmployment.be  This test is no t yet approved or cleared by the [place] FDA and  has been authorized for detection and/or diagnosis of SARS-CoV-2 by FDA under an Emergency Use Authorization (EUA). This EUA will remain  in effect (meaning this test can be used) for the duration of the COVID-19 declaration under Section 564(b)(1) of the Act, 21 U.S.C.section 360bbb-3(b)(1), unless the authorization is terminated  or revoked sooner.       Influenza A by PCR NEGATIVE NEGATIVE Final   Influenza B by PCR NEGATIVE NEGATIVE Final    Comment: (NOTE) The Xpert Xpress SARS-CoV-2/FLU/RSV plus assay is intended as an aid in the diagnosis of influenza from Nasopharyngeal swab specimens and should not be used as a sole basis for treatment. Nasal washings and aspirates are unacceptable for Xpert Xpress SARS-CoV-2/FLU/RSV testing.  Fact Sheet for Patients: EntrepreneurPulse.com.au  Fact Sheet for Healthcare Providers: IncredibleEmployment.be  This test is not yet approved or cleared by the [place] FDA and has been authorized for detection and/or diagnosis of SARS-CoV-2 by FDA under an Emergency Use Authorization (EUA). This EUA will remain in effect (meaning this test can be used) for the duration of the COVID-19 declaration under Section 564(b)(1) of the  Act, 21 U.S.C. section 360bbb-3(b)(1), unless the authorization is terminated or revoked.  Performed at [facility], [place] [address], [place], [place] [address]     Labs: CBC: Recent Labs  Lab 12/29/21 1241 12/29/21 2252 12/30/21 0508 12/30/21 1244  WBC 8.7  --  6.8  --   NEUTROABS 7.0  --   --   --   HGB 6.8* 9.0* 7.8* 9.0*  HCT 22.2* 27.5* 25.1* 31.1*  MCV 107.2*  --  103.7*  --   PLT 359  --  285  --    Basic Metabolic  Panel: Recent Labs  Lab 12/29/21 1241 12/30/21 0508  NA 133* 136  K 4.2 4.0  CL 106 111  CO2 23 19*  GLUCOSE 113* 91  BUN 27* 18  CREATININE 0.66 0.77  CALCIUM 8.6* 8.4*   Liver Function Tests: Recent Labs  Lab 12/29/21 1241 12/30/21 0508  AST 20 20  ALT 15 15  ALKPHOS 43 36*  BILITOT 0.2* 0.4  PROT 5.6* 4.4*  ALBUMIN 3.5 2.6*   CBG: No results for input(s): GLUCAP in the last 168 hours.  Discharge time spent: 250 minutes.  Signed: Antonieta Pert, MD [facility] 12/30/2021

## 2021-12-30 NOTE — Interval H&P Note (Signed)
History and Physical Interval Note:  12/30/2021 9:20 AM  Cheryl Maldonado  has presented today for surgery, with the diagnosis of IDA.  The various methods of treatment have been discussed with the patient and family. After consideration of risks, benefits and other options for treatment, the patient has consented to  Procedure(s): ESOPHAGOGASTRODUODENOSCOPY (EGD) WITH PROPOFOL (N/A) COLONOSCOPY WITH PROPOFOL (N/A) as a surgical intervention.  The patient's history has been reviewed, patient examined, no change in status, stable for surgery.  I have reviewed the patient's chart and labs.  Questions were answered to the patient's satisfaction.     Sharyn Creamer

## 2021-12-30 NOTE — Anesthesia Postprocedure Evaluation (Signed)
Anesthesia Post Note  Patient: Annaleia Balderson  Procedure(s) Performed: ESOPHAGOGASTRODUODENOSCOPY (EGD) WITH PROPOFOL COLONOSCOPY WITH PROPOFOL BIOPSY POLYPECTOMY     Patient location during evaluation: Endoscopy Anesthesia Type: MAC Level of consciousness: awake and alert Pain management: pain level controlled Vital Signs Assessment: post-procedure vital signs reviewed and stable Respiratory status: spontaneous breathing, nonlabored ventilation and respiratory function stable Cardiovascular status: stable and blood pressure returned to baseline Postop Assessment: no apparent nausea or vomiting Anesthetic complications: no   No notable events documented.  Last Vitals:  Vitals:   12/30/21 1100 12/30/21 1110  BP: (!) 117/40 (!) 114/48  Pulse: 70 70  Resp: 19 18  Temp: 36.7 C   SpO2: 98% 97%    Last Pain:  Vitals:   12/30/21 1110  TempSrc:   PainSc: 0-No pain                 Nitesh Pitstick,W. EDMOND

## 2021-12-30 NOTE — Op Note (Signed)
[facility] Patient Name: Jamin Humphries Procedure Date: 12/30/2021 MRN: [id number] Attending MD: Georgian Co ,  Date of Birth: Mar 07, 1933 CSN: [id number] Age: 88 Admit Type: Outpatient Procedure:                Upper GI endoscopy Indications:              Iron deficiency anemia Providers:                Adline Mango" Lady Deutscher, RN,                            William Dalton, Technician Referring MD:             Hospitalist team Medicines:                Monitored Anesthesia Care Complications:            No immediate complications. Estimated Blood Loss:     Estimated blood loss was minimal. Procedure:                Pre-Anesthesia Assessment:                           - Prior to the procedure, a History and Physical                            was performed, and patient medications and                            allergies were reviewed. The patient's tolerance of                            previous anesthesia was also reviewed. The risks                            and benefits of the procedure and the sedation                            options and risks were discussed with the patient.                            All questions were answered, and informed consent                            was obtained. Prior Anticoagulants: The patient has                            taken no previous anticoagulant or antiplatelet                            agents. ASA Grade Assessment: III - A patient with                            severe systemic disease. After reviewing the risks  and benefits, the patient was deemed in                            satisfactory condition to undergo the procedure.                           After obtaining informed consent, the endoscope was                            passed under direct vision. Throughout the                            procedure, the patient's blood pressure, pulse, and                             oxygen saturations were monitored continuously. The                            [id number]-[id number] ([id number]) Olympus endoscope was introduced                            through the mouth, and advanced to the second part                            of duodenum. The upper GI endoscopy was                            accomplished without difficulty. The patient                            tolerated the procedure well. Scope In: Scope Out: Findings:      LA Grade C (one or more mucosal breaks continuous between tops of 2 or       more mucosal folds, less than 75% circumference) esophagitis with no       bleeding was found in the distal esophagus. There was some nodularity at       the GE junction. Biopsies were taken with a cold forceps for histology.      Circumferential salmon-colored mucosa was present from 29 to 31 cm.       Hiatal narrowing was identified at 36 cm and ulcerations were present at       30 cm. The maximum longitudinal extent of these esophageal mucosal       changes was 1 cm in length.      Two non-bleeding cratered gastric ulcers with a clean ulcer base       (Forrest Class III) were found in the cardia. The largest lesion was 7       mm in largest dimension.      A large hiatal hernia was present.      Localized mildly erythematous mucosa without bleeding was found in the       gastric antrum. Biopsies were taken with a cold forceps for Helicobacter       pylori testing.      The examined duodenum was normal. Biopsies were taken with a cold       forceps for histology. Impression:               -  LA Grade C esophagitis with no bleeding. Biopsied.                           - Salmon-colored mucosa suspicious for Barrett's                            esophagus.                           - Non-bleeding gastric ulcers with a clean ulcer                            base (Forrest Class III).                           - Large hiatal hernia.                           - Erythematous  mucosa in the antrum. Biopsied.                           - Normal examined duodenum. Biopsied. Moderate Sedation:      Not Applicable - Patient had care per Anesthesia. Recommendation:           - Use a proton pump inhibitor PO BID for 12 weeks,                            followed by QD.                           - Use sucralfate suspension 1 gram PO QID for 4                            weeks.                           - Await pathology results.                           - Barrett's biopsies were not collected due to                            inflammation that was present and patient's                            advanced age.                           - Perform a colonoscopy today. Procedure Code(s):        --- Professional ---                           [phone], Esophagogastroduodenoscopy, flexible,                            transoral; with biopsy, single or multiple Diagnosis Code(s):        ---  Professional ---                           K20.90, Esophagitis, unspecified without bleeding                           K22.8, Other specified diseases of esophagus                           K25.9, Gastric ulcer, unspecified as acute or                            chronic, without hemorrhage or perforation                           K44.9, Diaphragmatic hernia without obstruction or                            gangrene                           K31.89, Other diseases of stomach and duodenum                           D50.9, Iron deficiency anemia, unspecified CPT copyright 2019 American Medical Association. All rights reserved. The codes documented in this report are preliminary and upon coder review may  be revised to meet current compliance requirements. Sonny Masters "Christia Reading,  12/30/2021 10:50:15 AM Number of Addenda: 0

## 2021-12-30 NOTE — Anesthesia Preprocedure Evaluation (Addendum)
Anesthesia Evaluation  Patient identified by MRN, date of birth, ID band Patient awake    Reviewed: Allergy & Precautions, H&P , NPO status , Patient's Chart, lab work & pertinent test results  Airway Mallampati: II  TM Distance: >3 FB Neck ROM: Full    Dental no notable dental hx. (+) Teeth Intact, Dental Advisory Given   Pulmonary neg pulmonary ROS,    Pulmonary exam normal breath sounds clear to auscultation       Cardiovascular hypertension, Pt. on medications  Rhythm:Regular Rate:Normal     Neuro/Psych negative neurological ROS  negative psych ROS   GI/Hepatic negative GI ROS, Neg liver ROS,   Endo/Other  Hypothyroidism   Renal/GU Renal disease  negative genitourinary   Musculoskeletal   Abdominal   Peds  Hematology  (+) Blood dyscrasia, anemia ,   Anesthesia Other Findings   Reproductive/Obstetrics negative OB ROS                            Anesthesia Physical Anesthesia Plan  ASA: 2  Anesthesia Plan: MAC   Post-op Pain Management: Minimal or no pain anticipated   Induction: Intravenous  PONV Risk Score and Plan: 2 and Propofol infusion and Treatment may vary due to age or medical condition  Airway Management Planned: Natural Airway and Simple Face Mask  Additional Equipment:   Intra-op Plan:   Post-operative Plan:   Informed Consent: I have reviewed the patients History and Physical, chart, labs and discussed the procedure including the risks, benefits and alternatives for the proposed anesthesia with the patient or authorized representative who has indicated his/her understanding and acceptance.     Dental advisory given  Plan Discussed with: CRNA  Anesthesia Plan Comments:         Anesthesia Quick Evaluation

## 2021-12-30 NOTE — Op Note (Addendum)
[facility] Patient Name: Cheryl Maldonado Procedure Date: 12/30/2021 MRN: [id number] Attending MD: Georgian Co ,  Date of Birth: [birth year] CSN: [id number] Age: 88 Admit Type: Outpatient Procedure:                Colonoscopy Indications:              Iron deficiency anemia Providers:                Adline Mango" Lady Deutscher, RN,                            William Dalton, Technician Referring MD:             Hospitalist team Medicines:                Monitored Anesthesia Care Complications:            No immediate complications. Estimated Blood Loss:     Estimated blood loss was minimal. Procedure:                Pre-Anesthesia Assessment:                           - Prior to the procedure, a History and Physical                            was performed, and patient medications and                            allergies were reviewed. The patient's tolerance of                            previous anesthesia was also reviewed. The risks                            and benefits of the procedure and the sedation                            options and risks were discussed with the patient.                            All questions were answered, and informed consent                            was obtained. Prior Anticoagulants: The patient has                            taken no previous anticoagulant or antiplatelet                            agents. ASA Grade Assessment: III - A patient with                            severe systemic disease. After reviewing the risks  and benefits, the patient was deemed in                            satisfactory condition to undergo the procedure.                           After obtaining informed consent, the colonoscope                            was passed under direct vision. Throughout the                            procedure, the patient's blood pressure, pulse, and                             oxygen saturations were monitored continuously. The                            [id number]-HQ190L ([id number] Olympus colonoscope was                            introduced through the anus and advanced to the the                            cecum, identified by appendiceal orifice and                            ileocecal valve. The colonoscopy was performed                            without difficulty. The patient tolerated the                            procedure well. The quality of the bowel                            preparation was good. The ileocecal valve,                            appendiceal orifice, and rectum were photographed. Scope In: 10:03:56 AM Scope Out: 10:29:54 AM Scope Withdrawal Time: 0 hours 16 minutes 3 seconds  Total Procedure Duration: 0 hours 25 minutes 58 seconds  Findings:      Two sessile polyps were found in the transverse colon and cecum. The       polyps were 3 to 4 mm in size. These polyps were removed with a cold       snare. Resection and retrieval were complete.      Multiple small and large-mouthed diverticula were found in the sigmoid       colon.      A 5 mm polyp was found in the rectum. The polyp was sessile. The polyp       was removed with a hot snare. Resection and retrieval were complete.      Non-bleeding internal hemorrhoids were found during retroflexion. Impression:               -  Two 3 to 4 mm polyps in the transverse colon and                            in the cecum, removed with a cold snare. Resected                            and retrieved.                           - Diverticulosis in the sigmoid colon.                           - One 5 mm polyp in the rectum, removed with a hot                            snare. Resected and retrieved.                           - Non-bleeding internal hemorrhoids. Moderate Sedation:      Not Applicable - Patient had care per Anesthesia. Recommendation:           - Return patient to hospital ward for  ongoing care.                           - It is suspected that the patient's anemia is due                            to ulcers found at the GE junction/hiatal hernia                            and grade C esophagitis.                           - Await pathology results.                           - Return to GI clinic in 1 month. Repeat EGD to                            monitor for healing of ulcers and esophagitis was                            not arranged due to the patient's advanced age, but                            this could be discuss further in clinic if the                            patient is interested.                           - The findings and recommendations were discussed  with the patient. Procedure Code(s):        --- Professional ---                           [phone], Colonoscopy, flexible; with removal of                            tumor(s), polyp(s), or other lesion(s) by snare                            technique Diagnosis Code(s):        --- Professional ---                           K63.5, Polyp of colon                           K64.8, Other hemorrhoids                           K62.1, Rectal polyp                           D50.9, Iron deficiency anemia, unspecified                           K57.30, Diverticulosis of large intestine without                            perforation or abscess without bleeding CPT copyright 2019 American Medical Association. All rights reserved. The codes documented in this report are preliminary and upon coder review may  be revised to meet current compliance requirements. Adline Mango" Thompson,  12/30/2021 10:54:19 AM Number of Addenda: 0

## 2021-12-30 NOTE — Evaluation (Signed)
Occupational Therapy Evaluation Patient Details Name: Cheryl Maldonado MRN: [id number] DOB: Oct 19, 1933 Today's Date: 12/30/2021   History of Present Illness Cheryl Maldonado is a 88 y.o. female presents from PCP due to positive FOBT and low hemoglobin. 2/28 colonoscopy and upper GI endoscopy. PMH: seasonal allergies, breast cancer, HTN, hypervitaminosis D, left hip fracture, uterine cancer, R THA Feb 2022   Clinical Impression   Patient is a 88 year old female who was living at home with son and caregiver support prior level. Patient currently is min guard for toileting and standing tasks with RW. Patient is max A for donning socks with patient reporting using sock aid at home. Patient would benefit from continued caregiver support in next level of care at [facility] level. Patient would continue to benefit from skilled OT services at this time while admitted and after d/c to address noted deficits in order to improve overall safety and independence in ADLs.       Recommendations for follow up therapy are one component of a multi-disciplinary discharge planning process, led by the attending physician.  Recommendations may be updated based on patient status, additional functional criteria and insurance authorization.   Follow Up Recommendations  No OT follow up    Assistance Recommended at Discharge Frequent or constant Supervision/Assistance  Patient can return home with the following A little help with walking and/or transfers;A little help with bathing/dressing/bathroom;Assistance with cooking/housework;Direct supervision/assist for financial management;Assist for transportation;Help with stairs or ramp for entrance;Direct supervision/assist for medications management    Functional Status Assessment  Patient has had a recent decline in their functional status and demonstrates the ability to make significant improvements in function in a reasonable and predictable amount of time.  Equipment  Recommendations  None recommended by OT    Recommendations for Other Services       Precautions / Restrictions Precautions Precautions: Fall Restrictions Weight Bearing Restrictions: No      Mobility Bed Mobility Overal bed mobility: Modified Independent             General bed mobility comments: slightly increasead time to mobilize to EOB and return to supine, no assist or cues    Transfers                          Balance Overall balance assessment: Mild deficits observed, not formally tested                                         ADL either performed or assessed with clinical judgement   ADL Overall ADL's : Needs assistance/impaired Eating/Feeding: Set up;Sitting   Grooming: Wash/dry face;Brushing hair;Wash/dry hands;Sitting;Standing;Supervision/safety Grooming Details (indicate cue type and reason): standing to wash hands at sink and brushing hair sitting EOB Upper Body Bathing: Set up;Sitting   Lower Body Bathing: Minimal assistance;Sit to/from stand;Sitting/lateral leans   Upper Body Dressing : Set up;Sitting   Lower Body Dressing: Moderate assistance Lower Body Dressing Details (indicate cue type and reason): patient reported using sock aid to don socks at home Toilet Transfer: Supervision/safety;Rolling walker (2 wheels);Grab bars;Ambulation Toilet Transfer Details (indicate cue type and reason): from bed to commode in bathroom and back with RW Toileting- Clothing Manipulation and Hygiene: Min guard;Sit to/from stand Toileting - Clothing Manipulation Details (indicate cue type and reason): with RW patietn compelted hygiene seated and standing for clothing managemnet  Functional mobility during ADLs: Min guard;Rolling walker (2 wheels)       Vision Patient Visual Report: No change from baseline       Perception     Praxis      Pertinent Vitals/Pain Pain Assessment Pain Assessment: No/denies pain     Hand  Dominance Right   Extremity/Trunk Assessment Upper Extremity Assessment Upper Extremity Assessment: Overall WFL for tasks assessed   Lower Extremity Assessment Lower Extremity Assessment: Defer to PT evaluation   Cervical / Trunk Assessment Cervical / Trunk Assessment: Normal   Communication Communication Communication: No difficulties   Cognition Arousal/Alertness: Awake/alert Behavior During Therapy: WFL for tasks assessed/performed Overall Cognitive Status: No family/caregiver present to determine baseline cognitive functioning                                 General Comments: plesantly confused     General Comments       Exercises     Shoulder Instructions      Home Living Family/patient expects to be discharged to:: Private residence Living Arrangements: Alone Available Help at Discharge: Family;Personal care attendant;Available PRN/intermittently Type of Home: House Home Access: Stairs to enter [employer] of Steps: 4 Entrance Stairs-Rails: Right;Left Home Layout: Able to live on main level with bedroom/bathroom;Two level     Bathroom Shower/Tub: Walk-in shower         Home Equipment: Conservation officer, nature (2 wheels);Cane - single point;Shower seat          Prior Functioning/Environment Prior Level of Function : Needs assist;Independent/Modified Independent             Mobility Comments: pt reports using RW or SPC sometimes but hasn't needed anything until this week due to fatiguing more easily than normal, denies falls ADLs Comments: pt reports ind with bathing, dressing, toileting, aide completes household chores        OT Problem List: Decreased activity tolerance;Impaired balance (sitting and/or standing);Decreased safety awareness;Decreased knowledge of use of DME or AE;Decreased knowledge of precautions      OT Treatment/Interventions: Self-care/ADL training;Therapeutic exercise;Energy conservation;DME and/or AE  instruction;Therapeutic activities;Patient/family education;Balance training    OT Goals(Current goals can be found in the care plan section) Acute Rehab OT Goals Patient Stated Goal: to get home today OT Goal Formulation: With patient Time For Goal Achievement: 01/13/22 Potential to Achieve Goals: Good  OT Frequency: Min 2X/week    Co-evaluation              AM-PAC OT "6 Clicks" Daily Activity     Outcome Measure Help from another person eating meals?: None Help from another person taking care of personal grooming?: None Help from another person toileting, which includes using toliet, bedpan, or urinal?: A Little Help from another person bathing (including washing, rinsing, drying)?: A Little Help from another person to put on and taking off regular upper body clothing?: A Little Help from another person to put on and taking off regular lower body clothing?: A Little 6 Click Score: 20   End of Session Equipment Utilized During Treatment: Rolling walker (2 wheels) Nurse Communication: Mobility status  Activity Tolerance: Patient tolerated treatment well Patient left: in bed;with call bell/phone within reach;with bed alarm set  OT Visit Diagnosis: Unsteadiness on feet (R26.81);Other abnormalities of gait and mobility (R26.89)                Time: 8502-7741 OT Time Calculation (min): 14 min Charges:  OT General Charges $OT Visit: 1 Visit OT Evaluation $OT Eval Low Complexity: 1 Low  Jackelyn Poling OTR/L, MS Acute Rehabilitation Department Office# [phone] Pager# [phone]   Marcellina Millin 12/30/2021, 3:58 PM

## 2021-12-30 NOTE — Anesthesia Procedure Notes (Signed)
Procedure Name: MAC Date/Time: 12/30/2021 9:35 AM Performed by: Eben Burow, CRNA Pre-anesthesia Checklist: Patient identified, Emergency Drugs available, Suction available, Patient being monitored and Timeout performed Oxygen Delivery Method: Simple face mask Placement Confirmation: positive ETCO2

## 2021-12-30 NOTE — Evaluation (Signed)
Physical Therapy Evaluation Patient Details Name: Cheryl Maldonado MRN: [id number] DOB: 11-03-1932 Today's Date: 12/30/2021  History of Present Illness  Cheryl Maldonado is a 88 y.o. female presents from PCP due to positive FOBT and low hemoglobin. 2/28 colonoscopy and upper GI endoscopy. PMH: seasonal allergies, breast cancer, HTN, hypervitaminosis D, left hip fracture, uterine cancer, R THA Feb 2022   Clinical Impression  Pt admitted with above diagnosis. Pt from home with personal care attendant 6 days/week, off on Fridays, able to complete self care tasks and in home ambulation without assistance, caregiver completes household chores and grocery shopping. Pt currently ambulates with RW, steady step through gait pattern, slightly dyspneic 2/4 but able to continue ambulating with conversation. Pt motivated to return home with caregiver and has all DME needs. Pt currently with functional limitations due to the deficits listed below (see PT Problem List). Pt will benefit from skilled PT to increase their independence and safety with mobility to allow discharge to the venue listed below.          Recommendations for follow up therapy are one component of a multi-disciplinary discharge planning process, led by the attending physician.  Recommendations may be updated based on patient status, additional functional criteria and insurance authorization.  Follow Up Recommendations No PT follow up    Assistance Recommended at Discharge Set up Supervision/Assistance  Patient can return home with the following  Assistance with cooking/housework;Assist for transportation    Equipment Recommendations None recommended by PT  Recommendations for Other Services       Functional Status Assessment Patient has had a recent decline in their functional status and demonstrates the ability to make significant improvements in function in a reasonable and predictable amount of time.     Precautions / Restrictions  Precautions Precautions: Fall Restrictions Weight Bearing Restrictions: No      Mobility  Bed Mobility Overal bed mobility: Modified Independent  General bed mobility comments: slightly increasead time to mobilize to EOB and return to supine, no assist or cues    Transfers Overall transfer level: Needs assistance Equipment used: Rolling walker (2 wheels) Transfers: Sit to/from Stand Sit to Stand: Supervision  General transfer comment: powers to stand with BUE assisting    Ambulation/Gait Ambulation/Gait assistance: Supervision Gait Distance (Feet): 200 Feet Assistive device: Rolling walker (2 wheels) Gait Pattern/deviations: Step-through pattern, Decreased stride length, Trunk flexed Gait velocity: decreased  General Gait Details: step through pattern with RW, clears past obstacles and completes turns without LOB, trunk slightly flexed, dyspnea 2/4 but able to continue conversation  Stairs            Wheelchair Mobility    Modified Rankin (Stroke Patients Only)       Balance Overall balance assessment: Mild deficits observed, not formally tested       Pertinent Vitals/Pain Pain Assessment Pain Assessment: No/denies pain    Home Living Family/patient expects to be discharged to:: Private residence Living Arrangements: Alone Available Help at Discharge: Family;Personal care attendant;Available PRN/intermittently (care attendant 9-5 6 days/week (off on Friday)) Type of Home: House Home Access: Stairs to enter Entrance Stairs-Rails: Right;Left Entrance Stairs-Number of Steps: 4   Home Layout: Able to live on main level with bedroom/bathroom;Two level Home Equipment: Conservation officer, nature (2 wheels);Cane - single point;Shower seat      Prior Function Prior Level of Function : Needs assist;Independent/Modified Independent  Mobility Comments: pt reports using RW or SPC sometimes but hasn't needed anything until this week due to fatiguing more easily than  normal,  denies falls ADLs Comments: pt reports ind with bathing, dressing, toileting, aide completes household chores     Hand Dominance        Extremity/Trunk Assessment   Upper Extremity Assessment Upper Extremity Assessment: Defer to OT evaluation    Lower Extremity Assessment Lower Extremity Assessment: Overall WFL for tasks assessed    Cervical / Trunk Assessment Cervical / Trunk Assessment: Normal  Communication   Communication: No difficulties  Cognition Arousal/Alertness: Awake/alert Behavior During Therapy: WFL for tasks assessed/performed Overall Cognitive Status: No family/caregiver present to determine baseline cognitive functioning  General Comments: Pt reports being in [place] at the hospital and date is 12/25/1921- reoriented pt to being in [place] at [place] and date 12/30/2021.        General Comments      Exercises     Assessment/Plan    PT Assessment Patient needs continued PT services  PT Problem List Decreased activity tolerance;Decreased balance       PT Treatment Interventions DME instruction;Gait training;Functional mobility training;Therapeutic activities;Therapeutic exercise;Balance training;Patient/family education    PT Goals (Current goals can be found in the Care Plan section)  Acute Rehab PT Goals Patient Stated Goal: return home PT Goal Formulation: With patient Time For Goal Achievement: 01/13/22 Potential to Achieve Goals: Good    Frequency Min 3X/week     Co-evaluation               AM-PAC PT "6 Clicks" Mobility  Outcome Measure Help needed turning from your back to your side while in a flat bed without using bedrails?: None Help needed moving from lying on your back to sitting on the side of a flat bed without using bedrails?: None Help needed moving to and from a bed to a chair (including a wheelchair)?: A Little Help needed standing up from a chair using your arms (e.g., wheelchair or bedside chair)?: A  Little Help needed to walk in hospital room?: A Little Help needed climbing 3-5 steps with a railing? : A Little 6 Click Score: 20    End of Session Equipment Utilized During Treatment: Gait belt Activity Tolerance: Patient tolerated treatment well Patient left: in bed;with call bell/phone within reach;with bed alarm set Nurse Communication: Mobility status PT Visit Diagnosis: Other abnormalities of gait and mobility (R26.89)    Time: 3710-6269 PT Time Calculation (min) (ACUTE ONLY): 21 min   Charges:   PT Evaluation $PT Eval Low Complexity: 1 Low           Tori Holt Woolbright PT, DPT 12/30/21, 3:11 PM

## 2021-12-30 NOTE — Transfer of Care (Signed)
Immediate Anesthesia Transfer of Care Note  Patient: Cheryl Maldonado  Procedure(s) Performed: ESOPHAGOGASTRODUODENOSCOPY (EGD) WITH PROPOFOL COLONOSCOPY WITH PROPOFOL BIOPSY POLYPECTOMY  Patient Location: PACU  Anesthesia Type:MAC  Level of Consciousness: awake  Airway & Oxygen Therapy: Patient Spontanous Breathing and Patient connected to face mask oxygen  Post-op Assessment: Report given to RN and Post -op Vital signs reviewed and stable  Post vital signs: Reviewed and stable  Last Vitals:  Vitals Value Taken Time  BP    Temp    Pulse    Resp    SpO2      Last Pain:  Vitals:   12/30/21 0825  TempSrc: Oral  PainSc: 0-No pain         Complications: No notable events documented.

## 2021-12-31 ENCOUNTER — Encounter (HOSPITAL_COMMUNITY): Payer: Self-pay | Admitting: Internal Medicine

## 2021-12-31 ENCOUNTER — Encounter: Payer: Self-pay | Admitting: Internal Medicine

## 2021-12-31 DIAGNOSIS — K257 Chronic gastric ulcer without hemorrhage or perforation: Secondary | ICD-10-CM

## 2021-12-31 LAB — SURGICAL PATHOLOGY

## 2022-01-01 MED ORDER — PANTOPRAZOLE SODIUM 40 MG PO TBEC: 40.0000 mg | DELAYED_RELEASE_TABLET | Freq: Two times a day (BID) | ORAL | 0 refills | Status: DC

## 2022-01-01 MED ORDER — PANTOPRAZOLE SODIUM 40 MG PO TBEC: 40.0000 mg | DELAYED_RELEASE_TABLET | Freq: Every day | ORAL | 3 refills | Status: DC

## 2022-01-01 NOTE — Progress Notes (Addendum)
Pantoprazole updated to reflect Dr. Libby Maw orders. 12 week BID dosing sent to pt preferred pharmacy. Daily dosing written to be filled later. ?

## 2022-01-01 NOTE — Addendum Note (Signed)
Addended by: Hardie Pulley, Robina Hamor J on: 01/01/2022 08:20 AM ? ? Modules accepted: Orders ? ?

## 2022-01-05 DIAGNOSIS — R531 Weakness: Secondary | ICD-10-CM | POA: Diagnosis not present

## 2022-01-05 DIAGNOSIS — R195 Other fecal abnormalities: Secondary | ICD-10-CM | POA: Diagnosis not present

## 2022-01-05 DIAGNOSIS — D509 Iron deficiency anemia, unspecified: Secondary | ICD-10-CM | POA: Diagnosis not present

## 2022-01-08 ENCOUNTER — Inpatient Hospital Stay: Payer: Medicare PPO | Attending: Oncology | Admitting: Oncology

## 2022-01-08 ENCOUNTER — Other Ambulatory Visit: Payer: Self-pay

## 2022-01-08 VITALS — BP 142/60 | HR 109 | Temp 97.5°F | Resp 18 | Wt 105.2 lb

## 2022-01-08 DIAGNOSIS — E672 Megavitamin-B6 syndrome: Secondary | ICD-10-CM | POA: Insufficient documentation

## 2022-01-08 DIAGNOSIS — Z853 Personal history of malignant neoplasm of breast: Secondary | ICD-10-CM | POA: Insufficient documentation

## 2022-01-08 DIAGNOSIS — Z90710 Acquired absence of both cervix and uterus: Secondary | ICD-10-CM | POA: Insufficient documentation

## 2022-01-08 DIAGNOSIS — E039 Hypothyroidism, unspecified: Secondary | ICD-10-CM | POA: Insufficient documentation

## 2022-01-08 DIAGNOSIS — D500 Iron deficiency anemia secondary to blood loss (chronic): Secondary | ICD-10-CM | POA: Insufficient documentation

## 2022-01-08 DIAGNOSIS — Z8542 Personal history of malignant neoplasm of other parts of uterus: Secondary | ICD-10-CM | POA: Insufficient documentation

## 2022-01-08 DIAGNOSIS — Z79899 Other long term (current) drug therapy: Secondary | ICD-10-CM | POA: Insufficient documentation

## 2022-01-08 DIAGNOSIS — Z8781 Personal history of (healed) traumatic fracture: Secondary | ICD-10-CM | POA: Insufficient documentation

## 2022-01-08 DIAGNOSIS — E785 Hyperlipidemia, unspecified: Secondary | ICD-10-CM | POA: Diagnosis not present

## 2022-01-08 DIAGNOSIS — I10 Essential (primary) hypertension: Secondary | ICD-10-CM | POA: Diagnosis not present

## 2022-01-08 DIAGNOSIS — K209 Esophagitis, unspecified without bleeding: Secondary | ICD-10-CM | POA: Diagnosis not present

## 2022-01-08 NOTE — Progress Notes (Signed)
?Reason for the request:    Anemia ? ?HPI: I was asked by Dr. Theda Sers to evaluate Cheryl Maldonado for anemia.  She is an 88 year old woman hospitalized in February 2023 after presenting with a hemoglobin of 6.8 and positive fecal occult testing.  At that time laboratory testing showed iron level of 18.  She received 1 unit of packed red cell transfusion and underwent endoscopy and colonoscopy.  She was found to have esophagitis and a nonbleeding gastric ulcer.  She was discharged on oral iron therapy.  Her hemoglobin was up to 9.0 on December 30, 2021.  Clinically, she reports feeling reasonably well without any hematochezia, melena or hemoptysis.  She denies any complications related to oral iron replacement.  She denies any shortness of breath or dyspnea on exertion.  She denies any excessive fatigue or tiredness.  She is ambulating with the help of a cane. ? ?She does not report any headaches, blurry vision, syncope or seizures. Does not report any fevers, chills or sweats.  Does not report any cough, wheezing or hemoptysis.  Does not report any chest pain, palpitation, orthopnea or leg edema.  Does not report any nausea, vomiting or abdominal pain.  Does not report any constipation or diarrhea.  Does not report any skeletal complaints.    Does not report frequency, urgency or hematuria.  Does not report any skin rashes or lesions. Does not report any heat or cold intolerance.  Does not report any lymphadenopathy or petechiae.  Does not report any anxiety or depression.  Remaining review of systems is negative.  ? ? ? ?Past Medical History:  ?Diagnosis Date  ? Breast cancer ([place])   ? Hyperlipidemia 12/29/2021  ? Hypertension   ? Hypervitaminosis D 12/29/2021  ? Hypothyroidism 12/29/2021  ? Intertrochanteric fracture of left hip ([place]) 04/11/2019  ? Iron deficiency anemia due to chronic blood loss 12/29/2021  ? Uterine cancer ([place])   ?: ? ? ?Past Surgical History:  ?Procedure Laterality Date  ? ABDOMINAL HYSTERECTOMY    ?  BIOPSY  12/30/2021  ? Procedure: BIOPSY;  Surgeon: Sharyn Creamer, MD;  Location: [facility];  Service: Gastroenterology;;  ? COLONOSCOPY    ? COLONOSCOPY WITH PROPOFOL N/A 12/30/2021  ? Procedure: COLONOSCOPY WITH PROPOFOL;  Surgeon: Sharyn Creamer, MD;  Location: [facility];  Service: Gastroenterology;  Laterality: N/A;  ? ESOPHAGOGASTRODUODENOSCOPY (EGD) WITH PROPOFOL N/A 12/30/2021  ? Procedure: ESOPHAGOGASTRODUODENOSCOPY (EGD) WITH PROPOFOL;  Surgeon: Sharyn Creamer, MD;  Location: [facility];  Service: Gastroenterology;  Laterality: N/A;  ? FEMUR IM NAIL Left 04/13/2019  ? Procedure: INTRAMEDULLARY (IM) NAIL FEMORAL;  Surgeon: Rod Can, MD;  Location: [facility];  Service: Orthopedics;  Laterality: Left;  ? POLYPECTOMY  12/30/2021  ? Procedure: POLYPECTOMY;  Surgeon: Sharyn Creamer, MD;  Location: [facility];  Service: Gastroenterology;;  ? TOTAL HIP ARTHROPLASTY Right 12/06/2020  ? Procedure: TOTAL HIP ARTHROPLASTY ANTERIOR APPROACH;  Surgeon: Rod Can, MD;  Location: [facility];  Service: Orthopedics;  Laterality: Right;  ?: ? ? ?Current Outpatient Medications:  ?  acetaminophen (TYLENOL) 325 MG tablet, Take 1-2 tablets (325-650 mg total) by mouth every 6 (six) hours as needed for mild pain (pain score 1-3 or temp > 100.5)., Disp: 60 tablet, Rfl: 0 ?  amLODipine (NORVASC) 5 MG tablet, Take 5 mg by mouth daily., Disp: , Rfl:  ?  levothyroxine (SYNTHROID, LEVOTHROID) 75 MCG tablet, Take 75 mcg by mouth daily before breakfast., Disp: , Rfl:  ?  menthol-cetylpyridinium (CEPACOL) 3  MG lozenge, Take 1 lozenge (3 mg total) by mouth as needed for sore throat., Disp: 100 tablet, Rfl: 12 ?  oxymetazoline (AFRIN) 0.05 % nasal spray, Place 1 spray into both nostrils daily as needed for congestion., Disp: , Rfl:  ?  pantoprazole (PROTONIX) 40 MG tablet, Take 1 tablet (40 mg total) by mouth 2 (two) times daily., Disp: 168 tablet, Rfl: 0 ?  [START ON 03/26/2022] pantoprazole (PROTONIX) 40 MG tablet, Take 1 tablet (40  mg total) by mouth daily., Disp: 90 tablet, Rfl: 3 ?  Pediatric Multivitamins-Iron Vicki Mallet W/IRON PO), Take 1 tablet by mouth daily., Disp: , Rfl:  ?  raloxifene (EVISTA) 60 MG tablet, Take 60 mg by mouth daily., Disp: , Rfl:  ?  simvastatin (ZOCOR) 5 MG tablet, Take 5 mg by mouth every other day., Disp: , Rfl:  ?  sucralfate (CARAFATE) 1 GM/10ML suspension, Take 10 mLs (1 g total) by mouth 4 (four) times daily., Disp: 1200 mL, Rfl: 0 ?  telmisartan (MICARDIS) 80 MG tablet, Take 80 mg by mouth daily., Disp: , Rfl: : ? ?No Known Allergies: ? ? ?Family History  ?Problem Relation Age of Onset  ? Cancer Mother   ?: ? ? ?Social History  ? ?Socioeconomic History  ? Marital status: Married  ?  Spouse name: Not on file  ? Number of children: 2  ? Years of education: Not on file  ? Highest education level: Not on file  ?Occupational History  ? Occupation: retired  ?Tobacco Use  ? Smoking status: Never  ? Smokeless tobacco: Never  ?Vaping Use  ? Vaping Use: Never used  ?Substance and Sexual Activity  ? Alcohol use: No  ?  Alcohol/week: 0.0 standard drinks  ? Drug use: Never  ? Sexual activity: Not on file  ?Other Topics Concern  ? Not on file  ?Social History Narrative  ? Not on file  ? ?Social Determinants of Health  ? ?Financial Resource Strain: Not on file  ?Food Insecurity: Not on file  ?Transportation Needs: Not on file  ?Physical Activity: Not on file  ?Stress: Not on file  ?Social Connections: Not on file  ?Intimate Partner Violence: Not on file  ?: ? ?Pertinent items are noted in HPI. ? ?Exam: ? ?General appearance: alert and cooperative appeared without distress. ?Head: atraumatic without any abnormalities. ?Eyes: conjunctivae/corneas clear. PERRL.  Sclera anicteric. ?Throat: lips, mucosa, and tongue normal; without oral thrush or ulcers. ?Resp: clear to auscultation bilaterally without rhonchi, wheezes or dullness to percussion. ?Cardio: regular rate and rhythm, S1, S2 normal, no murmur, click, rub or  gallop ?GI: soft, non-tender; bowel sounds normal; no masses,  no organomegaly ?Skin: Skin color, texture, turgor normal. No rashes or lesions ?Lymph nodes: Cervical, supraclavicular, and axillary nodes normal. ?Neurologic: Grossly normal without any motor, sensory or deep tendon reflexes. ?Musculoskeletal: No joint deformity or effusion. ? ? ? ?Assessment and Plan:  ? ?88 year old with: ? ?1.  Iron deficiency anemia related to GI blood loss as documented in February 2023.  She presented with hemoglobin of 6.8 and iron level of 18.  Saturation level was 5% and ferritin of 25. ? ?The natural course of this disease was reviewed at this time and treatment options were discussed.  She is currently on oral iron replacement therapy without any issues.  Alternative treatment options including intravenous iron infusion were reviewed.  Risks and benefits of this approach were discussed.  Complications that include arthralgias, myalgias and anaphylaxis were reiterated.  At this  time I recommended continuing oral iron replacement given the mild deficiency as well as her reasonable tolerance to oral iron.  Plan to recheck her iron level in the next 2 months if she is continue to be deficient we will consider IV iron at that time. ? ? ?2.  Follow-up: Will be in 2 months for repeat follow-up. ? ? ?45  minutes were dedicated to this visit. The time was spent on reviewing laboratory data, discussing treatment options, discussing differential diagnosis and answering questions regarding future plan. ? ? ? ? A copy of this consult has been forwarded to the requesting physician. ? ?

## 2022-01-26 ENCOUNTER — Encounter: Payer: Self-pay | Admitting: Gastroenterology

## 2022-01-26 ENCOUNTER — Ambulatory Visit: Payer: Medicare PPO | Admitting: Gastroenterology

## 2022-01-26 DIAGNOSIS — K257 Chronic gastric ulcer without hemorrhage or perforation: Secondary | ICD-10-CM

## 2022-01-26 MED ORDER — PANTOPRAZOLE SODIUM 40 MG PO TBEC: 40.0000 mg | DELAYED_RELEASE_TABLET | Freq: Two times a day (BID) | ORAL | 0 refills | Status: DC

## 2022-01-26 NOTE — Progress Notes (Signed)
Review of pertinent gastrointestinal problems: ?1. Adenomatous polyps: 64m TA without HGD removed during colonoscopy 2011 Dr. JArdis Hughs done for routine screening.  Office visit at age 88 good discussion about surveillance, decided to not pursue further surveillance colonoscopies. ?2.  Admitted to hospital with symptomatic iron deficiency anemia, melena.  12/2021 EGD found large hiatal hernia, severe esophagitis, 2 gastric ulcers in the proximal stomach, Barrett's appearing mucosa.  Pathology showed no H. pylori, no dysplasia.,  Normal duodenum.  Colonoscopy 12/2021 found 3 subcentimeter precancerous colon polyps, diverticulosis.  She underwent 1 unit blood transfusion, I believe also iron infusion.  She was told to be on twice daily proton pump inhibitor and follow-up GI appointment was scheduled.  Her discharge hemoglobin was 9.0 December 30, 2021. ? ? ?HPI: ?This is a very pleasant 88year old woman who is here with a longtime caregiver who is with her 6 days/week.  She seems pleasantly a bit demented ? ?Blood work from her primary care physician's office dated December 19, 2018 through the iron 13, TSH 1.2, hemoglobin 8.1, MCV 102, platelets 384, complete metabolic profile overall essentially normal, these labs were all drawn before her colonoscopy and EGD above ? ?Since discharge from the hospital about 1 month ago she has had no recurrent melena.  She is having brown stools.  She is feeling well.  No abdominal pains.  She met with hematology after her hospital stay, she was not given further IV iron but she has been on a twice a day multivitamin with extra iron and is eating an iron rich diet.  She is taking proton pump inhibitor Protonix 2 pills every morning.  She is also on Carafate 4 times daily. ? ? ?ROS: complete GI ROS as described in HPI, all other review negative. ? ?Constitutional:  No unintentional weight loss ? ? ?Past Medical History:  ?Diagnosis Date  ? [place] cancer ([place]   ? Hyperlipidemia  12/29/2021  ? Hypertension   ? Hypervitaminosis D 12/29/2021  ? Hypothyroidism 12/29/2021  ? Intertrochanteric fracture of left hip ([place] 04/11/2019  ? Iron deficiency anemia due to chronic blood loss 12/29/2021  ? [place]   ? ? ?Past Surgical History:  ?Procedure Laterality Date  ? ABDOMINAL HYSTERECTOMY    ? BIOPSY  12/30/2021  ? Procedure: BIOPSY;  Surgeon: DSharyn Creamer MD;  [facility];  Service: Gastroenterology;;  ? COLONOSCOPY    ? COLONOSCOPY WITH PROPOFOL N/A 12/30/2021  ? Procedure: COLONOSCOPY WITH PROPOFOL;  Surgeon: DSharyn Creamer MD;  [facility];  Service: Gastroenterology;  Laterality: N/A;  ? ESOPHAGOGASTRODUODENOSCOPY (EGD) WITH PROPOFOL N/A 12/30/2021  ? Procedure: ESOPHAGOGASTRODUODENOSCOPY (EGD) WITH PROPOFOL;  Surgeon: DSharyn Creamer MD;  [facility];  Service: Gastroenterology;  Laterality: N/A;  ? FEMUR IM NAIL Left 04/13/2019  ? Procedure: INTRAMEDULLARY (IM) NAIL FEMORAL;  Surgeon: SRod Can MD;  [facility];  Service: Orthopedics;  Laterality: Left;  ? POLYPECTOMY  12/30/2021  ? Procedure: POLYPECTOMY;  Surgeon: DSharyn Creamer MD;  [facility];  Service: Gastroenterology;;  ? TOTAL HIP ARTHROPLASTY Right 12/06/2020  ? Procedure: TOTAL HIP ARTHROPLASTY ANTERIOR APPROACH;  Surgeon: SRod Can MD;  [facility];  Service: Orthopedics;  Laterality: Right;  ? ? ?Current Outpatient Medications  ?Medication Instructions  ? acetaminophen (TYLENOL) 325-650 mg, Oral, Every 6 hours PRN  ? amLODipine (NORVASC) 5 mg, Oral, Daily  ? levothyroxine (SYNTHROID) 75 mcg, Oral, Daily before breakfast  ? menthol-cetylpyridinium (CEPACOL) 3 mg, Oral, As needed  ?  oxymetazoline (AFRIN) 0.05 % nasal spray 1 spray, Each Nare, Daily PRN  ? pantoprazole (PROTONIX) 40 mg, Oral, 2 times daily  ? Pediatric Multivitamins-Iron Vicki Mallet W/IRON PO) 2 tablets, Oral, Daily  ? raloxifene (EVISTA) 60 mg, Oral, Daily  ? simvastatin (ZOCOR) 5 mg, Oral,  Every other day  ? sucralfate (CARAFATE) 1 g, Oral, 4 times daily  ? telmisartan (MICARDIS) 80 mg, Oral, Daily  ? ? ?Allergies as of 01/26/2022  ? (No Known Allergies)  ? ? ?Family History  ?Problem Relation Age of Onset  ? Cancer Mother   ? Stomach cancer Neg Hx   ? Esophageal cancer Neg Hx   ? Colon cancer Neg Hx   ? Pancreatic cancer Neg Hx   ? ? ?Social History  ? ?Socioeconomic History  ? Marital status: Married  ?  Spouse name: Not on file  ? Number of children: 2  ? Years of education: Not on file  ? Highest education level: Not on file  ?Occupational History  ? Occupation: retired  ?Tobacco Use  ? Smoking status: Never  ? Smokeless tobacco: Never  ?Vaping Use  ? Vaping Use: Never used  ?Substance and Sexual Activity  ? Alcohol use: No  ?  Alcohol/week: 0.0 standard drinks  ? Drug use: Never  ? Sexual activity: Not on file  ?Other Topics Concern  ? Not on file  ?Social History Narrative  ? Not on file  ? ?Social Determinants of Health  ? ?Financial Resource Strain: Not on file  ?Food Insecurity: Not on file  ?Transportation Needs: Not on file  ?Physical Activity: Not on file  ?Stress: Not on file  ?Social Connections: Not on file  ?Intimate Partner Violence: Not on file  ? ? ? ?Physical Exam: ?BP 118/68   Pulse 68   Ht '5\' 2"'  (1.575 m)   Wt 84 lb (38.1 kg)   BMI 15.36 kg/m?  ?Constitutional: Elderly, frail but well kept, seems a bit confused about history however ?Psychiatric: alert and oriented x3 ?Abdomen: soft, nontender, nondistended, no obvious ascites, no peritoneal signs, normal bowel sounds ?No peripheral edema noted in lower extremities ? ?Assessment and plan: ?88 y.o. female with iron deficiency anemia from gastric ulcers recently discovered by EGD last month ? ?Unclear etiology.  She was however taking a baby aspirin every day and so certainly that could have caused these ulcers.  She was H. pylori negative on biopsy.  The bleeding is clearly stopped.  I recommended that her Protonix be taken  twice daily 40 mg rather than 80 mg once daily.  She understands that she needs a repeat upper endoscopy in about 4 or 5 weeks from now and that would be safe to be done in our outpatient endoscopy center.  She can stop the Carafate.  She will have a CBC in 3 or 4 weeks as well. ? ?Please see the "Patient Instructions" section for addition details about the plan. ? ?Owens Loffler, MD ?[facility] ?01/26/2022, 10:09 AM ? ? ?Total time on date of encounter was 30 minutes (this included time spent preparing to see the patient reviewing records; obtaining and/or reviewing separately obtained history; performing a medically appropriate exam and/or evaluation; counseling and educating the patient and family if present; ordering medications, tests or procedures if applicable; and documenting clinical information in the health record). ? ?

## 2022-01-26 NOTE — Patient Instructions (Addendum)
If you are age 86 or older, your body mass index should be between 23-30. Your Body mass index is 15.36 kg/m?Marland Kitchen If this is out of the aforementioned range listed, please consider follow up with your Primary Care Provider. ?________________________________________________________ ? ?The [place] [facility] providers would like to encourage you to use [facility] to communicate with providers for non-urgent requests or questions.  Due to long hold times on the telephone, sending your provider a message by [facility] may be a faster and more efficient way to get a response.  Please allow 48 business hours for a response.  Please remember that this is for non-urgent requests.  ?_______________________________________________________ ? ?DISCONTINUE: Carafate ? ?CHANGE: pantoprazole to one tablet in the morning shortly before breakfast meal and one tablet in the evening shortly before dinner meal. ? ?You have been scheduled for an endoscopy. Please follow written instructions given to you at your visit today. ?If you use inhalers (even only as needed), please bring them with you on the day of your procedure. ? ?Your provider has requested that you go to the basement level for lab work in 4 weeks (02-23-22). Press "B" on the elevator. The lab is located at the first door on the left as you exit the elevator. ? ? ?Due to recent changes in healthcare laws, you may see the results of your imaging and laboratory studies on MyChart before your provider has had a chance to review them.  We understand that in some cases there may be results that are confusing or concerning to you. Not all laboratory results come back in the same time frame and the provider may be waiting for multiple results in order to interpret others.  Please give [place] 48 hours in order for your provider to thoroughly review all the results before contacting the office for clarification of your results.  ? ?Thank you for entrusting me with your care and choosing [facility]. ? ?Dr Ardis Hughs ? ?

## 2022-01-26 NOTE — Addendum Note (Signed)
Addended by: Stevan Born on: 01/26/2022 10:55 AM ? ? Modules accepted: Orders ? ?

## 2022-01-29 DIAGNOSIS — K209 Esophagitis, unspecified without bleeding: Secondary | ICD-10-CM | POA: Diagnosis not present

## 2022-01-29 DIAGNOSIS — Z09 Encounter for follow-up examination after completed treatment for conditions other than malignant neoplasm: Secondary | ICD-10-CM | POA: Diagnosis not present

## 2022-01-29 DIAGNOSIS — K250 Acute gastric ulcer with hemorrhage: Secondary | ICD-10-CM | POA: Diagnosis not present

## 2022-01-29 DIAGNOSIS — D500 Iron deficiency anemia secondary to blood loss (chronic): Secondary | ICD-10-CM | POA: Diagnosis not present

## 2022-01-29 DIAGNOSIS — K449 Diaphragmatic hernia without obstruction or gangrene: Secondary | ICD-10-CM | POA: Diagnosis not present

## 2022-02-23 ENCOUNTER — Other Ambulatory Visit (INDEPENDENT_AMBULATORY_CARE_PROVIDER_SITE_OTHER): Payer: Medicare PPO

## 2022-02-23 DIAGNOSIS — K257 Chronic gastric ulcer without hemorrhage or perforation: Secondary | ICD-10-CM | POA: Diagnosis not present

## 2022-02-23 LAB — CBC
HCT: 38.7 % (ref 36.0–46.0)
Hemoglobin: 12.6 g/dL (ref 12.0–15.0)
MCHC: 32.5 g/dL (ref 30.0–36.0)
MCV: 91.8 fl (ref 78.0–100.0)
Platelets: 274 10*3/uL (ref 150.0–400.0)
RBC: 4.22 Mil/uL (ref 3.87–5.11)
RDW: 14.9 % (ref 11.5–15.5)
WBC: 7.5 10*3/uL (ref 4.0–10.5)

## 2022-03-03 ENCOUNTER — Encounter: Payer: Self-pay | Admitting: Gastroenterology

## 2022-03-03 ENCOUNTER — Ambulatory Visit (AMBULATORY_SURGERY_CENTER): Payer: Medicare PPO | Admitting: Gastroenterology

## 2022-03-03 VITALS — BP 101/44 | HR 56 | Temp 98.2°F | Resp 15 | Ht 62.0 in | Wt 84.0 lb

## 2022-03-03 DIAGNOSIS — K259 Gastric ulcer, unspecified as acute or chronic, without hemorrhage or perforation: Secondary | ICD-10-CM | POA: Diagnosis not present

## 2022-03-03 DIAGNOSIS — K257 Chronic gastric ulcer without hemorrhage or perforation: Secondary | ICD-10-CM | POA: Diagnosis not present

## 2022-03-03 MED ORDER — SODIUM CHLORIDE 0.9 % IV SOLN: 500.0000 mL | Freq: Once | INTRAVENOUS | Status: DC

## 2022-03-03 NOTE — Progress Notes (Signed)
Admitted to hospital with symptomatic iron deficiency anemia, melena.  12/2021 EGD found large hiatal hernia, severe esophagitis, 2 gastric ulcers in the proximal stomach, Barrett's appearing mucosa.  Pathology showed no H. pylori, no dysplasia.,  Normal duodenum.   ? ? ?HPI: ?This is a woman with 12/2021 gastric ulcers, melena ? ? ?ROS: complete GI ROS as described in HPI, all other review negative. ? ?Constitutional:  No unintentional weight loss ? ? ?Past Medical History:  ?Diagnosis Date  ? Breast cancer ([place])   ? Hyperlipidemia 12/29/2021  ? Hypertension   ? Hypervitaminosis D 12/29/2021  ? Hypothyroidism 12/29/2021  ? Intertrochanteric fracture of left hip ([place]) 04/11/2019  ? Iron deficiency anemia due to chronic blood loss 12/29/2021  ? Uterine cancer ([place])   ? ? ?Past Surgical History:  ?Procedure Laterality Date  ? ABDOMINAL HYSTERECTOMY    ? BIOPSY  12/30/2021  ? Procedure: BIOPSY;  Surgeon: Sharyn Creamer, MD;  [facility];  Service: Gastroenterology;;  ? COLONOSCOPY    ? COLONOSCOPY WITH PROPOFOL N/A 12/30/2021  ? Procedure: COLONOSCOPY WITH PROPOFOL;  Surgeon: Sharyn Creamer, MD;  [facility];  Service: Gastroenterology;  Laterality: N/A;  ? ESOPHAGOGASTRODUODENOSCOPY (EGD) WITH PROPOFOL N/A 12/30/2021  ? Procedure: ESOPHAGOGASTRODUODENOSCOPY (EGD) WITH PROPOFOL;  Surgeon: Sharyn Creamer, MD;  [facility];  Service: Gastroenterology;  Laterality: N/A;  ? FEMUR IM NAIL Left 04/13/2019  ? Procedure: INTRAMEDULLARY (IM) NAIL FEMORAL;  Surgeon: Rod Can, MD;  [facility];  Service: Orthopedics;  Laterality: Left;  ? POLYPECTOMY  12/30/2021  ? Procedure: POLYPECTOMY;  Surgeon: Sharyn Creamer, MD;  [facility];  Service: Gastroenterology;;  ? TOTAL HIP ARTHROPLASTY Right 12/06/2020  ? Procedure: TOTAL HIP ARTHROPLASTY ANTERIOR APPROACH;  Surgeon: Rod Can, MD;  [facility];  Service: Orthopedics;  Laterality: Right;  ? ? ?Current Outpatient Medications   ?Medication Sig Dispense Refill  ? acetaminophen (TYLENOL) 325 MG tablet Take 1-2 tablets (325-650 mg total) by mouth every 6 (six) hours as needed for mild pain (pain score 1-3 or temp > 100.5). 60 tablet 0  ? amLODipine (NORVASC) 5 MG tablet Take 5 mg by mouth daily.    ? levothyroxine (SYNTHROID, LEVOTHROID) 75 MCG tablet Take 75 mcg by mouth daily before breakfast.    ? menthol-cetylpyridinium (CEPACOL) 3 MG lozenge Take 1 lozenge (3 mg total) by mouth as needed for sore throat. 100 tablet 12  ? oxymetazoline (AFRIN) 0.05 % nasal spray Place 1 spray into both nostrils daily as needed for congestion.    ? pantoprazole (PROTONIX) 40 MG tablet Take 1 tablet (40 mg total) by mouth 2 (two) times daily before a meal. 168 tablet 0  ? Pediatric Multivitamins-Iron Vicki Mallet W/IRON PO) Take 2 tablets by mouth daily.    ? raloxifene (EVISTA) 60 MG tablet Take 60 mg by mouth daily.    ? simvastatin (ZOCOR) 5 MG tablet Take 5 mg by mouth every other day.    ? telmisartan (MICARDIS) 80 MG tablet Take 80 mg by mouth daily.    ? ?Current Facility-Administered Medications  ?Medication Dose Route Frequency Provider Last Rate Last Admin  ? 0.9 %  sodium chloride infusion  500 mL Intravenous Once Milus Banister, MD      ? ? ?Allergies as of 03/03/2022  ? (No Known Allergies)  ? ? ?Family History  ?Problem Relation Age of Onset  ? Cancer Mother   ? Stomach cancer Neg Hx   ? Esophageal cancer  Neg Hx   ? Colon cancer Neg Hx   ? Pancreatic cancer Neg Hx   ? ? ?Social History  ? ?Socioeconomic History  ? Marital status: Married  ?  Spouse name: Not on file  ? Number of children: 2  ? Years of education: Not on file  ? Highest education level: Not on file  ?Occupational History  ? Occupation: retired  ?Tobacco Use  ? Smoking status: Never  ? Smokeless tobacco: Never  ?Vaping Use  ? Vaping Use: Never used  ?Substance and Sexual Activity  ? Alcohol use: No  ?  Alcohol/week: 0.0 standard drinks  ? Drug use: Never  ? Sexual activity:  Not on file  ?Other Topics Concern  ? Not on file  ?Social History Narrative  ? Not on file  ? ?Social Determinants of Health  ? ?Financial Resource Strain: Not on file  ?Food Insecurity: Not on file  ?Transportation Needs: Not on file  ?Physical Activity: Not on file  ?Stress: Not on file  ?Social Connections: Not on file  ?Intimate Partner Violence: Not on file  ? ? ? ?Physical Exam: ?BP (!) 163/69   Pulse 88   Temp 98.2 ?F (36.8 ?C)   Ht '5\' 2"'$  (1.575 m)   Wt 84 lb (38.1 kg)   SpO2 99%   BMI 15.36 kg/m?  ?Constitutional: generally well-appearing ?Psychiatric: alert and oriented x3 ?Lungs: CTA bilaterally ?Heart: no MCR ? ?Assessment and plan: ?88 y.o. female with h/o gastric ulcers ? ? ?For repeat egd today, check for ulcer healing ? ?Care is appropriate for the ambulatory setting. ? ?Owens Loffler, MD ?[facility]enter[facility] ?03/03/2022, 10:51 AM ? ? ? ?

## 2022-03-03 NOTE — Patient Instructions (Signed)
Resume previous diet and continue present medications.  ?Follow up as needed. No further surveillance needed. ? ? ?YOU HAD AN ENDOSCOPIC PROCEDURE TODAY AT [facility]:   Refer to the procedure report that was given to you for any specific questions about what was found during the examination.  If the procedure report does not answer your questions, please call your gastroenterologist to clarify.  If you requested that your care partner not be given the details of your procedure findings, then the procedure report has been included in a sealed envelope for you to review at your convenience later. ? ?YOU SHOULD EXPECT: Some feelings of bloating in the abdomen. Passage of more gas than usual.  Walking can help get rid of the air that was put into your GI tract during the procedure and reduce the bloating. If you had a lower endoscopy (such as a colonoscopy or flexible sigmoidoscopy) you may notice spotting of blood in your stool or on the toilet paper. If you underwent a bowel prep for your procedure, you may not have a normal bowel movement for a few days. ? ?Please Note:  You might notice some irritation and congestion in your nose or some drainage.  This is from the oxygen used during your procedure.  There is no need for concern and it should clear up in a day or so. ? ?SYMPTOMS TO REPORT IMMEDIATELY: ? ?Following upper endoscopy (EGD) ? Vomiting of blood or coffee ground material ? New chest pain or pain under the shoulder blades ? Painful or persistently difficult swallowing ? New shortness of breath ? Fever of 100?F or higher ? Black, tarry-looking stools ? ?For urgent or emergent issues, a gastroenterologist can be reached at any hour by calling [phone]. ?Do not use MyChart messaging for urgent concerns.  ? ? ?DIET:  We do recommend a small meal at first, but then you may proceed to your regular diet.  Drink plenty of fluids but you should avoid alcoholic beverages for 24  hours. ? ?ACTIVITY:  You should plan to take it easy for the rest of today and you should NOT DRIVE or use heavy machinery until tomorrow (because of the sedation medicines used during the test).   ? ?FOLLOW UP: ?Our staff will call the number listed on your records 48-72 hours following your procedure to check on you and address any questions or concerns that you may have regarding the information given to you following your procedure. If we do not reach you, we will leave a message.  We will attempt to reach you two times.  During this call, we will ask if you have developed any symptoms of COVID 19. If you develop any symptoms (ie: fever, flu-like symptoms, shortness of breath, cough etc.) before then, please call [phone].  If you test positive for Covid 19 in the 2 weeks post procedure, please call and report this information to [place].   ? ?If any biopsies were taken you will be contacted by phone or by letter within the next 1-3 weeks.  Please call us at [phone] if you have not heard about the biopsies in 3 weeks.  ? ? ?SIGNATURES/CONFIDENTIALITY: ?You and/or your care partner have signed paperwork which will be entered into your electronic medical record.  These signatures attest to the fact that that the information above on your After Visit Summary has been reviewed and is understood.  Full responsibility of the confidentiality of this discharge information lies with you  and/or your care-partner.  ?

## 2022-03-03 NOTE — Op Note (Signed)
[place] ?Patient Name: Cheryl Maldonado ?Procedure Date: 03/03/2022 11:07 AM ?MRN: [id number] ?Endoscopist: Milus Banister , MD ?Age: 88 ?Referring MD:  ?Date of Birth: [birth year] ?Gender: Female ?Account #: [id number] ?Procedure:                Upper GI endoscopy ?Indications:              Gastric ulcers: Admitted to hospital with  ?                          symptomatic iron deficiency anemia, melena. 12/2021  ?                          EGD found large hiatal hernia, severe esophagitis,  ?                          2 gastric ulcers in the proximal stomach, Barrett's  ?                          appearing mucosa. Pathology showed no H. pylori, no  ?                          dysplasia., Normal duodenum. ?Medicines:                Monitored Anesthesia Care ?Procedure:                Pre-Anesthesia Assessment: ?                          - Prior to the procedure, a History and Physical  ?                          was performed, and patient medications and  ?                          allergies were reviewed. The patient's tolerance of  ?                          previous anesthesia was also reviewed. The risks  ?                          and benefits of the procedure and the sedation  ?                          options and risks were discussed with the patient.  ?                          All questions were answered, and informed consent  ?                          was obtained. Prior Anticoagulants: The patient has  ?                          taken no previous anticoagulant or antiplatelet  ?  agents. ASA Grade Assessment: III - A patient with  ?                          severe systemic disease. After reviewing the risks  ?                          and benefits, the patient was deemed in  ?                          satisfactory condition to undergo the procedure. ?                          After obtaining informed consent, the endoscope was  ?                          passed under direct  vision. Throughout the  ?                          procedure, the patient's blood pressure, pulse, and  ?                          oxygen saturations were monitored continuously. The  ?                          Endoscope was introduced through the mouth, and  ?                          advanced to the second part of duodenum. The upper  ?                          GI endoscopy was accomplished without difficulty.  ?                          The patient tolerated the procedure well. ?Scope In: ?Scope Out: ?Findings:                 Long segment, circumferential, non-nodular  ?                          Barrett's appearing mucosa in the distal esophagus.  ?                          Not sampled today. ?                          A large hiatal hernia was present. One of the  ?                          previously noted proximal gastric ulcers has  ?                          completely healed and the other ulcer is 90% healed  ?                          (  currently a 3-66m erosion). No neoplastic  ?                          appearing mucosa in the stomach. ?                          The exam was otherwise without abnormality. ?Complications:            No immediate complications. Estimated blood loss:  ?                          None. ?Estimated Blood Loss:     Estimated blood loss: none. ?Impression:               - Large hiatal hernia. One of the previously noted  ?                          proximal gastric ulcers has completely healed and  ?                          the other ulcer is 90% healed (currently a 3-415m ?                          erosion). No neoplastic appearing mucosa in the  ?                          stomach. ?                          - Long segment, circumferential, non-nodular  ?                          Barrett's appearing mucosa in the distal esophagus.  ?                          Not sampled today. ?                          - The examination was otherwise normal. ?Recommendation:           - Patient  has a contact number available for  ?                          emergencies. The signs and symptoms of potential  ?                          delayed complications were discussed with the  ?                          patient. Return to normal activities tomorrow.  ?                          Written discharge instructions were provided to the  ?                          patient. ?                          -  Resume previous diet. ?                          - Continue present medications. ?                          - No further surveillance is needed. ?Milus Banister, MD ?03/03/2022 11:19:33 AM ?This report has been signed electronically. ?

## 2022-03-03 NOTE — Progress Notes (Signed)
To Pacu, VSS. Report to [email or web address] 

## 2022-03-05 ENCOUNTER — Telehealth: Payer: Self-pay

## 2022-03-05 NOTE — Telephone Encounter (Signed)
?  Follow up Call- ? ? ?  03/03/2022  ? 10:48 AM  ?Call back number  ?Post procedure Call Back phone  # [phone]  ?Permission to leave phone message Yes  ?  ? ?Patient questions: ? ?Do you have a fever, pain , or abdominal swelling? No. ?Pain Score  0 * ? ?Have you tolerated food without any problems? Yes.   ? ?Have you been able to return to your normal activities? Yes.   ? ?Do you have any questions about your discharge instructions: ?Diet   No. ?Medications  No. ?Follow up visit  No. ? ?Do you have questions or concerns about your Care? No. ? ?Actions: ?* If pain score is 4 or above: ?No action needed, pain <4. ? ? ?

## 2022-03-16 ENCOUNTER — Telehealth: Payer: Self-pay | Admitting: Oncology

## 2022-03-16 NOTE — Telephone Encounter (Signed)
Called patient regarding upcoming appointment, patient is notified. °

## 2022-03-17 ENCOUNTER — Other Ambulatory Visit: Payer: Self-pay

## 2022-03-17 ENCOUNTER — Inpatient Hospital Stay: Payer: Medicare PPO | Attending: Oncology

## 2022-03-17 ENCOUNTER — Inpatient Hospital Stay (HOSPITAL_BASED_OUTPATIENT_CLINIC_OR_DEPARTMENT_OTHER): Payer: Medicare PPO | Admitting: Oncology

## 2022-03-17 VITALS — BP 140/66 | HR 68 | Temp 97.8°F | Resp 16 | Ht 62.0 in | Wt 105.6 lb

## 2022-03-17 DIAGNOSIS — D500 Iron deficiency anemia secondary to blood loss (chronic): Secondary | ICD-10-CM

## 2022-03-17 DIAGNOSIS — Z79899 Other long term (current) drug therapy: Secondary | ICD-10-CM | POA: Insufficient documentation

## 2022-03-17 LAB — CBC WITH DIFFERENTIAL (CANCER CENTER ONLY)
Abs Immature Granulocytes: 0.03 10*3/uL (ref 0.00–0.07)
Basophils Absolute: 0.1 10*3/uL (ref 0.0–0.1)
Basophils Relative: 1 %
Eosinophils Absolute: 0.1 10*3/uL (ref 0.0–0.5)
Eosinophils Relative: 1 %
HCT: 38.7 % (ref 36.0–46.0)
Hemoglobin: 12.5 g/dL (ref 12.0–15.0)
Immature Granulocytes: 0 %
Lymphocytes Relative: 23 %
Lymphs Abs: 1.7 10*3/uL (ref 0.7–4.0)
MCH: 29.6 pg (ref 26.0–34.0)
MCHC: 32.3 g/dL (ref 30.0–36.0)
MCV: 91.5 fL (ref 80.0–100.0)
Monocytes Absolute: 0.8 10*3/uL (ref 0.1–1.0)
Monocytes Relative: 11 %
Neutro Abs: 4.8 10*3/uL (ref 1.7–7.7)
Neutrophils Relative %: 64 %
Platelet Count: 284 10*3/uL (ref 150–400)
RBC: 4.23 MIL/uL (ref 3.87–5.11)
RDW: 15.1 % (ref 11.5–15.5)
WBC Count: 7.6 10*3/uL (ref 4.0–10.5)
nRBC: 0 % (ref 0.0–0.2)

## 2022-03-17 LAB — IRON AND IRON BINDING CAPACITY (CC-WL,HP ONLY)
Iron: 94 ug/dL (ref 28–170)
Saturation Ratios: 29 % (ref 10.4–31.8)
TIBC: 325 ug/dL (ref 250–450)
UIBC: 231 ug/dL (ref 148–442)

## 2022-03-17 LAB — FERRITIN: Ferritin: 13 ng/mL (ref 11–307)

## 2022-03-17 NOTE — Progress Notes (Signed)
Hematology and Oncology Follow Up Visit ? ?Cheryl Maldonado ?[id number] ?Jan 26, 1933 88 y.o. ?03/17/2022 1:19 PM ?[place], [place], [place] Reader, FNP  ? ?Principle Diagnosis: 88 year old with iron deficiency anemia diagnosed in February 2023.  She was found to have a hemoglobin of 6.8 related to GI blood losses. ? ? ?Prior Therapy: She is status post IV iron infusion and oral iron therapy. ? ?Current therapy: Multivitamin oral therapy. ? ?Interim History: Cheryl Maldonado returns today for a follow-up visit.  Since last visit, she reports no major changes in her health.  She continues to take multivitamin oral replacement therapy without any complications.  She denies any nausea, vomiting or abdominal pain.  She denies any dyspepsia, hematochezia or melena.  Performance status quality of life remains overall stable.  She does ambulate with the help of a cane without any falls or syncope. ? ? ? ? ?Medications: I have reviewed the patient's current medications.  ?Current Outpatient Medications  ?Medication Sig Dispense Refill  ? acetaminophen (TYLENOL) 325 MG tablet Take 1-2 tablets (325-650 mg total) by mouth every 6 (six) hours as needed for mild pain (pain score 1-3 or temp > 100.5). 60 tablet 0  ? amLODipine (NORVASC) 5 MG tablet Take 5 mg by mouth daily.    ? levothyroxine (SYNTHROID, LEVOTHROID) 75 MCG tablet Take 75 mcg by mouth daily before breakfast.    ? menthol-cetylpyridinium (CEPACOL) 3 MG lozenge Take 1 lozenge (3 mg total) by mouth as needed for sore throat. 100 tablet 12  ? oxymetazoline (AFRIN) 0.05 % nasal spray Place 1 spray into both nostrils daily as needed for congestion.    ? pantoprazole (PROTONIX) 40 MG tablet Take 1 tablet (40 mg total) by mouth 2 (two) times daily before a meal. 168 tablet 0  ? Pediatric Multivitamins-Iron Vicki Mallet W/IRON PO) Take 2 tablets by mouth daily.    ? raloxifene (EVISTA) 60 MG tablet Take 60 mg by mouth daily.    ? simvastatin (ZOCOR) 5 MG tablet Take 5 mg by  mouth every other day.    ? telmisartan (MICARDIS) 80 MG tablet Take 80 mg by mouth daily.    ? ?No current facility-administered medications for this visit.  ? ? ? ?Allergies: No Known Allergies ? ? ? ?Physical Exam: ?Blood pressure 140/66, pulse 68, temperature 97.8 ?F (36.6 ?C), temperature source Temporal, resp. rate 16, height '5\' 2"'$  (1.575 m), weight 105 lb 9.6 oz (47.9 kg), SpO2 100 %. ? ?ECOG: 1 ? ? ? ?General appearance: Comfortable appearing without any discomfort ?Head: Normocephalic without any trauma ?Oropharynx: Mucous membranes are moist and pink without any thrush or ulcers. ?Eyes: Pupils are equal and round reactive to light. ?Lymph nodes: No cervical, supraclavicular, inguinal or axillary lymphadenopathy.   ?Heart:regular rate and rhythm.  S1 and S2 without leg edema. ?Lung: Clear without any rhonchi or wheezes.  No dullness to percussion. ?Abdomin: Soft, nontender, nondistended with good bowel sounds.  No hepatosplenomegaly. ?Musculoskeletal: No joint deformity or effusion.  Full range of motion noted. ?Neurological: No deficits noted on motor, sensory and deep tendon reflex exam. ?Skin: No petechial rash or dryness.  Appeared moist.  ? ? ? ? ?Lab Results: ?Lab Results  ?Component Value Date  ? WBC 7.6 03/17/2022  ? HGB 12.5 03/17/2022  ? HCT 38.7 03/17/2022  ? MCV 91.5 03/17/2022  ? PLT 284 03/17/2022  ? ?  Chemistry   ?   ?Component Value Date/Time  ? NA 136 12/30/2021 0508  ? K 4.0 12/30/2021  1021  ? CL 111 12/30/2021 0508  ? CO2 19 (L) 12/30/2021 0508  ? BUN 18 12/30/2021 0508  ? CREATININE 0.77 12/30/2021 0508  ?    ?Component Value Date/Time  ? CALCIUM 8.4 (L) 12/30/2021 0508  ? ALKPHOS 36 (L) 12/30/2021 0508  ? AST 20 12/30/2021 0508  ? ALT 15 12/30/2021 0508  ? BILITOT 0.4 12/30/2021 1173  ?  ? ? ? ? ? ? ?Impression and Plan: ? ? ?88 year old with: ? ?1.  Iron deficiency anemia diagnosed in February 2023 related to GI blood losses.  She received packed red cell transfusion and oral iron  therapy with correction of her anemia. ? ? ?Laboratory data from today reviewed and showed hemoglobin 12.5 with iron studies are currently pending.  Risks and benefits of continuing multivitamins were discussed.  At this time given her excellent tolerance and reasonable maintenance of her hemoglobin I recommended continued multivitamins without any additional intervention. ? ?2.  Follow-up: I am happy to see her in the future as needed. ? ?20  minutes were spent on this encounter.  The time was dedicated to reviewing disease status, treatment choices and outlining future plan of care. ? ?Zola Button, MD ?5/16/20231:19 PM ? ?

## 2022-04-09 ENCOUNTER — Observation Stay (HOSPITAL_COMMUNITY): Payer: Medicare PPO

## 2022-04-09 ENCOUNTER — Encounter (HOSPITAL_COMMUNITY): Payer: Self-pay

## 2022-04-09 ENCOUNTER — Other Ambulatory Visit: Payer: Self-pay

## 2022-04-09 ENCOUNTER — Observation Stay (HOSPITAL_COMMUNITY)
Admission: EM | Admit: 2022-04-09 | Discharge: 2022-04-11 | Disposition: A | Discharge status: Home Health | Payer: Medicare PPO | Attending: Emergency Medicine | Admitting: Emergency Medicine

## 2022-04-09 ENCOUNTER — Emergency Department (HOSPITAL_COMMUNITY): Payer: Medicare PPO

## 2022-04-09 DIAGNOSIS — F0390 Unspecified dementia without behavioral disturbance: Secondary | ICD-10-CM | POA: Insufficient documentation

## 2022-04-09 DIAGNOSIS — Z96641 Presence of right artificial hip joint: Secondary | ICD-10-CM | POA: Diagnosis not present

## 2022-04-09 DIAGNOSIS — R82998 Other abnormal findings in urine: Secondary | ICD-10-CM | POA: Insufficient documentation

## 2022-04-09 DIAGNOSIS — M6281 Muscle weakness (generalized): Secondary | ICD-10-CM | POA: Insufficient documentation

## 2022-04-09 DIAGNOSIS — Z96643 Presence of artificial hip joint, bilateral: Secondary | ICD-10-CM | POA: Diagnosis not present

## 2022-04-09 DIAGNOSIS — W19XXXA Unspecified fall, initial encounter: Secondary | ICD-10-CM | POA: Diagnosis not present

## 2022-04-09 DIAGNOSIS — G319 Degenerative disease of nervous system, unspecified: Secondary | ICD-10-CM | POA: Diagnosis not present

## 2022-04-09 DIAGNOSIS — K5900 Constipation, unspecified: Secondary | ICD-10-CM | POA: Diagnosis not present

## 2022-04-09 DIAGNOSIS — S3282XA Multiple fractures of pelvis without disruption of pelvic ring, initial encounter for closed fracture: Secondary | ICD-10-CM

## 2022-04-09 DIAGNOSIS — Z79899 Other long term (current) drug therapy: Secondary | ICD-10-CM | POA: Diagnosis not present

## 2022-04-09 DIAGNOSIS — I10 Essential (primary) hypertension: Secondary | ICD-10-CM | POA: Diagnosis present

## 2022-04-09 DIAGNOSIS — Z79890 Hormone replacement therapy (postmenopausal): Secondary | ICD-10-CM | POA: Insufficient documentation

## 2022-04-09 DIAGNOSIS — Y92009 Unspecified place in unspecified non-institutional (private) residence as the place of occurrence of the external cause: Secondary | ICD-10-CM | POA: Insufficient documentation

## 2022-04-09 DIAGNOSIS — S32110A Nondisplaced Zone I fracture of sacrum, initial encounter for closed fracture: Secondary | ICD-10-CM | POA: Diagnosis not present

## 2022-04-09 DIAGNOSIS — R918 Other nonspecific abnormal finding of lung field: Secondary | ICD-10-CM | POA: Diagnosis present

## 2022-04-09 DIAGNOSIS — Z8542 Personal history of malignant neoplasm of other parts of uterus: Secondary | ICD-10-CM | POA: Insufficient documentation

## 2022-04-09 DIAGNOSIS — R5383 Other fatigue: Secondary | ICD-10-CM | POA: Insufficient documentation

## 2022-04-09 DIAGNOSIS — Z853 Personal history of malignant neoplasm of breast: Secondary | ICD-10-CM | POA: Insufficient documentation

## 2022-04-09 DIAGNOSIS — Z8711 Personal history of peptic ulcer disease: Secondary | ICD-10-CM | POA: Diagnosis not present

## 2022-04-09 DIAGNOSIS — D649 Anemia, unspecified: Secondary | ICD-10-CM | POA: Diagnosis present

## 2022-04-09 DIAGNOSIS — E039 Hypothyroidism, unspecified: Secondary | ICD-10-CM | POA: Diagnosis not present

## 2022-04-09 DIAGNOSIS — S2232XA Fracture of one rib, left side, initial encounter for closed fracture: Secondary | ICD-10-CM | POA: Insufficient documentation

## 2022-04-09 DIAGNOSIS — M25551 Pain in right hip: Secondary | ICD-10-CM | POA: Diagnosis present

## 2022-04-09 DIAGNOSIS — E785 Hyperlipidemia, unspecified: Secondary | ICD-10-CM | POA: Diagnosis not present

## 2022-04-09 DIAGNOSIS — K2270 Barrett's esophagus without dysplasia: Secondary | ICD-10-CM | POA: Insufficient documentation

## 2022-04-09 DIAGNOSIS — Z743 Need for continuous supervision: Secondary | ICD-10-CM | POA: Diagnosis not present

## 2022-04-09 DIAGNOSIS — R600 Localized edema: Secondary | ICD-10-CM | POA: Diagnosis not present

## 2022-04-09 DIAGNOSIS — R2681 Unsteadiness on feet: Secondary | ICD-10-CM | POA: Insufficient documentation

## 2022-04-09 DIAGNOSIS — S0990XA Unspecified injury of head, initial encounter: Secondary | ICD-10-CM | POA: Diagnosis not present

## 2022-04-09 DIAGNOSIS — I890 Lymphedema, not elsewhere classified: Secondary | ICD-10-CM | POA: Insufficient documentation

## 2022-04-09 DIAGNOSIS — I6782 Cerebral ischemia: Secondary | ICD-10-CM | POA: Insufficient documentation

## 2022-04-09 DIAGNOSIS — Z9181 History of falling: Secondary | ICD-10-CM | POA: Insufficient documentation

## 2022-04-09 DIAGNOSIS — S32810A Multiple fractures of pelvis with stable disruption of pelvic ring, initial encounter for closed fracture: Principal | ICD-10-CM | POA: Insufficient documentation

## 2022-04-09 DIAGNOSIS — K219 Gastro-esophageal reflux disease without esophagitis: Secondary | ICD-10-CM | POA: Diagnosis not present

## 2022-04-09 DIAGNOSIS — S32511A Fracture of superior rim of right pubis, initial encounter for closed fracture: Secondary | ICD-10-CM | POA: Diagnosis not present

## 2022-04-09 DIAGNOSIS — R7989 Other specified abnormal findings of blood chemistry: Secondary | ICD-10-CM | POA: Diagnosis not present

## 2022-04-09 DIAGNOSIS — Z8719 Personal history of other diseases of the digestive system: Secondary | ICD-10-CM | POA: Diagnosis not present

## 2022-04-09 DIAGNOSIS — C50919 Malignant neoplasm of unspecified site of unspecified female breast: Secondary | ICD-10-CM | POA: Diagnosis present

## 2022-04-09 DIAGNOSIS — S2239XA Fracture of one rib, unspecified side, initial encounter for closed fracture: Secondary | ICD-10-CM | POA: Diagnosis present

## 2022-04-09 DIAGNOSIS — M4316 Spondylolisthesis, lumbar region: Secondary | ICD-10-CM | POA: Diagnosis not present

## 2022-04-09 DIAGNOSIS — R6889 Other general symptoms and signs: Secondary | ICD-10-CM | POA: Diagnosis not present

## 2022-04-09 DIAGNOSIS — D500 Iron deficiency anemia secondary to blood loss (chronic): Secondary | ICD-10-CM | POA: Insufficient documentation

## 2022-04-09 DIAGNOSIS — S329XXA Fracture of unspecified parts of lumbosacral spine and pelvis, initial encounter for closed fracture: Secondary | ICD-10-CM | POA: Diagnosis present

## 2022-04-09 DIAGNOSIS — Z8541 Personal history of malignant neoplasm of cervix uteri: Secondary | ICD-10-CM | POA: Diagnosis not present

## 2022-04-09 DIAGNOSIS — R4189 Other symptoms and signs involving cognitive functions and awareness: Secondary | ICD-10-CM | POA: Insufficient documentation

## 2022-04-09 DIAGNOSIS — Z800 Family history of malignant neoplasm of digestive organs: Secondary | ICD-10-CM | POA: Insufficient documentation

## 2022-04-09 DIAGNOSIS — S32591A Other specified fracture of right pubis, initial encounter for closed fracture: Secondary | ICD-10-CM | POA: Diagnosis not present

## 2022-04-09 DIAGNOSIS — E673 Hypervitaminosis D: Secondary | ICD-10-CM | POA: Insufficient documentation

## 2022-04-09 LAB — HEPATIC FUNCTION PANEL
ALT: 21 U/L (ref 0–44)
AST: 27 U/L (ref 15–41)
Albumin: 3.7 g/dL (ref 3.5–5.0)
Alkaline Phosphatase: 48 U/L (ref 38–126)
Bilirubin, Direct: 0.1 mg/dL (ref 0.0–0.2)
Indirect Bilirubin: 0.5 mg/dL (ref 0.3–0.9)
Total Bilirubin: 0.6 mg/dL (ref 0.3–1.2)
Total Protein: 6.3 g/dL — ABNORMAL LOW (ref 6.5–8.1)

## 2022-04-09 LAB — BASIC METABOLIC PANEL
Anion gap: 9 (ref 5–15)
BUN: 26 mg/dL — ABNORMAL HIGH (ref 8–23)
CO2: 25 mmol/L (ref 22–32)
Calcium: 9.7 mg/dL (ref 8.9–10.3)
Chloride: 106 mmol/L (ref 98–111)
Creatinine, Ser: 0.84 mg/dL (ref 0.44–1.00)
GFR, Estimated: >60 mL/min (ref >60)
Glucose, Bld: 118 mg/dL — ABNORMAL HIGH (ref 70–99)
Potassium: 4 mmol/L (ref 3.5–5.1)
Sodium: 140 mmol/L (ref 135–145)

## 2022-04-09 LAB — CBC WITH DIFFERENTIAL/PLATELET
Abs Immature Granulocytes: 0.09 10*3/uL — ABNORMAL HIGH (ref 0.00–0.07)
Basophils Absolute: 0 10*3/uL (ref 0.0–0.1)
Basophils Relative: 0 %
Eosinophils Absolute: 0 10*3/uL (ref 0.0–0.5)
Eosinophils Relative: 0 %
HCT: 38.4 % (ref 36.0–46.0)
Hemoglobin: 12.7 g/dL (ref 12.0–15.0)
Immature Granulocytes: 1 %
Lymphocytes Relative: 5 %
Lymphs Abs: 0.8 10*3/uL (ref 0.7–4.0)
MCH: 29.8 pg (ref 26.0–34.0)
MCHC: 33.1 g/dL (ref 30.0–36.0)
MCV: 90.1 fL (ref 80.0–100.0)
Monocytes Absolute: 0.9 10*3/uL (ref 0.1–1.0)
Monocytes Relative: 6 %
Neutro Abs: 12.9 10*3/uL — ABNORMAL HIGH (ref 1.7–7.7)
Neutrophils Relative %: 88 %
Platelets: 275 10*3/uL (ref 150–400)
RBC: 4.26 MIL/uL (ref 3.87–5.11)
RDW: 16.5 % — ABNORMAL HIGH (ref 11.5–15.5)
WBC: 14.7 10*3/uL — ABNORMAL HIGH (ref 4.0–10.5)
nRBC: 0 % (ref 0.0–0.2)

## 2022-04-09 LAB — URINALYSIS, COMPLETE (UACMP) WITH MICROSCOPIC
Bilirubin Urine: NEGATIVE
Glucose, UA: NEGATIVE mg/dL
Hgb urine dipstick: NEGATIVE
Ketones, ur: NEGATIVE mg/dL
Nitrite: POSITIVE — AB
Protein, ur: NEGATIVE mg/dL
Specific Gravity, Urine: 1.016 (ref 1.005–1.030)
pH: 6 (ref 5.0–8.0)

## 2022-04-09 LAB — CK: Total CK: 128 U/L (ref 38–234)

## 2022-04-09 LAB — MAGNESIUM: Magnesium: 2.1 mg/dL (ref 1.7–2.4)

## 2022-04-09 LAB — PHOSPHORUS: Phosphorus: 3.4 mg/dL (ref 2.5–4.6)

## 2022-04-09 MED ORDER — ENOXAPARIN SODIUM 30 MG/0.3ML IJ SOSY: 30.0000 mg | PREFILLED_SYRINGE | INTRAMUSCULAR | Status: DC

## 2022-04-09 MED ORDER — BISACODYL 10 MG RE SUPP: 10.0000 mg | Freq: Every day | RECTAL | Status: DC | PRN

## 2022-04-09 MED ORDER — PANTOPRAZOLE SODIUM 40 MG PO TBEC
40.0000 mg | DELAYED_RELEASE_TABLET | Freq: Two times a day (BID) | ORAL | Status: DC
  Administered 2022-04-10 – 2022-04-11 (×3): 40 mg via ORAL
  Filled 2022-04-09 (×3): qty 1

## 2022-04-09 MED ORDER — DOCUSATE SODIUM 100 MG PO CAPS
100.0000 mg | ORAL_CAPSULE | Freq: Two times a day (BID) | ORAL | Status: DC
  Administered 2022-04-09 – 2022-04-11 (×4): 100 mg via ORAL
  Filled 2022-04-09 (×4): qty 1

## 2022-04-09 MED ORDER — METHOCARBAMOL 1000 MG/10ML IJ SOLN: 500.0000 mg | Freq: Four times a day (QID) | INTRAVENOUS | Status: DC | PRN

## 2022-04-09 MED ORDER — IRBESARTAN 150 MG PO TABS
300.0000 mg | ORAL_TABLET | Freq: Every day | ORAL | Status: DC
  Administered 2022-04-10 – 2022-04-11 (×2): 300 mg via ORAL
  Filled 2022-04-09 (×2): qty 2

## 2022-04-09 MED ORDER — SIMVASTATIN 5 MG PO TABS
5.0000 mg | ORAL_TABLET | ORAL | Status: DC
  Administered 2022-04-10: 5 mg via ORAL
  Filled 2022-04-09: qty 1

## 2022-04-09 MED ORDER — POLYETHYLENE GLYCOL 3350 17 G PO PACK: 17.0000 g | PACK | Freq: Every day | ORAL | Status: DC | PRN

## 2022-04-09 MED ORDER — MORPHINE SULFATE (PF) 2 MG/ML IV SOLN: 0.5000 mg | INTRAVENOUS | Status: DC | PRN

## 2022-04-09 MED ORDER — METHOCARBAMOL 500 MG PO TABS
500.0000 mg | ORAL_TABLET | Freq: Four times a day (QID) | ORAL | Status: DC | PRN
  Administered 2022-04-10 – 2022-04-11 (×2): 500 mg via ORAL
  Filled 2022-04-09 (×2): qty 1

## 2022-04-09 MED ORDER — AMLODIPINE BESYLATE 5 MG PO TABS
5.0000 mg | ORAL_TABLET | Freq: Every day | ORAL | Status: DC
  Administered 2022-04-10 – 2022-04-11 (×2): 5 mg via ORAL
  Filled 2022-04-09 (×2): qty 1

## 2022-04-09 MED ORDER — LEVOTHYROXINE SODIUM 75 MCG PO TABS
75.0000 ug | ORAL_TABLET | Freq: Every day | ORAL | Status: DC
  Administered 2022-04-10 – 2022-04-11 (×2): 75 ug via ORAL
  Filled 2022-04-09 (×2): qty 1

## 2022-04-09 MED ORDER — HYDROCODONE-ACETAMINOPHEN 5-325 MG PO TABS
1.0000 | ORAL_TABLET | Freq: Four times a day (QID) | ORAL | Status: DC | PRN
  Administered 2022-04-09: 1 via ORAL
  Administered 2022-04-10: 2 via ORAL
  Administered 2022-04-10 – 2022-04-11 (×3): 1 via ORAL
  Filled 2022-04-09 (×6): qty 1

## 2022-04-09 MED ORDER — SENNA 8.6 MG PO TABS
1.0000 | ORAL_TABLET | Freq: Two times a day (BID) | ORAL | Status: DC
  Administered 2022-04-09 – 2022-04-11 (×4): 8.6 mg via ORAL
  Filled 2022-04-09 (×4): qty 1

## 2022-04-09 NOTE — ED Provider Notes (Signed)
[facility] DEPT Provider Note   CSN: [id number] Arrival date & time: 04/09/22  1432     History  Chief Complaint  Patient presents with   Margaurite Salido is a 88 y.o. female.  HPI Adult female with dementia presents after a fall.  History is obtained by the caregiver, EMS report the patient has dementia.  Level 5 caveat. Patient is not on blood thinners has a history of bilateral hip replacement, fell today and since that point has been unwilling to bear weight on the right leg.  EMS reports no hemodynamic instability in route.    Home Medications Prior to Admission medications   Medication Sig Start Date End Date Taking? Authorizing Provider  acetaminophen (TYLENOL) 325 MG tablet Take 1-2 tablets (325-650 mg total) by mouth every 6 (six) hours as needed for mild pain (pain score 1-3 or temp > 100.5). 12/08/20  Yes Cristal Deer, MD  amLODipine (NORVASC) 5 MG tablet Take 5 mg by mouth daily.   Yes [provider]  levothyroxine (SYNTHROID, LEVOTHROID) 75 MCG tablet Take 75 mcg by mouth daily before breakfast.   Yes [provider]  menthol-cetylpyridinium (CEPACOL) 3 MG lozenge Take 1 lozenge (3 mg total) by mouth as needed for sore throat. Patient taking differently: Take 1 lozenge by mouth daily as needed for sore throat. 12/08/20  Yes Cristal Deer, MD  oxymetazoline (AFRIN) 0.05 % nasal spray Place 1 spray into both nostrils daily as needed for congestion.   Yes [provider]  pantoprazole (PROTONIX) 40 MG tablet Take 1 tablet (40 mg total) by mouth 2 (two) times daily before a meal. 01/26/22 04/20/22 Yes Milus Banister, MD  Pediatric Multivitamins-Iron Vicki Mallet W/IRON PO) Take 2 tablets by mouth daily.   Yes [provider]  raloxifene (EVISTA) 60 MG tablet Take 60 mg by mouth daily.   Yes [provider]  simvastatin (ZOCOR) 5 MG tablet Take 5 mg by mouth every other day.   Yes [provider]  telmisartan (MICARDIS) 80 MG tablet Take 80 mg by mouth daily. 10/30/20  Yes [provider]      Allergies    Patient has no known allergies.    Review of Systems   Review of Systems  All other systems reviewed and are negative.   Physical Exam Updated Vital Signs BP (!) 164/71 (BP Location: Left Arm)   Pulse 85   Temp 97.9 F (36.6 C) (Oral)   Resp 18   Ht '5\' 2"'$  (1.575 m)   Wt 47.7 kg   SpO2 96%   BMI 19.23 kg/m  Physical Exam Vitals and nursing note reviewed.  Constitutional:      General: She is not in acute distress.    Appearance: She is well-developed.  HENT:     Head: Normocephalic and atraumatic.  Eyes:     Conjunctiva/sclera: Conjunctivae normal.  Cardiovascular:     Rate and Rhythm: Normal rate and regular rhythm.  Pulmonary:     Effort: Pulmonary effort is normal. No respiratory distress.     Breath sounds: Normal breath sounds. No stridor.  Abdominal:     General: There is no distension.  Musculoskeletal:       Arms:  Skin:    General: Skin is warm and dry.  Neurological:     General: No focal deficit present.     Mental Status: She is alert.     Cranial Nerves: No cranial nerve deficit.  Psychiatric:        Behavior: Behavior is withdrawn.        Cognition and Memory: Cognition is impaired. Memory is impaired.     ED Results / Procedures / Treatments   Labs (all labs ordered are listed, but only abnormal results are displayed) Labs Reviewed  BASIC METABOLIC PANEL - Abnormal; Notable for the following components:      Result Value   Glucose, Bld 118 (*)    BUN 26 (*)    All other components within normal limits  CBC WITH DIFFERENTIAL/PLATELET - Abnormal; Notable for the following components:   WBC 14.7 (*)    RDW 16.5 (*)    Neutro Abs 12.9 (*)    Abs Immature Granulocytes 0.09 (*)    All other components within normal limits  HEPATIC FUNCTION PANEL - Abnormal; Notable for the following components:   Total  Protein 6.3 (*)    All other components within normal limits  URINALYSIS, COMPLETE (UACMP) WITH MICROSCOPIC - Abnormal; Notable for the following components:   APPearance HAZY (*)    Nitrite POSITIVE (*)    Leukocytes,Ua TRACE (*)    Bacteria, UA MANY (*)    All other components within normal limits  CK  MAGNESIUM  PHOSPHORUS  PREALBUMIN  CBC WITH DIFFERENTIAL/PLATELET  COMPREHENSIVE METABOLIC PANEL  VITAMIN D 25 HYDROXY (VIT D DEFICIENCY, FRACTURES)    EKG None  Radiology DG Abd 1 View  Result Date: 04/09/2022 CLINICAL DATA:  Fall EXAM: ABDOMEN - 1 VIEW COMPARISON:  None Available. FINDINGS: Nonobstructive bowel gas pattern. Moderate stool burden. There are numerous surgical clips overlying the abdomen and pelvis. Prior right hip arthroplasty. Prior left proximal femur fixation. There are mildly displaced fractures of the right superior and inferior pubic rami. Nondisplaced right sacral ala fracture best seen on same day CT. IMPRESSION: No evidence of bowel obstruction.  Moderate stool burden. Mildly displaced right superior and inferior pubic rami fractures. Nondisplaced right sacral ala fracture. Electronically Signed   By: Maurine Simmering M.D.   On: 04/09/2022 20:22   DG CHEST PORT 1 VIEW  Result Date: 04/09/2022 CLINICAL DATA:  Fall EXAM: PORTABLE CHEST 1 VIEW COMPARISON:  Radiograph 04/11/2019 FINDINGS: Unchanged cardiomediastinal silhouette. There is left costophrenic angle blunting suggesting trace effusion or pleural thickening. There is no focal airspace disease. No pneumothorax. There is a questionable nondisplaced fracture involving the anterior left eleventh rib. IMPRESSION: Questionable nondisplaced left anterior eleventh rib fracture. Left costophrenic angle blunting suggesting a trace effusion. Electronically Signed   By: Maurine Simmering M.D.   On: 04/09/2022 20:20   CT HEAD WO CONTRAST (5MM)  Result Date: 04/09/2022 CLINICAL DATA:  Head trauma, fall. EXAM: CT HEAD WITHOUT  CONTRAST TECHNIQUE: Contiguous axial images were obtained from the base of the skull through the vertex without intravenous contrast. RADIATION DOSE REDUCTION: This exam was performed according to the departmental dose-optimization program which includes automated exposure control, adjustment of the mA and/or kV according to patient size and/or use of iterative reconstruction technique. COMPARISON:  None Available. FINDINGS: Brain: No acute intracranial hemorrhage, midline shift or mass effect. No extra-axial fluid collection. Mild diffuse atrophy is noted. Periventricular and subcortical white matter hypodensities are present bilaterally. No hydrocephalus. Vascular: No hyperdense vessel or unexpected calcification. Skull: Normal. Negative for fracture or focal lesion. Sinuses/Orbits: No acute finding. Other: None. IMPRESSION: 1. No acute intracranial process. 2. Atrophy with chronic microvascular ischemic changes. Electronically Signed   By: Brett Fairy M.D.   On:  04/09/2022 20:08   CT PELVIS WO CONTRAST  Result Date: 04/09/2022 CLINICAL DATA:  Mechanical fall.  Evaluate for fracture. EXAM: CT PELVIS WITHOUT CONTRAST TECHNIQUE: Multidetector CT imaging of the pelvis was performed following the standard protocol without intravenous contrast. RADIATION DOSE REDUCTION: This exam was performed according to the departmental dose-optimization program which includes automated exposure control, adjustment of the mA and/or kV according to patient size and/or use of iterative reconstruction technique. COMPARISON:  04/09/2022 FINDINGS: Bones/Joint/Cartilage Acute nondisplaced fractures of the right superior and inferior pubic rami. Acute nondisplaced right sacral ala fracture. Slight buckling of the anterior cortex of the S2 vertebral body concerning for a subtle fracture. Right total hip arthroplasty without hardware failure or complication. No right hip fracture or dislocation. No left hip fracture or dislocation.  Prior ORIF of a healed intertrochanteric fracture. Chronic L4 vertebral body compression fracture. Grade 1 anterolisthesis of L4 on L5 secondary to facet disease. Normal alignment. No joint effusion. Ligaments Ligaments are suboptimally evaluated by CT. Muscles and Tendons Muscles are normal. No muscle atrophy. No intramuscular fluid collection or hematoma. Soft tissue No fluid collection or hematoma.  No soft tissue mass. IMPRESSION: 1. Acute nondisplaced fractures of the right superior and inferior pubic rami. 2. Acute nondisplaced right sacral ala fracture. 3. Electronically Signed   By: Kathreen Devoid M.D.   On: 04/09/2022 17:32   DG Hip Unilat W or Wo Pelvis 2-3 Views Right  Result Date: 04/09/2022 CLINICAL DATA:  Fall. EXAM: DG HIP (WITH OR WITHOUT PELVIS) 2-3V RIGHT COMPARISON:  Right hip x-ray 12/05/2020 FINDINGS: There is a right hip arthroplasty in anatomic alignment. There is a left-sided hip screw and intramedullary nail. The bones are osteopenic. There is an acute minimally displaced fracture of the right superior pubic ramus and right inferior pubic ramus. Pubic symphysis appears intact. There is no hardware loosening. Multiple surgical clips overlie the pelvis. IMPRESSION: 1. Acute fractures of the right inferior and superior pubic rami. 2. No dislocation. 3. Right hip arthroplasty and left-sided hip screw appear uncomplicated. Electronically Signed   By: Ronney Asters M.D.   On: 04/09/2022 15:46    Procedures Procedures    Medications Ordered in ED Medications  amLODipine (NORVASC) tablet 5 mg (has no administration in time range)  levothyroxine (SYNTHROID) tablet 75 mcg (has no administration in time range)  pantoprazole (PROTONIX) EC tablet 40 mg (has no administration in time range)  simvastatin (ZOCOR) tablet 5 mg (has no administration in time range)  irbesartan (AVAPRO) tablet 300 mg (has no administration in time range)  HYDROcodone-acetaminophen (NORCO/VICODIN) 5-325 MG per  tablet 1-2 tablet (1 tablet Oral Given 04/09/22 2259)  morphine (PF) 2 MG/ML injection 0.5 mg (has no administration in time range)  methocarbamol (ROBAXIN) tablet 500 mg (has no administration in time range)    Or  methocarbamol (ROBAXIN) 500 mg in dextrose 5 % 50 mL IVPB (has no administration in time range)  docusate sodium (COLACE) capsule 100 mg (100 mg Oral Given 04/09/22 2259)  senna (SENOKOT) tablet 8.6 mg (8.6 mg Oral Given 04/09/22 2258)  polyethylene glycol (MIRALAX / GLYCOLAX) packet 17 g (has no administration in time range)  bisacodyl (DULCOLAX) suppository 10 mg (has no administration in time range)    ED Course/ Medical Decision Making/ A&P  This patient with a Hx of dementia, bilateral hip replacements presents to the ED for concern of with hip pain, this involves an extensive number of treatment options, and is a complaint that carries with it  a high risk of complications and morbidity.    The differential diagnosis includes fracture, hip, pelvis, other, lower suspicion for head trauma   Social Determinants of Health:  Age, dementia  Additional history obtained:  Additional history and/or information obtained from caregiver, notable for HPI, details from EMS as well all included above   After the initial evaluation, orders, including: X-ray labs were initiated.   Patient placed on Cardiac and Pulse-Oximetry Monitors. The patient was maintained on a cardiac monitor.  The cardiac monitored showed an rhythm of 90 sinus normal The patient was also maintained on pulse oximetry. The readings were typically 100% room air normal   On repeat evaluation of the patient stayed the same  Lab Tests:  I personally interpreted labs.  The pertinent results include: Abnormal urinalysis, slight elevation in BUN, patient has no urinary complaints  Imaging Studies ordered:  I independently visualized and interpreted imaging which showed x-ray, follow-up CT, with multiple fractures  pelvis, hip arthroplasty appears uncomplicated I agree with the radiologist interpretation  Consultations Obtained:  I requested consultation with the orthopedics,  and discussed lab and imaging findings as well as pertinent plan - they recommend: Admission for medical management, nonoperative repair of pelvic fracture  Dispostion / Final MDM:  After consideration of the diagnostic results and the patient's response to treatment, this adult female presenting after a fall, coming in by caregiver, subsequently by her son, is found to have multiple pelvic fractures.  Patient awake, alert, distally neurovascularly intact, hesitant to move her legs, nonweightbearing.  No evidence for concurrent nerve system disruption, patient seen by hospitalist, Ortho, Dr. Rolena Infante, admitted for monitoring, management.    Final Clinical Impression(s) / ED Diagnoses Final diagnoses:  Fall, initial encounter  Multiple closed fractures of pelvis with stable disruption of pelvic ring, initial encounter [facility])    Rx / DC Orders ED Discharge Orders     None         Carmin Muskrat, MD 04/09/22 2303

## 2022-04-09 NOTE — ED Triage Notes (Signed)
Pt BIB EMS from home s/p mechanical fall, not on blood thinners. Pt c/o right hip pain. Hx dementia  164/70 92 HR 95% RA

## 2022-04-09 NOTE — Subjective & Objective (Signed)
Presents with mechanical fall from home with severe pelvic pain unable to tolerate ambulation EMS was called patient was brought into [facility]. Patient states she is not on blood thinners denies head injury But does have history of dementia. History mainly obtained from son who is a caregiver patient himself unable to provide

## 2022-04-09 NOTE — Assessment & Plan Note (Signed)
Order KUB and bowel regimen

## 2022-04-09 NOTE — Assessment & Plan Note (Signed)
Continue PPI Protonix 40 mg twice a day

## 2022-04-09 NOTE — Assessment & Plan Note (Signed)
Will need PT OT prior to discharge may need admission to rehab.  Given fall and reported head injury will obtain CT head to further evaluate

## 2022-04-09 NOTE — Assessment & Plan Note (Signed)
Supportive management

## 2022-04-09 NOTE — Assessment & Plan Note (Signed)
Chronic stable continue Norvasc 5 mg and and Micardis 80 mg

## 2022-04-09 NOTE — Assessment & Plan Note (Signed)
Patient is followed by Dr. Alen Blew

## 2022-04-09 NOTE — Assessment & Plan Note (Signed)
Obtain chest x-ray to further evaluate

## 2022-04-09 NOTE — Assessment & Plan Note (Addendum)
PT OT assessment may need placement for rehab Weightbearing as tolerated Orthopedics, follow-up as an outpatient in 2 weeks with Dr. Lyla Glassing

## 2022-04-09 NOTE — Assessment & Plan Note (Signed)
Likely in setting of lymphedema.  Unclear if this is worse from baseline will obtain  Dopplers to rule evaluate for DVT

## 2022-04-09 NOTE — H&P (Addendum)
Cheryl Maldonado YJE:[id number] DOB: 23-Jul-1933 DOA: 04/09/2022     PCP: Holland Commons, FNP   Outpatient Specialists:     Oncology   Dr. Alen Blew GI Dr. Ardis Hughs    Patient arrived to ER on 04/09/22 at 1432 Referred by Attending Carmin Muskrat, MD   Patient coming from:    home Lives  With caretaker    Chief Complaint:   Chief Complaint  Patient presents with   Fall    HPI: Cheryl Maldonado is a 88 y.o. female with medical history significant of dementia, and iron deficiency anemia, history of GI blood loss, chronic gastric ulcer, HTN HLD, history of breast cancer, hypothyroidism, history of uterine cancer    Presented with   mechanical fall Presents with mechanical fall from home with severe pelvic pain unable to tolerate ambulation EMS was called patient was brought into [facility] wound. Patient states she is not on blood thinners denies head injury But does have history of dementia. History mainly obtained from son who is a caregiver patient himself unable to provide   Pt actually states she did hit her head but it does not hurt   Regarding pertinent Chronic problems    Hyperlipidemia -  on statins Zocor Lipid Panel      HTN on Norvasc Micardis  GERD, on PPI    Hypothyroidism:  Lab Results  Component Value Date   TSH 4.293 04/12/2019   on synthroid         Dementia -not on meds    Chronic anemia - baseline hg Hemoglobin & Hematocrit iron deficiency history of GI blood loss needed IV iron infusions followed by Dr. Alen Blew Recent Labs    02/23/22 1011 03/17/22 1311 04/09/22 1747  HGB 12.6 12.5 12.7     While in ER:    Imaging showing acute fracture of the right inferior and superior pubic rami  Confirmed by CT of the pelvis 1. Acute nondisplaced fractures of the right superior and inferior pubic rami. 2. Acute nondisplaced right sacral ala fracture.  Orthopedics consulted recommend admit to medicine for pain management   Patient can weight-bear as  tolerated with a walker and will follow-up with Dr. Lyla Glassing in 2 weeks.  CXR -  ordered  CT head  -  ordered  KUB  -  ordered  Following Medications were ordered in ER: Medications - No data to display  _______________________________________________________ ER Provider Called: Orthopedics   Dr Rolena Infante They Recommend admit to medicine    SEEN in ER   ED Triage Vitals  Enc Vitals Group     BP 04/09/22 1438 (!) 158/67     Pulse Rate 04/09/22 1438 90     Resp 04/09/22 1438 14     Temp 04/09/22 1438 97.9 F (36.6 C)     Temp Source 04/09/22 1438 Oral     SpO2 04/09/22 1438 100 %     Weight 04/09/22 1442 105 lb 9.6 oz (47.9 kg)     Height 04/09/22 1442 '5\' 2"'$  (1.575 m)     Head Circumference --      Peak Flow --      Pain Score 04/09/22 1442 8     Pain Loc --      Pain Edu? --      Excl. in [place]? --   TMAX(24)@     _________________________________________ Significant initial  Findings: Abnormal Labs Reviewed  CBC WITH DIFFERENTIAL/PLATELET - Abnormal; Notable for the following components:  Result Value   WBC 14.7 (*)    RDW 16.5 (*)    Neutro Abs 12.9 (*)    Abs Immature Granulocytes 0.09 (*)    All other components within normal limits      ECG: Ordered   The recent clinical data is shown below. Vitals:   04/09/22 1438 04/09/22 1442  BP: (!) 158/67   Pulse: 90   Resp: 14   Temp: 97.9 F (36.6 C)   TempSrc: Oral   SpO2: 100%   Weight:  47.9 kg  Height:  '5\' 2"'$  (1.575 m)    WBC     Component Value Date/Time   WBC 14.7 (H) 04/09/2022 1747   LYMPHSABS 0.8 04/09/2022 1747   MONOABS 0.9 04/09/2022 1747   EOSABS 0.0 04/09/2022 1747   BASOSABS 0.0 04/09/2022 1747     UA  ordered    _______________________________________________ Hospitalist was called for admission for pelvis fractures   The following Work up has been ordered so far:  Orders Placed This Encounter  Procedures   DG Hip Unilat W or Wo Pelvis 2-3 Views Right   CT PELVIS WO CONTRAST    Basic metabolic panel   CBC with Differential   CK   Hepatic function panel   Magnesium   Phosphorus   Prealbumin   Urinalysis, Complete w Microscopic   Consult to orthopedic surgery   Consult to hospitalist     OTHER Significant initial  Findings:  labs showing:    Recent Labs  Lab 04/09/22 1747  NA 140  K 4.0  CO2 25  GLUCOSE 118*  BUN 26*  CREATININE 0.84  CALCIUM 9.7  MG 2.1  PHOS 3.4    Cr   stable,    Lab Results  Component Value Date   CREATININE 0.84 04/09/2022   CREATININE 0.77 12/30/2021   CREATININE 0.66 12/29/2021    Recent Labs  Lab 04/09/22 1747  AST 27  ALT 21  ALKPHOS 48  BILITOT 0.6  PROT 6.3*  ALBUMIN 3.7   Lab Results  Component Value Date   CALCIUM 9.7 04/09/2022   PHOS 3.4 04/09/2022          Plt: Lab Results  Component Value Date   PLT 275 04/09/2022          Recent Labs  Lab 04/09/22 1747  WBC 14.7*  NEUTROABS 12.9*  HGB 12.7  HCT 38.4  MCV 90.1  PLT 275    HG/HCT  stable,       Component Value Date/Time   HGB 12.7 04/09/2022 1747   HGB 12.5 03/17/2022 1311   HCT 38.4 04/09/2022 1747   HCT 28.6 (L) 04/12/2019 0702   MCV 90.1 04/09/2022 1747     Cardiac Panel (last 3 results) Recent Labs    04/09/22 1747  CKTOTAL 128         Cultures:    Component Value Date/Time   SDES  04/11/2019 1751    URINE, CLEAN CATCH Performed at [facility], [place] [address] [place], [place] [address]    SPECREQUEST  04/11/2019 1751    NONE Performed at [facility], [place] [address], [place], [place] [address]    CULT >=100,000 COLONIES/mL ESCHERICHIA COLI (A) 04/11/2019 1751   REPTSTATUS 04/14/2019 FINAL 04/11/2019 1751     Radiological Exams on Admission: DG Abd 1 View  Result Date: 04/09/2022 CLINICAL DATA:  Fall EXAM: ABDOMEN - 1 VIEW COMPARISON:  None Available. FINDINGS: Nonobstructive bowel gas  pattern. Moderate stool burden. There are numerous surgical clips  overlying the abdomen and pelvis. Prior right hip arthroplasty. Prior left proximal femur fixation. There are mildly displaced fractures of the right superior and inferior pubic rami. Nondisplaced right sacral ala fracture best seen on same day CT. IMPRESSION: No evidence of bowel obstruction.  Moderate stool burden. Mildly displaced right superior and inferior pubic rami fractures. Nondisplaced right sacral ala fracture. Electronically Signed   By: Maurine Simmering M.D.   On: 04/09/2022 20:22   DG CHEST PORT 1 VIEW  Result Date: 04/09/2022 CLINICAL DATA:  Fall EXAM: PORTABLE CHEST 1 VIEW COMPARISON:  Radiograph 04/11/2019 FINDINGS: Unchanged cardiomediastinal silhouette. There is left costophrenic angle blunting suggesting trace effusion or pleural thickening. There is no focal airspace disease. No pneumothorax. There is a questionable nondisplaced fracture involving the anterior left eleventh rib. IMPRESSION: Questionable nondisplaced left anterior eleventh rib fracture. Left costophrenic angle blunting suggesting a trace effusion. Electronically Signed   By: Maurine Simmering M.D.   On: 04/09/2022 20:20   CT HEAD WO CONTRAST (5MM)  Result Date: 04/09/2022 CLINICAL DATA:  Head trauma, fall. EXAM: CT HEAD WITHOUT CONTRAST TECHNIQUE: Contiguous axial images were obtained from the base of the skull through the vertex without intravenous contrast. RADIATION DOSE REDUCTION: This exam was performed according to the departmental dose-optimization program which includes automated exposure control, adjustment of the mA and/or kV according to patient size and/or use of iterative reconstruction technique. COMPARISON:  None Available. FINDINGS: Brain: No acute intracranial hemorrhage, midline shift or mass effect. No extra-axial fluid collection. Mild diffuse atrophy is noted. Periventricular and subcortical white matter hypodensities are present bilaterally. No hydrocephalus. Vascular: No hyperdense vessel or unexpected  calcification. Skull: Normal. Negative for fracture or focal lesion. Sinuses/Orbits: No acute finding. Other: None. IMPRESSION: 1. No acute intracranial process. 2. Atrophy with chronic microvascular ischemic changes. Electronically Signed   By: Brett Fairy M.D.   On: 04/09/2022 20:08   CT PELVIS WO CONTRAST  Result Date: 04/09/2022 CLINICAL DATA:  Mechanical fall.  Evaluate for fracture. EXAM: CT PELVIS WITHOUT CONTRAST TECHNIQUE: Multidetector CT imaging of the pelvis was performed following the standard protocol without intravenous contrast. RADIATION DOSE REDUCTION: This exam was performed according to the departmental dose-optimization program which includes automated exposure control, adjustment of the mA and/or kV according to patient size and/or use of iterative reconstruction technique. COMPARISON:  04/09/2022 FINDINGS: Bones/Joint/Cartilage Acute nondisplaced fractures of the right superior and inferior pubic rami. Acute nondisplaced right sacral ala fracture. Slight buckling of the anterior cortex of the S2 vertebral body concerning for a subtle fracture. Right total hip arthroplasty without hardware failure or complication. No right hip fracture or dislocation. No left hip fracture or dislocation. Prior ORIF of a healed intertrochanteric fracture. Chronic L4 vertebral body compression fracture. Grade 1 anterolisthesis of L4 on L5 secondary to facet disease. Normal alignment. No joint effusion. Ligaments Ligaments are suboptimally evaluated by CT. Muscles and Tendons Muscles are normal. No muscle atrophy. No intramuscular fluid collection or hematoma. Soft tissue No fluid collection or hematoma.  No soft tissue mass. IMPRESSION: 1. Acute nondisplaced fractures of the right superior and inferior pubic rami. 2. Acute nondisplaced right sacral ala fracture. 3. Electronically Signed   By: Kathreen Devoid M.D.   On: 04/09/2022 17:32   DG Hip Unilat W or Wo Pelvis 2-3 Views Right  Result Date:  04/09/2022 CLINICAL DATA:  Fall. EXAM: DG HIP (WITH OR WITHOUT PELVIS) 2-3V RIGHT COMPARISON:  Right hip x-ray  12/05/2020 FINDINGS: There is a right hip arthroplasty in anatomic alignment. There is a left-sided hip screw and intramedullary nail. The bones are osteopenic. There is an acute minimally displaced fracture of the right superior pubic ramus and right inferior pubic ramus. Pubic symphysis appears intact. There is no hardware loosening. Multiple surgical clips overlie the pelvis. IMPRESSION: 1. Acute fractures of the right inferior and superior pubic rami. 2. No dislocation. 3. Right hip arthroplasty and left-sided hip screw appear uncomplicated. Electronically Signed   By: Ronney Asters M.D.   On: 04/09/2022 15:46   _______________________________________________________________________________________________________ Latest  Blood pressure (!) 158/67, pulse 90, temperature 97.9 F (36.6 C), temperature source Oral, resp. rate 14, height '5\' 2"'$  (1.575 m), weight 47.9 kg, SpO2 100 %.   Vitals  labs and radiology finding personally reviewed  Review of Systems:    Pertinent positives include: Fall  Constitutional:  No weight loss, night sweats, Fevers, chills, fatigue, weight loss  HEENT:  No headaches, Difficulty swallowing,Tooth/dental problems,Sore throat,  No sneezing, itching, ear ache, nasal congestion, post nasal drip,  Cardio-vascular:  No chest pain, Orthopnea, PND, anasarca, dizziness, palpitations.no Bilateral lower extremity swelling  GI:  No heartburn, indigestion, abdominal pain, nausea, vomiting, diarrhea, change in bowel habits, loss of appetite, melena, blood in stool, hematemesis Resp:  no shortness of breath at rest. No dyspnea on exertion, No excess mucus, no productive cough, No non-productive cough, No coughing up of blood.No change in color of mucus.No wheezing. Skin:  no rash or lesions. No jaundice GU:  no dysuria, change in color of urine, no urgency or  frequency. No straining to urinate.  No flank pain.  Musculoskeletal:  No joint pain or no joint swelling. No decreased range of motion. No back pain.  Psych:  No change in mood or affect. No depression or anxiety. No memory loss.  Neuro: no localizing neurological complaints, no tingling, no weakness, no double vision, no gait abnormality, no slurred speech, no confusion  All systems reviewed and apart from [place] all are negative _______________________________________________________________________________________________ Past Medical History:   Past Medical History:  Diagnosis Date   Breast cancer ([place])    Hyperlipidemia 12/29/2021   Hypertension    Hypervitaminosis D 12/29/2021   Hypothyroidism 12/29/2021   Intertrochanteric fracture of left hip ([place]) 04/11/2019   Iron deficiency anemia due to chronic blood loss 12/29/2021   Uterine cancer [facility])       Past Surgical History:  Procedure Laterality Date   ABDOMINAL HYSTERECTOMY     BIOPSY  12/30/2021   Procedure: BIOPSY;  Surgeon: Sharyn Creamer, MD;  Location: [facility];  Service: Gastroenterology;;   COLONOSCOPY     COLONOSCOPY WITH PROPOFOL N/A 12/30/2021   Procedure: COLONOSCOPY WITH PROPOFOL;  Surgeon: Sharyn Creamer, MD;  Location: [facility];  Service: Gastroenterology;  Laterality: N/A;   ESOPHAGOGASTRODUODENOSCOPY (EGD) WITH PROPOFOL N/A 12/30/2021   Procedure: ESOPHAGOGASTRODUODENOSCOPY (EGD) WITH PROPOFOL;  Surgeon: Sharyn Creamer, MD;  Location: [facility];  Service: Gastroenterology;  Laterality: N/A;   FEMUR IM NAIL Left 04/13/2019   Procedure: INTRAMEDULLARY (IM) NAIL FEMORAL;  Surgeon: Rod Can, MD;  Location: [facility];  Service: Orthopedics;  Laterality: Left;   POLYPECTOMY  12/30/2021   Procedure: POLYPECTOMY;  Surgeon: Sharyn Creamer, MD;  Location: [facility];  Service: Gastroenterology;;   TOTAL HIP ARTHROPLASTY Right 12/06/2020   Procedure: TOTAL HIP ARTHROPLASTY ANTERIOR APPROACH;  Surgeon: Rod Can, MD;  Location: [facility];  Service: Orthopedics;  Laterality: Right;  Social History:  Ambulatory    cane,       reports that she has never smoked. She has never used smokeless tobacco. She reports that she does not drink alcohol and does not use drugs.     Family History:   Family History  Problem Relation Age of Onset   Cancer Mother    Stomach cancer Neg Hx    Esophageal cancer Neg Hx    Colon cancer Neg Hx    Pancreatic cancer Neg Hx    ______________________________________________________________________________________________ Allergies: No Known Allergies   Prior to Admission medications   Medication Sig Start Date End Date Taking? Authorizing Provider  acetaminophen (TYLENOL) 325 MG tablet Take 1-2 tablets (325-650 mg total) by mouth every 6 (six) hours as needed for mild pain (pain score 1-3 or temp > 100.5). 12/08/20   Cristal Deer, MD  amLODipine (NORVASC) 5 MG tablet Take 5 mg by mouth daily.    [provider]  levothyroxine (SYNTHROID, LEVOTHROID) 75 MCG tablet Take 75 mcg by mouth daily before breakfast.    [provider]  menthol-cetylpyridinium (CEPACOL) 3 MG lozenge Take 1 lozenge (3 mg total) by mouth as needed for sore throat. 12/08/20   Cristal Deer, MD  oxymetazoline (AFRIN) 0.05 % nasal spray Place 1 spray into both nostrils daily as needed for congestion.    [provider]  pantoprazole (PROTONIX) 40 MG tablet Take 1 tablet (40 mg total) by mouth 2 (two) times daily before a meal. 01/26/22 04/20/22  Milus Banister, MD  Pediatric Multivitamins-Iron Vicki Mallet W/IRON PO) Take 2 tablets by mouth daily.    [provider]  raloxifene (EVISTA) 60 MG tablet Take 60 mg by mouth daily.    [provider]  simvastatin (ZOCOR) 5 MG tablet Take 5 mg by mouth every other day.    [provider]  telmisartan (MICARDIS) 80 MG tablet Take 80 mg by mouth daily. 10/30/20   [provider]     ___________________________________________________________________________________________________ Physical Exam:    04/09/2022    2:42 PM 04/09/2022    2:38 PM 03/17/2022    1:26 PM  Vitals with BMI  Height '5\' 2"'$   '5\' 2"'$   Weight 105 lbs 10 oz  105 lbs 10 oz  BMI 27.25  36.64  Systolic  403 474  Diastolic  67 66  Pulse  90 68     1. General:  in No  Acute distress    Chronically ill   -appearing 2. Psychological: Alert and   Oriented 3. Head/ENT:   Dry Mucous Membranes                          Head Non traumatic, neck supple                           Poor Dentition 4. SKIN:  decreased Skin turgor,  Skin clean Dry and intact no rash 5. Heart: Regular rate and rhythm no  Murmur, no Rub or gallop 6. Lungs  no wheezes occasional crackles   7. Abdomen: Soft,  non-tender, Non distended   obese  bowel sounds present 8. Lower extremities: no clubbing, cyanosis,   edema R > L 9. Neurologically Grossly intact, moving all 4 extremities equally   10. MSK: Normal range of motion    Chart has been reviewed  ______________________________________________________________________________________________  Assessment/Plan 88 y.o. female with medical history significant of dementia, and  iron deficiency anemia, history of GI blood loss, chronic gastric ulcer, HTN HLD, history of breast cancer, hypothyroidism, history of uterine cancer  Admitted for pelvis fractures  Present on Admission:  Pelvis fracture ([place])  Essential hypertension  Anemia  Breast cancer ([place])  Leg edema, right  Constipation  Lung field abnormal finding on examination  Closed rib fracture     Pelvis fracture ([place]) PT OT assessment may need placement for rehab Weightbearing as tolerated Orthopedics, follow-up as an outpatient in 2 weeks with Dr. Lyla Glassing  Essential hypertension Chronic stable continue Norvasc 5 mg and and Micardis 80 mg  Anemia Chronic stable followed by Dr. Alen Blew continue to monitor CBC  transfuse as needed  Breast cancer [facility]) Patient is followed by Dr. Alen Blew  History of gastric ulcer Continue PPI Protonix 40 mg twice a day  Leg edema, right Likely in setting of lymphedema.  Unclear if this is worse from baseline will obtain  Dopplers to rule evaluate for DVT  Constipation Order KUB and bowel regimen  Lung field abnormal finding on examination Obtain chest x-ray to further evaluate  Fall at home, initial encounter Will need PT OT prior to discharge may need admission to rehab.  Given fall and reported head injury will obtain CT head to further evaluate  Closed rib fracture Supportive management    Other plan as per orders.  DVT prophylaxis:   Lovenox       Code Status:    Code Status: Prior FULL CODE  as per patient   I had personally discussed CODE STATUS with patient      Family Communication:   Family not at  Bedside    Disposition Plan:     likely will need placement for rehabilitation                           Following barriers for discharge:                                                        Pain controlled with PO medications                                                      Would benefit from PT/OT eval prior to DC  Ordered                   Swallow eval - SLP ordered                                     Transition of care consulted                                      Consults called: Orthopedics is aware recommend follow-up as an outpatient  Admission status:  ED Disposition     ED Disposition  Admit   Condition  --   [place]: [place] [[id number]]  Level of Care: Med-Surg [16]  May place patient in observation at [facility] or [place] if equivalent level of care is available:: No  Covid Evaluation: Asymptomatic - no recent exposure (last 10 days) testing not required  Diagnosis: Pelvis fracture [facility]) [[id number]]  Admitting Physician: Toy Baker [[id number]]  Attending  Physician: Toy Baker [[id number]]           Obs      Level of care    medical floor        Lab Results  Component Value Date   [place] NEGATIVE 12/29/2021     Cire Deyarmin 04/09/2022, 7:50 PM    [facility]     after 2 AM please page floor coverage PA If 7AM-7PM, please contact the day team taking care of the patient using [email or web address]   Patient was evaluated in the context of the global COVID-19 pandemic, which necessitated consideration that the patient might be at risk for infection with the SARS-CoV-2 virus that causes COVID-19. Institutional protocols and algorithms that pertain to the evaluation of patients at risk for COVID-19 are in a state of rapid change based on information released by regulatory bodies including the CDC and federal and state organizations. These policies and algorithms were followed during the patient's care.

## 2022-04-09 NOTE — Consult Note (Signed)
History: This a pleasant 88 year old woman who unfortunately had a mechanical fall at home.  She was brought by EMS because of pelvic pain.  Imaging studies demonstrated pelvic fractures and orthopedic consultation was requested.  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   Current Outpatient Medications on File Prior to Encounter  Medication Sig Dispense Refill   acetaminophen (TYLENOL) 325 MG tablet Take 1-2 tablets (325-650 mg total) by mouth every 6 (six) hours as needed for mild pain (pain score 1-3 or temp > 100.5). 60 tablet 0   amLODipine (NORVASC) 5 MG tablet Take 5 mg by mouth daily.     levothyroxine (SYNTHROID, LEVOTHROID) 75 MCG tablet Take 75 mcg by mouth daily before breakfast.     menthol-cetylpyridinium (CEPACOL) 3 MG lozenge Take 1 lozenge (3 mg total) by mouth as needed for sore throat. 100 tablet 12   oxymetazoline (AFRIN) 0.05 % nasal spray Place 1 spray into both nostrils daily as needed for congestion.     pantoprazole (PROTONIX) 40 MG tablet Take 1 tablet (40 mg total) by mouth 2 (two) times daily before a meal. 168 tablet 0   Pediatric Multivitamins-Iron (FLINTSTONES W/IRON PO) Take 2 tablets by mouth daily.     raloxifene (EVISTA) 60 MG tablet Take 60 mg by mouth daily.     simvastatin (ZOCOR) 5 MG tablet Take 5 mg by mouth every other day.     telmisartan (MICARDIS) 80 MG tablet Take 80 mg by mouth daily.      Physical Exam: Vitals:   04/09/22 1438  BP: (!) 158/67  Pulse: 90  Resp: 14  Temp: 97.9 F (36.6 C)  SpO2: 100%   Body mass index is 19.31 kg/m. Patient has underlying dementia  but she is alert and oriented. No shortness of breath or chest pain at present. Positive anterior pelvic pain with palpation.  Pain radiates posteriorly into the right gluteal region. EHL/tibialis anterior/gastrocnemius: 5/5 in the lower extremity.  Intact sensation light touch throughout the lower extremity.  2+ dorsalis pedis/posterior tibialis pulses bilaterally.   Compartments are soft and nontender.    No significant, knee, ankle pain with isolated joint range of motion.  Image: CT PELVIS WO CONTRAST  Result Date: 04/09/2022 CLINICAL DATA:  Mechanical fall.  Evaluate for fracture. EXAM: CT PELVIS WITHOUT CONTRAST TECHNIQUE: Multidetector CT imaging of the pelvis was performed following the standard protocol without intravenous contrast. RADIATION DOSE REDUCTION: This exam was performed according to the departmental dose-optimization program which includes automated exposure control, adjustment of the mA and/or kV according to patient size and/or use of iterative reconstruction technique. COMPARISON:  04/09/2022 FINDINGS: Bones/Joint/Cartilage Acute nondisplaced fractures of the right superior and inferior pubic rami. Acute nondisplaced right sacral ala fracture. Slight buckling of the anterior cortex of the S2 vertebral body concerning for a subtle fracture. Right total hip arthroplasty without hardware failure or complication. No right hip fracture or dislocation. No left hip fracture or dislocation. Prior ORIF of a healed intertrochanteric fracture. Chronic L4 vertebral body compression fracture. Grade 1 anterolisthesis of L4 on L5 secondary to facet disease. Normal alignment. No  joint effusion. Ligaments Ligaments are suboptimally evaluated by CT. Muscles and Tendons Muscles are normal. No muscle atrophy. No intramuscular fluid collection or hematoma. Soft tissue No fluid collection or hematoma.  No soft tissue mass. IMPRESSION: 1. Acute nondisplaced fractures of the right superior and inferior  pubic rami. 2. Acute nondisplaced right sacral ala fracture. 3. Electronically Signed   By: Kathreen Devoid M.D.   On: 04/09/2022 17:32   DG Hip Unilat W or Wo Pelvis 2-3 Views Right  Result Date: 04/09/2022 CLINICAL DATA:  Fall. EXAM: DG HIP (WITH OR WITHOUT PELVIS) 2-3V RIGHT COMPARISON:  Right hip x-ray 12/05/2020 FINDINGS: There is a right hip arthroplasty in anatomic alignment. There is a left-sided hip screw and intramedullary nail. The bones are osteopenic. There is an acute minimally displaced fracture of the right superior pubic ramus and right inferior pubic ramus. Pubic symphysis appears intact. There is no hardware loosening. Multiple surgical clips overlie the pelvis. IMPRESSION: 1. Acute fractures of the right inferior and superior pubic rami. 2. No dislocation. 3. Right hip arthroplasty and left-sided hip screw appear uncomplicated. Electronically Signed   By: Ronney Asters M.D.   On: 04/09/2022 15:46    A/P: She is a pleasant 88 year old woman who had a mechanical fall in the complaints of low back pain.  She has had prior right hip arthroplasty as well as a left-sided hip screw intramedullary nail by my partner Dr. Lyla Glassing.  Patient was at home doing well when she unfortunately fell.  CT scan demonstrates chronic degenerative anterior listhesis L4-5.  Acute nondisplaced fractures of the right superior and inferior pubic rami.  Acute nondisplaced right sacral ala fracture.  At this point time patient will be admitted by the medical service for pain medical management.  Surgical intervention is not required.  Patient can weight-bear as tolerated with a walker and will follow-up with Dr. Lyla Glassing in 2 weeks.  If there is any questions or concerns please not hesitate to contact me.    [phone]

## 2022-04-09 NOTE — Assessment & Plan Note (Addendum)
Chronic stable followed by Dr. Alen Blew continue to monitor CBC transfuse as needed

## 2022-04-10 ENCOUNTER — Observation Stay (HOSPITAL_BASED_OUTPATIENT_CLINIC_OR_DEPARTMENT_OTHER): Payer: Medicare PPO

## 2022-04-10 DIAGNOSIS — S3282XA Multiple fractures of pelvis without disruption of pelvic ring, initial encounter for closed fracture: Secondary | ICD-10-CM | POA: Diagnosis not present

## 2022-04-10 DIAGNOSIS — S3282XD Multiple fractures of pelvis without disruption of pelvic ring, subsequent encounter for fracture with routine healing: Secondary | ICD-10-CM | POA: Diagnosis not present

## 2022-04-10 DIAGNOSIS — R609 Edema, unspecified: Secondary | ICD-10-CM

## 2022-04-10 DIAGNOSIS — S32810A Multiple fractures of pelvis with stable disruption of pelvic ring, initial encounter for closed fracture: Secondary | ICD-10-CM

## 2022-04-10 DIAGNOSIS — I10 Essential (primary) hypertension: Secondary | ICD-10-CM | POA: Diagnosis not present

## 2022-04-10 DIAGNOSIS — K5900 Constipation, unspecified: Secondary | ICD-10-CM | POA: Diagnosis not present

## 2022-04-10 LAB — COMPREHENSIVE METABOLIC PANEL
ALT: 18 U/L (ref 0–44)
AST: 23 U/L (ref 15–41)
Albumin: 3.2 g/dL — ABNORMAL LOW (ref 3.5–5.0)
Alkaline Phosphatase: 39 U/L (ref 38–126)
Anion gap: 4 — ABNORMAL LOW (ref 5–15)
BUN: 23 mg/dL (ref 8–23)
CO2: 26 mmol/L (ref 22–32)
Calcium: 9.2 mg/dL (ref 8.9–10.3)
Chloride: 109 mmol/L (ref 98–111)
Creatinine, Ser: 0.76 mg/dL (ref 0.44–1.00)
GFR, Estimated: >60 mL/min (ref >60)
Glucose, Bld: 115 mg/dL — ABNORMAL HIGH (ref 70–99)
Potassium: 3.7 mmol/L (ref 3.5–5.1)
Sodium: 139 mmol/L (ref 135–145)
Total Bilirubin: 0.8 mg/dL (ref 0.3–1.2)
Total Protein: 5.5 g/dL — ABNORMAL LOW (ref 6.5–8.1)

## 2022-04-10 LAB — CBC WITH DIFFERENTIAL/PLATELET
Abs Immature Granulocytes: 0.04 10*3/uL (ref 0.00–0.07)
Basophils Absolute: 0 10*3/uL (ref 0.0–0.1)
Basophils Relative: 0 %
Eosinophils Absolute: 0 10*3/uL (ref 0.0–0.5)
Eosinophils Relative: 0 %
HCT: 31.9 % — ABNORMAL LOW (ref 36.0–46.0)
Hemoglobin: 10.5 g/dL — ABNORMAL LOW (ref 12.0–15.0)
Immature Granulocytes: 1 %
Lymphocytes Relative: 15 %
Lymphs Abs: 1.2 10*3/uL (ref 0.7–4.0)
MCH: 29.8 pg (ref 26.0–34.0)
MCHC: 32.9 g/dL (ref 30.0–36.0)
MCV: 90.6 fL (ref 80.0–100.0)
Monocytes Absolute: 0.7 10*3/uL (ref 0.1–1.0)
Monocytes Relative: 9 %
Neutro Abs: 6.3 10*3/uL (ref 1.7–7.7)
Neutrophils Relative %: 75 %
Platelets: 238 10*3/uL (ref 150–400)
RBC: 3.52 MIL/uL — ABNORMAL LOW (ref 3.87–5.11)
RDW: 16.7 % — ABNORMAL HIGH (ref 11.5–15.5)
WBC: 8.3 10*3/uL (ref 4.0–10.5)
nRBC: 0 % (ref 0.0–0.2)

## 2022-04-10 LAB — VITAMIN D 25 HYDROXY (VIT D DEFICIENCY, FRACTURES): Vit D, 25-Hydroxy: 52.11 ng/mL (ref 30–100)

## 2022-04-10 LAB — PREALBUMIN: Prealbumin: 17.7 mg/dL — ABNORMAL LOW (ref 18–38)

## 2022-04-10 MED ORDER — POLYETHYLENE GLYCOL 3350 17 G PO PACK: 17.0000 g | PACK | Freq: Every day | ORAL | 0 refills | Status: AC | PRN

## 2022-04-10 MED ORDER — HYDROCODONE-ACETAMINOPHEN 5-325 MG PO TABS
1.0000 | ORAL_TABLET | Freq: Four times a day (QID) | ORAL | 0 refills | Status: AC | PRN
Start: 2022-04-10 — End: ?

## 2022-04-10 MED ORDER — SENNA 8.6 MG PO TABS: 1.0000 | ORAL_TABLET | Freq: Every day | ORAL | 0 refills | Status: DC | PRN

## 2022-04-10 NOTE — Progress Notes (Signed)
Nutrition Note  RD consulted via hip fracture protocol.   Pt in room, son at bedside. Pt eating lunch and reports good appetite. States "I have a good cook at home". Reports stable weight. No needs identified. Noted, pt to discharge today.  Wt Readings from Last 15 Encounters:  04/09/22 47.7 kg  03/17/22 47.9 kg  03/03/22 38.1 kg  01/26/22 38.1 kg  01/08/22 47.7 kg  12/29/21 47.8 kg  12/05/20 45.4 kg  04/13/19 50 kg  02/19/16 46.7 kg    Body mass index is 19.23 kg/m. Patient meets criteria for normal based on current BMI.   Current diet order is heart healthy, patient is consuming approximately 100% of meals at this time. Labs and medications reviewed.   No nutrition interventions warranted at this time. If nutrition issues arise, please consult RD.   Clayton Bibles, MS, RD, LDN Inpatient Clinical Dietitian Contact information available via Amion

## 2022-04-10 NOTE — Progress Notes (Signed)
Right lower extremity venous duplex has been completed. Preliminary results can be found in CV Proc through chart review.   04/10/22 10:15 AM Cheryl Maldonado RVT

## 2022-04-10 NOTE — Evaluation (Signed)
Physical Therapy Evaluation Patient Details Name: Cheryl Maldonado MRN: [id number] DOB: Nov 29, 1932 Today's Date: 04/10/2022  History of Present Illness  Pt is an 88 y.o. female presenting to ED following mechanical fall at home found to have acute nondisplaced fractures of the right superior and inferior pubic rami,  right sacral ala. PMH significant for dementia, and iron deficiency anemia, history of GI blood loss, chronic gastric ulcer, HTN HLD, history of breast cancer, hypothyroidism, history of uterine cancer,  R THA (2022), and L femoral IM nail (2020).   Clinical Impression  Pt is an 88 y.o. female with above HPI resulting in the deficits listed below (see PT Problem List). Pt has [place] aide Sunday-Thursday from 9am-5pm at baseline who assists with cooking meals, home and medication management. Pt reports she is able to perform dressing/bathing/toileting tasks on her own and uses SPC in community. Pt performed sit to stand transfers with MIN guard for safety and cues for safe hand placement. Pt ambulated total of ~86f with MIN A progressing to MIN guard. Pt's son present during session and reports that they are arranging for family members to stay with pt overnight and Fridays when pt is typically alone to provide increased supervision/assist upon d/c. Pt will benefit from skilled PT to maximize functional mobility and increase independence.         Recommendations for follow up therapy are one component of a multi-disciplinary discharge planning process, led by the attending physician.  Recommendations may be updated based on patient status, additional functional criteria and insurance authorization.  Follow Up Recommendations Home health PT    Assistance Recommended at Discharge Frequent or constant Supervision/Assistance  Patient can return home with the following  A little help with walking and/or transfers;A little help with bathing/dressing/bathroom;Help with stairs or ramp for  entrance;Assist for transportation;Direct supervision/assist for financial management;Direct supervision/assist for medications management;Assistance with cooking/housework    Equipment Recommendations None recommended by PT (pt owns RW)  Recommendations for Other Services       Functional Status Assessment Patient has had a recent decline in their functional status and demonstrates the ability to make significant improvements in function in a reasonable and predictable amount of time.     Precautions / Restrictions Precautions Precautions: Fall Restrictions Weight Bearing Restrictions: Yes RLE Weight Bearing: Weight bearing as tolerated      Mobility  Bed Mobility Overal bed mobility: Needs Assistance Bed Mobility: Supine to Sit     Supine to sit: Min assist, HOB elevated     General bed mobility comments: use of bed rails, assist to bring trunk to upright, able to progress LEs    Transfers Overall transfer level: Needs assistance Equipment used: Rolling walker (2 wheels) Transfers: Sit to/from Stand Sit to Stand: Min guard           General transfer comment: MIN guard for safety, increased time to rise. Cues for hand placement, pt attempting to use B UEs to pull up on RW    Ambulation/Gait Ambulation/Gait assistance: Min assist, Min guard Gait Distance (Feet): 40 Feet Assistive device: Rolling walker (2 wheels) Gait Pattern/deviations: Step-through pattern, Decreased stride length, Trunk flexed Gait velocity: decr     General Gait Details: no overt LOB observed, MIN A progressing to MIN guard for safety. Slow speed and decreased stride length, some reported discomfort with ambulation.  Stairs            Wheelchair Mobility    Modified Rankin (Stroke Patients Only)  Balance Overall balance assessment: Needs assistance Sitting-balance support: Feet supported Sitting balance-Leahy Scale: Fair     Standing balance support: Bilateral upper  extremity supported, During functional activity Standing balance-Leahy Scale: Fair                               Pertinent Vitals/Pain Pain Assessment Pain Assessment: Faces Faces Pain Scale: Hurts little more Pain Location: back and R LE Pain Descriptors / Indicators: Aching, Discomfort Pain Intervention(s): Limited activity within patient's tolerance, Monitored during session, Repositioned    Home Living Family/patient expects to be discharged to:: Private residence Living Arrangements: Alone Available Help at Discharge: Family;Personal care attendant Type of Home: House Home Access: Stairs to enter Entrance Stairs-Rails: Psychiatric nurse of Steps: 3   Home Layout: One level Home Equipment: Conservation officer, nature (2 wheels);Cane - single point;Shower seat;BSC/3in1 Additional Comments: Son present toward end of session states that family is planning to be there overnight and on fridays when aide is not there. Has HH aide SUN-Thurs 9am-5pm. Pt reports history of falls in past.    Prior Function Prior Level of Function : Needs assist;Independent/Modified Independent  Cognitive Assist : ADLs (cognitive)           Mobility Comments: SPC in community, no AD in household prior to coming to hospital ADLs Comments: pt reports ind with bathing, dressing, toileting, aide completes household chores, helps with medication management and cooking meals. Pt cooks her own meals on fridays, uses microwave and oven.     Hand Dominance   Dominant Hand: Right    Extremity/Trunk Assessment   Upper Extremity Assessment Upper Extremity Assessment: Defer to OT evaluation    Lower Extremity Assessment Lower Extremity Assessment: Generalized weakness       Communication   Communication: No difficulties  Cognition Arousal/Alertness: Awake/alert Behavior During Therapy: WFL for tasks assessed/performed Overall Cognitive Status: History of cognitive impairments - at  baseline                                 General Comments: Pt able to answer home and PLOF questions upon eval, son present to confirm as well        General Comments      Exercises     Assessment/Plan    PT Assessment Patient needs continued PT services  PT Problem List Decreased strength;Decreased range of motion;Decreased activity tolerance;Decreased balance;Decreased mobility;Pain       PT Treatment Interventions DME instruction;Gait training;Stair training;Functional mobility training;Therapeutic activities;Therapeutic exercise;Balance training;Patient/family education    PT Goals (Current goals can be found in the Care Plan section)  Acute Rehab PT Goals Patient Stated Goal: Feel better and go back home PT Goal Formulation: With patient/family Time For Goal Achievement: 04/24/22 Potential to Achieve Goals: Good    Frequency Min 3X/week     Co-evaluation               AM-PAC PT "6 Clicks" Mobility  Outcome Measure Help needed turning from your back to your side while in a flat bed without using bedrails?: A Little Help needed moving from lying on your back to sitting on the side of a flat bed without using bedrails?: A Lot Help needed moving to and from a bed to a chair (including a wheelchair)?: A Little Help needed standing up from a chair using your arms (e.g., wheelchair or bedside chair)?:  A Little Help needed to walk in hospital room?: A Little Help needed climbing 3-5 steps with a railing? : A Lot 6 Click Score: 16    End of Session Equipment Utilized During Treatment: Gait belt Activity Tolerance: Patient tolerated treatment well Patient left: in chair;with call bell/phone within reach;with chair alarm set;with family/visitor present Nurse Communication: Mobility status PT Visit Diagnosis: Unsteadiness on feet (R26.81);Muscle weakness (generalized) (M62.81)    Time: 9381-8299 PT Time Calculation (min) (ACUTE ONLY): 26  min   Charges:   PT Evaluation $PT Eval Low Complexity: 1 Low PT Treatments $Therapeutic Activity: 8-22 mins       Festus Barren PT, DPT  Acute Rehabilitation Services  Office [phone] 04/10/2022, 11:23 AM

## 2022-04-10 NOTE — Evaluation (Signed)
Occupational Therapy Evaluation Patient Details Name: Cheryl Maldonado MRN: [id number] DOB: 10/01/1933 Today's Date: 04/10/2022   History of Present Illness Pt is an 88 y.o. female presenting to ED following mechanical fall at home found to have acute nondisplaced fractures of the right superior and inferior pubic rami,  right sacral ala. PMH significant for dementia, and iron deficiency anemia, history of GI blood loss, chronic gastric ulcer, HTN HLD, history of breast cancer, hypothyroidism, history of uterine cancer,  R THA (2022), and L femoral IM nail (2020).   Clinical Impression   Pt admitted with the above diagnoses and presents with below problem list. Pt will benefit from continued acute OT to address the below listed deficits and maximize independence with basic ADLs prior to d/c home with caregivers. At baseline, pt is mod I to supervision with ADLs. Pt currently needs up to min A with functional transfers, close min guard with functional mobility, mod A with LB ADLs and pericare in sit<>stand.       Recommendations for follow up therapy are one component of a multi-disciplinary discharge planning process, led by the attending physician.  Recommendations may be updated based on patient status, additional functional criteria and insurance authorization.   Follow Up Recommendations  Home health OT    Assistance Recommended at Discharge Frequent or constant Supervision/Assistance  Patient can return home with the following A little help with walking and/or transfers;A little help with bathing/dressing/bathroom;Assistance with cooking/housework;Direct supervision/assist for medications management;Direct supervision/assist for financial management;Assist for transportation;Help with stairs or ramp for entrance    Functional Status Assessment  Patient has had a recent decline in their functional status and demonstrates the ability to make significant improvements in function in a reasonable  and predictable amount of time.  Equipment Recommendations  None recommended by OT    Recommendations for Other Services       Precautions / Restrictions Precautions Precautions: Fall Restrictions Weight Bearing Restrictions: Yes RLE Weight Bearing: Weight bearing as tolerated      Mobility Bed Mobility Overal bed mobility: Needs Assistance Bed Mobility: Supine to Sit     Supine to sit: Min assist, HOB elevated     General bed mobility comments: use of bed rails, assist to bring trunk to upright, able to progress LEs. decreased initiation on first attempt.    Transfers Overall transfer level: Needs assistance Equipment used: Rolling walker (2 wheels) Transfers: Sit to/from Stand Sit to Stand: Min guard           General transfer comment: min guard for safety, min A to control descent after walking short distance in the room      Balance Overall balance assessment: Needs assistance Sitting-balance support: Feet supported Sitting balance-Leahy Scale: Fair     Standing balance support: Bilateral upper extremity supported, During functional activity Standing balance-Leahy Scale: Fair                             ADL either performed or assessed with clinical judgement   ADL Overall ADL's : Needs assistance/impaired Eating/Feeding: Set up;Sitting   Grooming: Minimal assistance   Upper Body Bathing: Minimal assistance   Lower Body Bathing: Moderate assistance   Upper Body Dressing : Minimal assistance;Sitting   Lower Body Dressing: Moderate assistance;Sit to/from stand   Toilet Transfer: Minimal assistance;Ambulation   Toileting- Clothing Manipulation and Hygiene: Minimal assistance;Moderate assistance;Sit to/from stand   Tub/ Shower Transfer: Walk-in shower;Minimal assistance   Functional mobility during  ADLs: Min guard;Minimal assistance;Rolling walker (2 wheels) General ADL Comments: supported sitting recommended for UB ADLs. Min A to  steady during transitions and turns.     Vision         Perception     Praxis      Pertinent Vitals/Pain Pain Assessment Pain Assessment: Faces Faces Pain Scale: Hurts little more Pain Location: "my back" grimcing with R hip abduction at bed level. Pain Descriptors / Indicators: Aching, Discomfort, Grimacing, Guarding Pain Intervention(s): Monitored during session, Repositioned, Limited activity within patient's tolerance     Hand Dominance Right   Extremity/Trunk Assessment Upper Extremity Assessment Upper Extremity Assessment: Generalized weakness   Lower Extremity Assessment Lower Extremity Assessment: Defer to PT evaluation       Communication Communication Communication: No difficulties   Cognition Arousal/Alertness: Awake/alert Behavior During Therapy: WFL for tasks assessed/performed Overall Cognitive Status: History of cognitive impairments - at baseline                                       General Comments       Exercises     Shoulder Instructions      Home Living Family/patient expects to be discharged to:: Private residence Living Arrangements: Alone Available Help at Discharge: Family;Personal care attendant Type of Home: House Home Access: Stairs to enter [employer] of Steps: 3 Entrance Stairs-Rails: Right;Left Home Layout: One level     Bathroom Shower/Tub: Walk-in shower;Door     Bathroom Accessibility: Yes   Home Equipment: Conservation officer, nature (2 wheels);Cane - single point;Shower seat;BSC/3in1   Additional Comments: Son present toward end of session states that family is planning to be there overnight and on fridays when aide is not there. Has HH aide SUN-Thurs 9am-5pm. Pt reports history of falls in past.      Prior Functioning/Environment Prior Level of Function : Needs assist;Independent/Modified Independent  Cognitive Assist : ADLs (cognitive)           Mobility Comments: SPC in community, no AD  in household prior to coming to hospital ADLs Comments: pt reports ind with bathing, dressing, toileting, aide completes household chores, helps with medication management and cooking meals. Pt cooks her own meals on fridays, uses microwave and oven.        OT Problem List: Decreased strength;Decreased activity tolerance;Impaired balance (sitting and/or standing);Decreased cognition;Decreased safety awareness;Decreased knowledge of use of DME or AE;Decreased knowledge of precautions;Pain      OT Treatment/Interventions: Self-care/ADL training;Energy conservation;DME and/or AE instruction;Therapeutic activities;Patient/family education;Balance training    OT Goals(Current goals can be found in the care plan section) Acute Rehab OT Goals Patient Stated Goal: not stated OT Goal Formulation: With patient Time For Goal Achievement: 04/24/22 Potential to Achieve Goals: Good ADL Goals Pt Will Perform Lower Body Bathing: with min guard assist;sit to/from stand Pt Will Perform Lower Body Dressing: with min guard assist;sit to/from stand Pt Will Transfer to Toilet: with supervision;ambulating Pt Will Perform Toileting - Clothing Manipulation and hygiene: with min guard assist;sit to/from stand Pt Will Perform Tub/Shower Transfer: Shower transfer;with min guard assist;ambulating;shower seat;rolling walker  OT Frequency: Min 2X/week    Co-evaluation              AM-PAC OT "6 Clicks" Daily Activity     Outcome Measure Help from another person eating meals?: A Little Help from another person taking care of personal grooming?: A Little Help from another  person toileting, which includes using toliet, bedpan, or urinal?: A Little Help from another person bathing (including washing, rinsing, drying)?: A Little Help from another person to put on and taking off regular upper body clothing?: A Little Help from another person to put on and taking off regular lower body clothing?: A Little 6 Click  Score: 18   End of Session Equipment Utilized During Treatment: Rolling walker (2 wheels)  Activity Tolerance: Patient limited by fatigue;Patient tolerated treatment well Patient left: in bed;with call bell/phone within reach;with bed alarm set;with family/visitor present  OT Visit Diagnosis: Unsteadiness on feet (R26.81);History of falling (Z91.81);Muscle weakness (generalized) (M62.81);Pain;Other symptoms and signs involving cognitive function                Time: 8676-1950 OT Time Calculation (min): 27 min Charges:  OT General Charges $OT Visit: 1 Visit OT Evaluation $OT Eval Moderate Complexity: 1 Mod OT Treatments $Self Care/Home Management : 8-22 mins  Tyrone Schimke, OT Acute Rehabilitation Services Office: [phone]   Hortencia Pilar 04/10/2022, 2:09 PM

## 2022-04-10 NOTE — Evaluation (Signed)
Clinical/Bedside Swallow Evaluation Patient Details  Name: Madelyne Millikan MRN: [id number] Date of Birth: Mar 20, 1933  Today's Date: 04/10/2022 Time: SLP Start Time (ACUTE ONLY): 36 SLP Stop Time (ACUTE ONLY): 1125 SLP Time Calculation (min) (ACUTE ONLY): 20 min  Past Medical History:  Past Medical History:  Diagnosis Date   Breast cancer ([place])    Hyperlipidemia 12/29/2021   Hypertension    Hypervitaminosis D 12/29/2021   Hypothyroidism 12/29/2021   Intertrochanteric fracture of left hip ([place]) 04/11/2019   Iron deficiency anemia due to chronic blood loss 12/29/2021   Uterine cancer [facility])    Past Surgical History:  Past Surgical History:  Procedure Laterality Date   ABDOMINAL HYSTERECTOMY     BIOPSY  12/30/2021   Procedure: BIOPSY;  Surgeon: Sharyn Creamer, MD;  Location: [facility];  Service: Gastroenterology;;   COLONOSCOPY     COLONOSCOPY WITH PROPOFOL N/A 12/30/2021   Procedure: COLONOSCOPY WITH PROPOFOL;  Surgeon: Sharyn Creamer, MD;  Location: [facility];  Service: Gastroenterology;  Laterality: N/A;   ESOPHAGOGASTRODUODENOSCOPY (EGD) WITH PROPOFOL N/A 12/30/2021   Procedure: ESOPHAGOGASTRODUODENOSCOPY (EGD) WITH PROPOFOL;  Surgeon: Sharyn Creamer, MD;  Location: [facility];  Service: Gastroenterology;  Laterality: N/A;   FEMUR IM NAIL Left 04/13/2019   Procedure: INTRAMEDULLARY (IM) NAIL FEMORAL;  Surgeon: Rod Can, MD;  Location: [facility];  Service: Orthopedics;  Laterality: Left;   POLYPECTOMY  12/30/2021   Procedure: POLYPECTOMY;  Surgeon: Sharyn Creamer, MD;  Location: [facility];  Service: Gastroenterology;;   TOTAL HIP ARTHROPLASTY Right 12/06/2020   Procedure: TOTAL HIP ARTHROPLASTY ANTERIOR APPROACH;  Surgeon: Rod Can, MD;  Location: [facility];  Service: Orthopedics;  Laterality: Right;   HPI:  88 yo female with h/o dementia adm with fall, ortho states pt does not need surgery and thus she is medically managed.  Order for swallow eval received. Pt has h/o GERD,  Barrett's esophagus, large ulcer.  She is s/p EGDs 12/2021, 03/2022.  Denies dysphagia.    Assessment / Plan / Recommendation  Clinical Impression  Pt presents with functional oropharyngeal swallow - no indication of dysphagia or aspiration.  Pt does have h/o GERD, Hiatal hernai, Barrett's and thus provided her with compensation strategies.  Recommend continue regular/thin diet with general precautions. Advised pt to use her IS 10s an hour but before meals.  No SLP follow up indicated. Set up pt for brushing independently. SLP Visit Diagnosis: Dysphagia, unspecified (R13.10)    Aspiration Risk  Mild aspiration risk    Diet Recommendation Regular;Thin liquid   Liquid Administration via: Cup;Straw Medication Administration: Whole meds with liquid Supervision: Patient able to self feed Compensations: Slow rate;Small sips/bites;Follow solids with liquid Postural Changes: Seated upright at 90 degrees;Remain upright for at least 30 minutes after po intake    Other  Recommendations Oral Care Recommendations: Oral care BID    Recommendations for follow up therapy are one component of a multi-disciplinary discharge planning process, led by the attending physician.  Recommendations may be updated based on patient status, additional functional criteria and insurance authorization.  Follow up Recommendations No SLP follow up      Assistance Recommended at Discharge None  Functional Status Assessment Patient has not had a recent decline in their functional status  Frequency and Duration     N/a       Prognosis    N/a    Swallow Study   General Date of Onset: 04/10/22 HPI: 88 yo female with h/o dementia adm with fall, ortho states  pt does not need surgery and thus she is medically managed.  Order for swallow eval received. Pt has h/o GERD, Barrett's esophagus, large ulcer.  She is s/p EGDs 12/2021, 03/2022.  Denies dysphagia. Type of Study: Bedside Swallow Evaluation Diet Prior to this Study:  Regular;Thin liquids Temperature Spikes Noted: No Respiratory Status: Room air History of Recent Intubation: No Behavior/Cognition: Alert;Cooperative;Pleasant mood Oral Cavity Assessment: Within Functional Limits Oral Care Completed by SLP: No Oral Cavity - Dentition: Adequate natural dentition Vision: Functional for self-feeding Self-Feeding Abilities: Able to feed self Patient Positioning: Upright in bed Baseline Vocal Quality: Normal Volitional Cough: Strong Volitional Swallow: Able to elicit    Oral/Motor/Sensory Function Overall Oral Motor/Sensory Function: Within functional limits   Ice Chips Ice chips: Not tested   Thin Liquid Thin Liquid: Within functional limits Presentation: Straw    Nectar Thick Nectar Thick Liquid: Not tested   Honey Thick Honey Thick Liquid: Not tested   Puree Puree: Within functional limits Presentation: Self Fed;Spoon   Solid     Solid: Within functional limits Presentation: Self Fredirick Lathe 04/10/2022,11:35 AM   Kathleen Lime, MS [place] Office [phone] Pager [phone]

## 2022-04-10 NOTE — Discharge Summary (Signed)
Physician Discharge Summary   Patient: Cheryl Maldonado MRN: [id number] DOB: Dec 26, 1932  Admit date:     04/09/2022  Discharge date: 04/10/22  Discharge Physician: Oswald Hillock   PCP: Holland Commons, FNP   Recommendations at discharge:   Follow-up orthopedics in 2 weeks  Discharge Diagnoses: Principal Problem:   Pelvis fracture ([place]) Active Problems:   Essential hypertension   Anemia   Breast cancer ([place])   History of gastric ulcer   Leg edema, right   Constipation   Lung field abnormal finding on examination   Fall at home, initial encounter   Closed rib fracture  Resolved Problems:   * No resolved hospital problems. *  Hospital Course:  88 year old female with medical history of dementia, iron-deficiency anemia, history of GI bleed, chronic gastric ulcer, hypertension, hyperlipidemia, history of breast cancer, hypothyroidism, history of uterine cancer presented with mechanical fall.  Patient developed severe pelvic pain after fall.  Unable to ambulate.  In the ED imaging showed acute fracture of right superior and inferior pubic rami.  Confirmed by CT of the pelvis.   Assessment and Plan:  Pelvic fractures -CT confirmed fracture of right superior and inferior pubic rami -PT saw the patient, recommend home health PT -Weightbearing as tolerated -Orthopedics Dr. Lyla Glassing will see patient in 2 weeks -We will discharge on Vicodin 5/325 1 to 2 tablets every 6 hours as needed for pain  Constipation -Continue MiraLAX 17 g daily as needed -Senokot-S tablet daily as needed  Rib fracture -Seen on chest x-ray -Pain management  Chronic lymphedema right leg -Venous duplex of lower extremity was negative for DVT  History of gastric ulcers -Continue Protonix  History of breast cancer -Followed by Dr. Alen Blew as outpatient  Hypertension -Blood pressure controlled -Continue Micardis, Norvasc           Consultants:  Procedures performed:  Disposition: Home Diet  recommendation:  Discharge Diet Orders (From admission, onward)     Start     Ordered   04/10/22 0000  Diet - low sodium heart healthy        04/10/22 1328           Cardiac diet DISCHARGE MEDICATION: Allergies as of 04/10/2022   No Known Allergies      Medication List     STOP taking these medications    acetaminophen 325 MG tablet Commonly known as: TYLENOL       TAKE these medications    amLODipine 5 MG tablet Commonly known as: NORVASC Take 5 mg by mouth daily.   FLINTSTONES W/IRON PO Take 2 tablets by mouth daily.   HYDROcodone-acetaminophen 5-325 MG tablet Commonly known as: NORCO/VICODIN Take 1-2 tablets by mouth every 6 (six) hours as needed for moderate pain.   levothyroxine 75 MCG tablet Commonly known as: SYNTHROID Take 75 mcg by mouth daily before breakfast.   menthol-cetylpyridinium 3 MG lozenge Commonly known as: CEPACOL Take 1 lozenge (3 mg total) by mouth as needed for sore throat. What changed: when to take this   oxymetazoline 0.05 % nasal spray Commonly known as: AFRIN Place 1 spray into both nostrils daily as needed for congestion.   pantoprazole 40 MG tablet Commonly known as: Protonix Take 1 tablet (40 mg total) by mouth 2 (two) times daily before a meal.   polyethylene glycol 17 g packet Commonly known as: MIRALAX / GLYCOLAX Take 17 g by mouth daily as needed for mild constipation.   raloxifene 60 MG tablet Commonly known as: EVISTA  Take 60 mg by mouth daily.   senna 8.6 MG Tabs tablet Commonly known as: SENOKOT Take 1 tablet (8.6 mg total) by mouth daily as needed for mild constipation.   simvastatin 5 MG tablet Commonly known as: ZOCOR Take 5 mg by mouth every other day.   telmisartan 80 MG tablet Commonly known as: MICARDIS Take 80 mg by mouth daily.        Follow-up Information     Swinteck, Aaron Edelman, MD. Schedule an appointment as soon as possible for a visit in 2 week(s).   Specialty: Orthopedic  Surgery Why: fracture follow up Contact information: [address] [place] [address] [phone]         [facility] [place] Follow up.   Why: [facility] will provide PT, OT, and nursing in the home.               Discharge Exam: Filed Weights   04/09/22 1442 04/09/22 2245  Weight: 47.9 kg 47.7 kg   General-appears in no acute distress Heart-S1-S2, regular, no murmur auscultated Lungs-clear to auscultation bilaterally, no wheezing or crackles auscultated Abdomen-soft, nontender, no organomegaly Extremities-no edema in the lower extremities Neuro-alert, oriented x3, no focal deficit noted  Condition at discharge: good  The results of significant diagnostics from this hospitalization (including imaging, microbiology, ancillary and laboratory) are listed below for reference.   Imaging Studies: VAS [place] LOWER EXTREMITY VENOUS (DVT)  Result Date: 04/10/2022  Lower Venous DVT Study Patient Name:  Cheryl Maldonado  Date of Exam:   04/10/2022 Medical Rec #: [id number]     Accession #:    [id number] Date of Birth: [birth year]     Patient Gender: F Patient Age:   27 years Exam Location:  [facility] Procedure:      VAS [place] LOWER EXTREMITY VENOUS (DVT) Referring Phys: Nyoka Lint DOUTOVA --------------------------------------------------------------------------------  Indications: Edema.  Risk Factors: None identified. Limitations: Poor ultrasound/tissue interface. Comparison Study: No prior studies. Performing Technologist: Oliver Hum RVT  Examination Guidelines: A complete evaluation includes B-mode imaging, spectral Doppler, color Doppler, and power Doppler as needed of all accessible portions of each vessel. Bilateral testing is considered an integral part of a complete examination. Limited examinations for reoccurring indications may be performed as noted. The reflux portion of the exam is performed with the patient in reverse Trendelenburg.   +---------+---------------+---------+-----------+----------+-------------------+ RIGHT    CompressibilityPhasicitySpontaneityPropertiesThrombus Aging      +---------+---------------+---------+-----------+----------+-------------------+ CFV      Full           Yes      Yes                                      +---------+---------------+---------+-----------+----------+-------------------+ SFJ      Full                                                             +---------+---------------+---------+-----------+----------+-------------------+ FV Prox  Full                                                             +---------+---------------+---------+-----------+----------+-------------------+  FV Mid   Full                                                             +---------+---------------+---------+-----------+----------+-------------------+ FV Distal               Yes      Yes                                      +---------+---------------+---------+-----------+----------+-------------------+ PFV      Full                                                             +---------+---------------+---------+-----------+----------+-------------------+ POP      Full           Yes      Yes                                      +---------+---------------+---------+-----------+----------+-------------------+ PTV      Full                                                             +---------+---------------+---------+-----------+----------+-------------------+ PERO                                                  Not well visualized +---------+---------------+---------+-----------+----------+-------------------+   +----+---------------+---------+-----------+----------+--------------+ LEFTCompressibilityPhasicitySpontaneityPropertiesThrombus Aging +----+---------------+---------+-----------+----------+--------------+ CFV Full           Yes       Yes                                 +----+---------------+---------+-----------+----------+--------------+    Summary: RIGHT: - There is no evidence of deep vein thrombosis in the lower extremity. However, portions of this examination were limited- see technologist comments above.  - No cystic structure found in the popliteal fossa.  LEFT: - No evidence of common femoral vein obstruction.  *See table(s) above for measurements and observations.    Preliminary    DG Abd 1 View  Result Date: 04/09/2022 CLINICAL DATA:  Fall EXAM: ABDOMEN - 1 VIEW COMPARISON:  None Available. FINDINGS: Nonobstructive bowel gas pattern. Moderate stool burden. There are numerous surgical clips overlying the abdomen and pelvis. Prior right hip arthroplasty. Prior left proximal femur fixation. There are mildly displaced fractures of the right superior and inferior pubic rami. Nondisplaced right sacral ala fracture best seen on same day CT. IMPRESSION: No evidence of bowel obstruction.  Moderate stool burden. Mildly displaced right superior and inferior pubic rami fractures. Nondisplaced right sacral ala fracture. Electronically Signed  By: Maurine Simmering M.D.   On: 04/09/2022 20:22   DG CHEST PORT 1 VIEW  Result Date: 04/09/2022 CLINICAL DATA:  Fall EXAM: PORTABLE CHEST 1 VIEW COMPARISON:  Radiograph 04/11/2019 FINDINGS: Unchanged cardiomediastinal silhouette. There is left costophrenic angle blunting suggesting trace effusion or pleural thickening. There is no focal airspace disease. No pneumothorax. There is a questionable nondisplaced fracture involving the anterior left eleventh rib. IMPRESSION: Questionable nondisplaced left anterior eleventh rib fracture. Left costophrenic angle blunting suggesting a trace effusion. Electronically Signed   By: Maurine Simmering M.D.   On: 04/09/2022 20:20   CT HEAD WO CONTRAST (5MM)  Result Date: 04/09/2022 CLINICAL DATA:  Head trauma, fall. EXAM: CT HEAD WITHOUT CONTRAST TECHNIQUE: Contiguous  axial images were obtained from the base of the skull through the vertex without intravenous contrast. RADIATION DOSE REDUCTION: This exam was performed according to the departmental dose-optimization program which includes automated exposure control, adjustment of the mA and/or kV according to patient size and/or use of iterative reconstruction technique. COMPARISON:  None Available. FINDINGS: Brain: No acute intracranial hemorrhage, midline shift or mass effect. No extra-axial fluid collection. Mild diffuse atrophy is noted. Periventricular and subcortical white matter hypodensities are present bilaterally. No hydrocephalus. Vascular: No hyperdense vessel or unexpected calcification. Skull: Normal. Negative for fracture or focal lesion. Sinuses/Orbits: No acute finding. Other: None. IMPRESSION: 1. No acute intracranial process. 2. Atrophy with chronic microvascular ischemic changes. Electronically Signed   By: Brett Fairy M.D.   On: 04/09/2022 20:08   CT PELVIS WO CONTRAST  Result Date: 04/09/2022 CLINICAL DATA:  Mechanical fall.  Evaluate for fracture. EXAM: CT PELVIS WITHOUT CONTRAST TECHNIQUE: Multidetector CT imaging of the pelvis was performed following the standard protocol without intravenous contrast. RADIATION DOSE REDUCTION: This exam was performed according to the departmental dose-optimization program which includes automated exposure control, adjustment of the mA and/or kV according to patient size and/or use of iterative reconstruction technique. COMPARISON:  04/09/2022 FINDINGS: Bones/Joint/Cartilage Acute nondisplaced fractures of the right superior and inferior pubic rami. Acute nondisplaced right sacral ala fracture. Slight buckling of the anterior cortex of the S2 vertebral body concerning for a subtle fracture. Right total hip arthroplasty without hardware failure or complication. No right hip fracture or dislocation. No left hip fracture or dislocation. Prior ORIF of a healed  intertrochanteric fracture. Chronic L4 vertebral body compression fracture. Grade 1 anterolisthesis of L4 on L5 secondary to facet disease. Normal alignment. No joint effusion. Ligaments Ligaments are suboptimally evaluated by CT. Muscles and Tendons Muscles are normal. No muscle atrophy. No intramuscular fluid collection or hematoma. Soft tissue No fluid collection or hematoma.  No soft tissue mass. IMPRESSION: 1. Acute nondisplaced fractures of the right superior and inferior pubic rami. 2. Acute nondisplaced right sacral ala fracture. 3. Electronically Signed   By: Kathreen Devoid M.D.   On: 04/09/2022 17:32   DG Hip Unilat W or Wo Pelvis 2-3 Views Right  Result Date: 04/09/2022 CLINICAL DATA:  Fall. EXAM: DG HIP (WITH OR WITHOUT PELVIS) 2-3V RIGHT COMPARISON:  Right hip x-ray 12/05/2020 FINDINGS: There is a right hip arthroplasty in anatomic alignment. There is a left-sided hip screw and intramedullary nail. The bones are osteopenic. There is an acute minimally displaced fracture of the right superior pubic ramus and right inferior pubic ramus. Pubic symphysis appears intact. There is no hardware loosening. Multiple surgical clips overlie the pelvis. IMPRESSION: 1. Acute fractures of the right inferior and superior pubic rami. 2. No dislocation. 3. Right hip arthroplasty  and left-sided hip screw appear uncomplicated. Electronically Signed   By: Ronney Asters M.D.   On: 04/09/2022 15:46    Microbiology: Results for orders placed or performed during the hospital encounter of 12/29/21  Resp Panel by RT-PCR (Flu A&B, Covid) Nasopharyngeal Swab     Status: None   Collection Time: 12/29/21 12:41 PM   Specimen: Nasopharyngeal Swab; Nasopharyngeal(NP) swabs in vial transport medium  Result Value Ref Range Status   SARS Coronavirus 2 by RT PCR NEGATIVE NEGATIVE Final    Comment: (NOTE) SARS-CoV-2 target nucleic acids are NOT DETECTED.  The SARS-CoV-2 RNA is generally detectable in upper respiratory specimens  during the acute phase of infection. The lowest concentration of SARS-CoV-2 viral copies this assay can detect is 138 copies/mL. A negative result does not preclude SARS-Cov-2 infection and should not be used as the sole basis for treatment or other patient management decisions. A negative result may occur with  improper specimen collection/handling, submission of specimen other than nasopharyngeal swab, presence of viral mutation(s) within the areas targeted by this assay, and inadequate number of viral copies(<138 copies/mL). A negative result must be combined with clinical observations, patient history, and epidemiological information. The expected result is Negative.  Fact Sheet for Patients:  EntrepreneurPulse.com.au  Fact Sheet for Healthcare Providers:  IncredibleEmployment.be  This test is no t yet approved or cleared by the [place] FDA and  has been authorized for detection and/or diagnosis of SARS-CoV-2 by FDA under an Emergency Use Authorization (EUA). This EUA will remain  in effect (meaning this test can be used) for the duration of the COVID-19 declaration under Section 564(b)(1) of the Act, 21 U.S.C.section 360bbb-3(b)(1), unless the authorization is terminated  or revoked sooner.       Influenza A by PCR NEGATIVE NEGATIVE Final   Influenza B by PCR NEGATIVE NEGATIVE Final    Comment: (NOTE) The Xpert Xpress SARS-CoV-2/FLU/RSV plus assay is intended as an aid in the diagnosis of influenza from Nasopharyngeal swab specimens and should not be used as a sole basis for treatment. Nasal washings and aspirates are unacceptable for Xpert Xpress SARS-CoV-2/FLU/RSV testing.  Fact Sheet for Patients: EntrepreneurPulse.com.au  Fact Sheet for Healthcare Providers: IncredibleEmployment.be  This test is not yet approved or cleared by the [place] FDA and has been authorized for detection  and/or diagnosis of SARS-CoV-2 by FDA under an Emergency Use Authorization (EUA). This EUA will remain in effect (meaning this test can be used) for the duration of the COVID-19 declaration under Section 564(b)(1) of the Act, 21 U.S.C. section 360bbb-3(b)(1), unless the authorization is terminated or revoked.  Performed at [facility], [place] [address], [place], [place] [address]     Labs: CBC: Recent Labs  Lab 04/09/22 1747 04/10/22 0435  WBC 14.7* 8.3  NEUTROABS 12.9* 6.3  HGB 12.7 10.5*  HCT 38.4 31.9*  MCV 90.1 90.6  PLT 275 387   Basic Metabolic Panel: Recent Labs  Lab 04/09/22 1747 04/10/22 0435  NA 140 139  K 4.0 3.7  CL 106 109  CO2 25 26  GLUCOSE 118* 115*  BUN 26* 23  CREATININE 0.84 0.76  CALCIUM 9.7 9.2  MG 2.1  --   PHOS 3.4  --    Liver Function Tests: Recent Labs  Lab 04/09/22 1747 04/10/22 0435  AST 27 23  ALT 21 18  ALKPHOS 48 39  BILITOT 0.6 0.8  PROT 6.3* 5.5*  ALBUMIN 3.7 3.2*   CBG: No results for input(s): "  GLUCAP" in the last 168 hours.  Discharge time spent: greater than 30 minutes.  Signed: Oswald Hillock, MD [facility] 04/10/2022

## 2022-04-10 NOTE — TOC Transition Note (Signed)
Transition of Care [facility]) - CM/SW Discharge Note  Patient Details  Name: Cheryl Maldonado MRN: [id number] Date of Birth: 07-29-1933  Transition of [facility]) CM/SW Contact:  Sherie Don, LCSW Phone Number: 04/10/2022, 1:25 PM  Clinical Narrative: PT and OT evaluations recommended HH. CSW spoke with patient's son regarding recommendations and family and patient are agreeable to [facility]. Patient has used [facility] before, but requested a new agency. Son selected [facility]. CSW made referral to [facility] with [facility], which was accepted. HH orders are in. CSW left VM for son regarding the referral being accepted. TOC signing off.    Final next level of care: [place] Barriers to Discharge: No Barriers Identified  Patient Goals and CMS Choice Patient states their goals for this hospitalization and ongoing recovery are:: Discharge home CMS Medicare.gov Compare Post Acute Care list provided to:: Patient Represenative (must comment) Choice offered to / list presented to : Adult Children, Patient  Discharge Plan and Services        DME Arranged: N/A DME Agency: NA HH Arranged: RN, PT, OT HH Agency: [place] Date [facility] Agency Contacted: 04/10/22 Time [place]: [id number] Representative spoke with at [place]: Delsa Sale  Readmission Risk Interventions     No data to display

## 2022-04-11 DIAGNOSIS — S3282XD Multiple fractures of pelvis without disruption of pelvic ring, subsequent encounter for fracture with routine healing: Secondary | ICD-10-CM | POA: Diagnosis not present

## 2022-04-11 DIAGNOSIS — K5900 Constipation, unspecified: Secondary | ICD-10-CM | POA: Diagnosis not present

## 2022-04-11 DIAGNOSIS — S32810A Multiple fractures of pelvis with stable disruption of pelvic ring, initial encounter for closed fracture: Secondary | ICD-10-CM | POA: Diagnosis not present

## 2022-04-11 DIAGNOSIS — I10 Essential (primary) hypertension: Secondary | ICD-10-CM | POA: Diagnosis not present

## 2022-04-11 NOTE — Progress Notes (Signed)
Reviewed written d/c instructions w pt's PCA and son and all questions answered. They verbalized understanding. D/C via w/c w all belongings in stable condition.

## 2022-04-11 NOTE — TOC Transition Note (Signed)
Transition of Care [facility]) - CM/SW Discharge Note   Patient Details  Name: Zohra Clavel MRN: [id number] Date of Birth: 07/06/1933  Transition of Care [facility]) CM/SW Contact:  Leeroy Cha, RN Phone Number: 04/11/2022, 10:26 AM   Clinical Narrative:    Patient dcd to home.  [facility] made aware of dcd to start services.    Final next level of care: [place] Barriers to Discharge: No Barriers Identified   Patient Goals and CMS Choice Patient states their goals for this hospitalization and ongoing recovery are:: Discharge home CMS Medicare.gov Compare Post Acute Care list provided to:: Patient Represenative (must comment) Choice offered to / list presented to : Adult Children, Patient  Discharge Placement                       Discharge Plan and Services                DME Arranged: N/A DME Agency: NA       HH Arranged: RN, PT, OT HH Agency: [place] [facility] Agency Contacted: 04/10/22 Time [place]: 4103 Representative spoke with at [place]: Delsa Sale  Social Determinants of Health ([place]) Interventions     Readmission Risk Interventions     No data to display

## 2022-04-11 NOTE — Progress Notes (Signed)
Subjective: 88 year old female with medical history of dementia, iron-deficiency anemia, history of GI bleed, chronic gastric ulcer, hypertension, hyperlipidemia, history of breast cancer, hypothyroidism, history of uterine cancer presented with mechanical fall.  Patient developed severe pelvic pain after fall.  Unable to ambulate.  In the ED imaging showed acute fracture of right superior and inferior pubic rami.  Confirmed by CT of the pelvis.  Patient was discharged yesterday however could not go home as she complained of worsening of pain.  Pain is significantly improved today.   Vitals:   04/10/22 2111 04/11/22 0510  BP: (!) 142/74 126/71  Pulse: 75 65  Resp: 16 16  Temp: (!) 97.5 F (36.4 C) 98 F (36.7 C)  SpO2: 94% 91%   General-appears in no acute distress Heart-S1-S2, regular, no murmur auscultated Lungs-clear to auscultation bilaterally, no wheezing or crackles auscultated Abdomen-soft, nontender, no organomegaly Extremities-no edema in the lower extremities Neuro-alert, oriented x3, no focal deficit noted   A/P Pelvic fractures -CT confirmed fracture of right superior and inferior pubic rami -PT saw the patient, recommend home health PT -Weightbearing as tolerated -Orthopedics Dr. Lyla Glassing will see patient in 2 weeks -We will discharge on Vicodin 5/325 1 to 2 tablets every 6 hours as needed for pain   Constipation -Continue MiraLAX 17 g daily as needed -Senokot-S tablet daily as needed   Rib fracture -Seen on chest x-ray -Pain management   Chronic lymphedema right leg -Venous duplex of lower extremity was negative for DVT   History of gastric ulcers -Continue Protonix   History of breast cancer -Followed by Dr. Alen Blew as outpatient   Hypertension -Blood pressure controlled -Continue Micardis, Norvasc    See details in discharge summary  [place]

## 2022-04-20 DIAGNOSIS — Z09 Encounter for follow-up examination after completed treatment for conditions other than malignant neoplasm: Secondary | ICD-10-CM | POA: Diagnosis not present

## 2022-04-20 DIAGNOSIS — S32501D Unspecified fracture of right pubis, subsequent encounter for fracture with routine healing: Secondary | ICD-10-CM | POA: Diagnosis not present

## 2022-04-20 DIAGNOSIS — S32591D Other specified fracture of right pubis, subsequent encounter for fracture with routine healing: Secondary | ICD-10-CM | POA: Diagnosis not present

## 2022-04-20 DIAGNOSIS — S2232XD Fracture of one rib, left side, subsequent encounter for fracture with routine healing: Secondary | ICD-10-CM | POA: Diagnosis not present

## 2022-04-20 DIAGNOSIS — S32511D Fracture of superior rim of right pubis, subsequent encounter for fracture with routine healing: Secondary | ICD-10-CM | POA: Diagnosis not present

## 2022-04-20 DIAGNOSIS — E039 Hypothyroidism, unspecified: Secondary | ICD-10-CM | POA: Diagnosis not present

## 2022-04-20 DIAGNOSIS — S32110D Nondisplaced Zone I fracture of sacrum, subsequent encounter for fracture with routine healing: Secondary | ICD-10-CM | POA: Diagnosis not present

## 2022-04-27 DIAGNOSIS — S32591D Other specified fracture of right pubis, subsequent encounter for fracture with routine healing: Secondary | ICD-10-CM | POA: Diagnosis not present

## 2022-04-27 DIAGNOSIS — S32511D Fracture of superior rim of right pubis, subsequent encounter for fracture with routine healing: Secondary | ICD-10-CM | POA: Diagnosis not present

## 2022-05-26 DIAGNOSIS — M8000XD Age-related osteoporosis with current pathological fracture, unspecified site, subsequent encounter for fracture with routine healing: Secondary | ICD-10-CM | POA: Diagnosis not present

## 2022-06-02 ENCOUNTER — Other Ambulatory Visit (HOSPITAL_COMMUNITY): Payer: Self-pay

## 2022-06-02 DIAGNOSIS — E039 Hypothyroidism, unspecified: Secondary | ICD-10-CM | POA: Diagnosis not present

## 2022-06-02 DIAGNOSIS — I10 Essential (primary) hypertension: Secondary | ICD-10-CM | POA: Diagnosis not present

## 2022-06-02 DIAGNOSIS — M810 Age-related osteoporosis without current pathological fracture: Secondary | ICD-10-CM | POA: Diagnosis not present

## 2022-06-02 DIAGNOSIS — E673 Hypervitaminosis D: Secondary | ICD-10-CM | POA: Diagnosis not present

## 2022-06-03 ENCOUNTER — Other Ambulatory Visit (HOSPITAL_COMMUNITY): Payer: Self-pay

## 2022-06-03 MED ORDER — PROLIA 60 MG/ML ~~LOC~~ SOSY
PREFILLED_SYRINGE | SUBCUTANEOUS | 1 refills | Status: DC
  Filled 2022-06-03: qty 1, 180d supply, fill #0
  Filled 2022-12-18: qty 1, 180d supply, fill #1

## 2022-06-22 DIAGNOSIS — I890 Lymphedema, not elsewhere classified: Secondary | ICD-10-CM | POA: Diagnosis not present

## 2022-06-22 DIAGNOSIS — E785 Hyperlipidemia, unspecified: Secondary | ICD-10-CM | POA: Diagnosis not present

## 2022-06-22 DIAGNOSIS — M810 Age-related osteoporosis without current pathological fracture: Secondary | ICD-10-CM | POA: Diagnosis not present

## 2022-06-22 DIAGNOSIS — Z0000 Encounter for general adult medical examination without abnormal findings: Secondary | ICD-10-CM | POA: Diagnosis not present

## 2022-06-22 DIAGNOSIS — E673 Hypervitaminosis D: Secondary | ICD-10-CM | POA: Diagnosis not present

## 2022-06-22 DIAGNOSIS — R410 Disorientation, unspecified: Secondary | ICD-10-CM | POA: Diagnosis not present

## 2022-06-22 DIAGNOSIS — R413 Other amnesia: Secondary | ICD-10-CM | POA: Diagnosis not present

## 2022-06-22 DIAGNOSIS — E039 Hypothyroidism, unspecified: Secondary | ICD-10-CM | POA: Diagnosis not present

## 2022-06-29 DIAGNOSIS — S32591D Other specified fracture of right pubis, subsequent encounter for fracture with routine healing: Secondary | ICD-10-CM | POA: Diagnosis not present

## 2022-06-29 DIAGNOSIS — S32511D Fracture of superior rim of right pubis, subsequent encounter for fracture with routine healing: Secondary | ICD-10-CM | POA: Diagnosis not present

## 2022-07-01 DIAGNOSIS — M810 Age-related osteoporosis without current pathological fracture: Secondary | ICD-10-CM | POA: Diagnosis not present

## 2022-07-13 IMAGING — CT CT HEAD W/O CM
3 series · 15 of 47 positions shown, 18 images · non-contrast
Comparison: None Available.

CLINICAL DATA: Head trauma, fall.



[Series 2: head wo · axial · 0.41mm/px · z∈[-123,+2]mm · 9 of 30 slices shown, 12 images]
[im 3/30  brain]
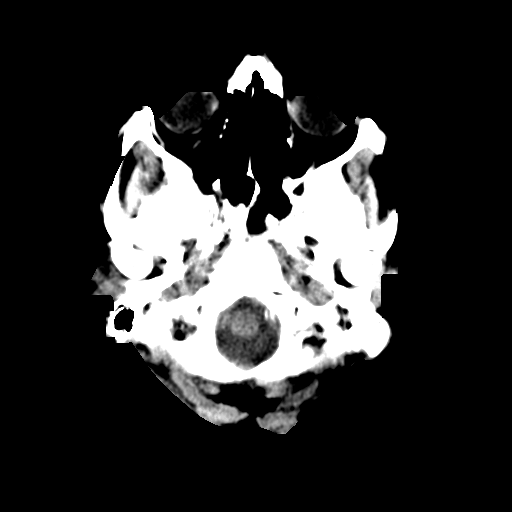
[im 3/30  bone]
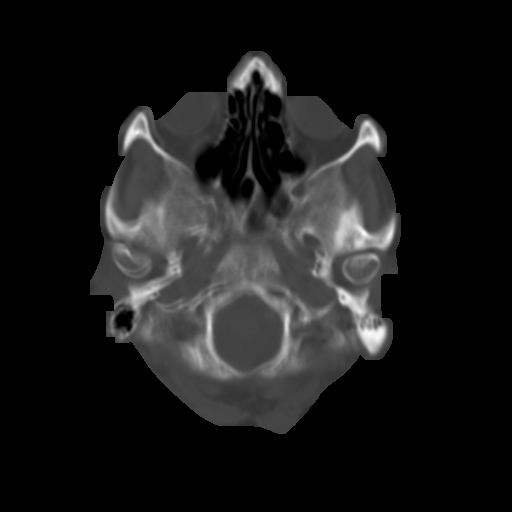
[im 6/30  brain]
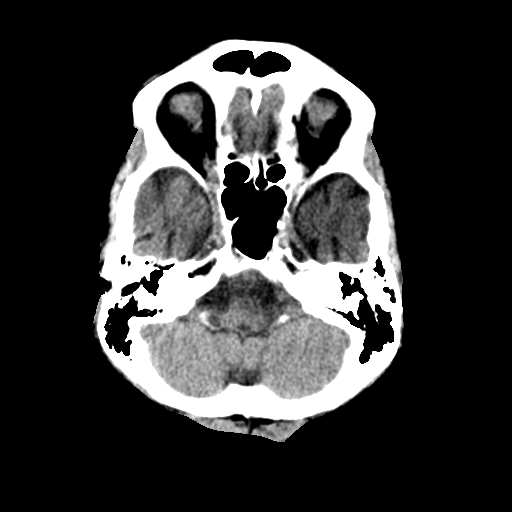
[im 9/30  brain]
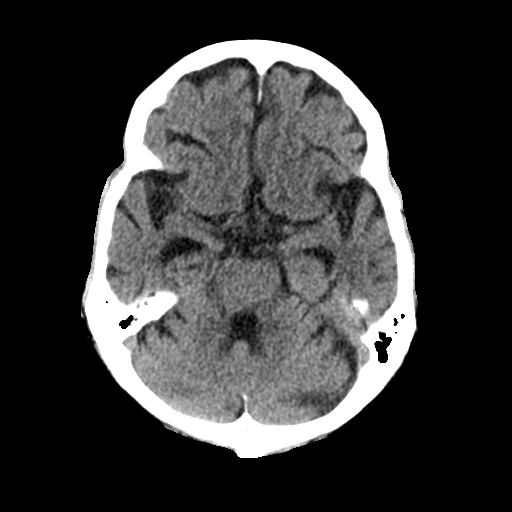
[im 12/30  brain]
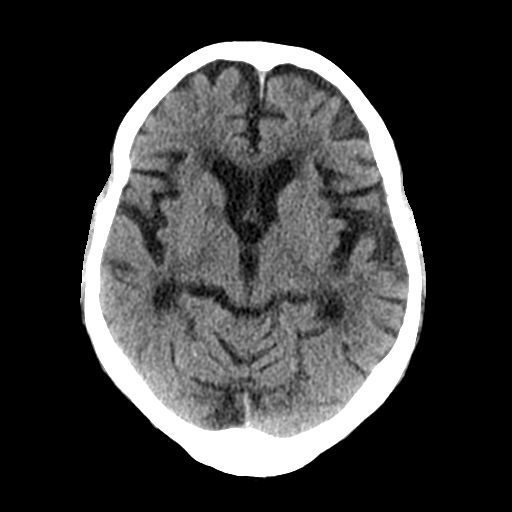
[im 16/30  brain]
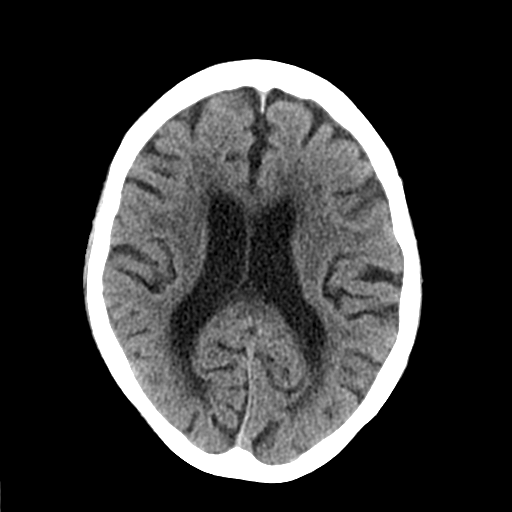
[im 16/30  bone]
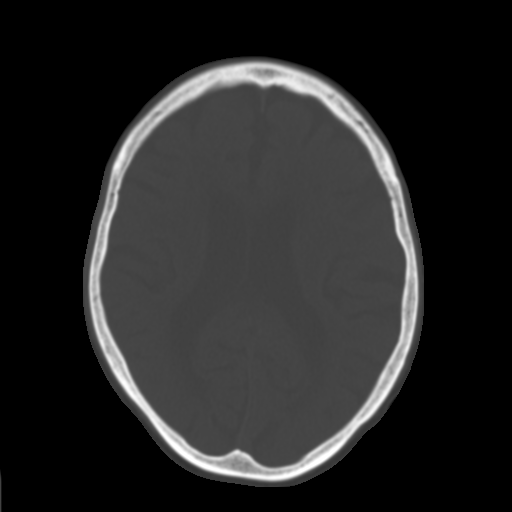
[im 19/30  brain]
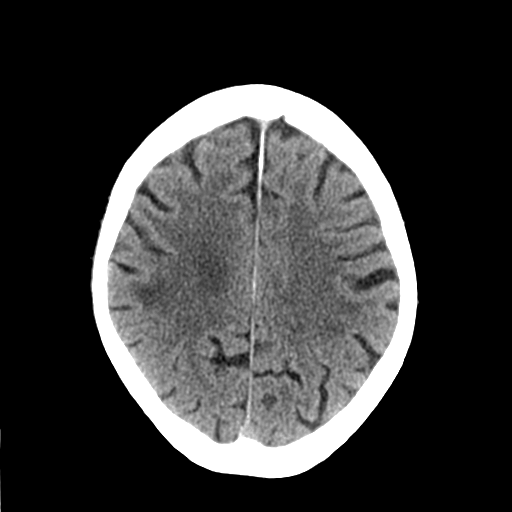
[im 22/30  brain]
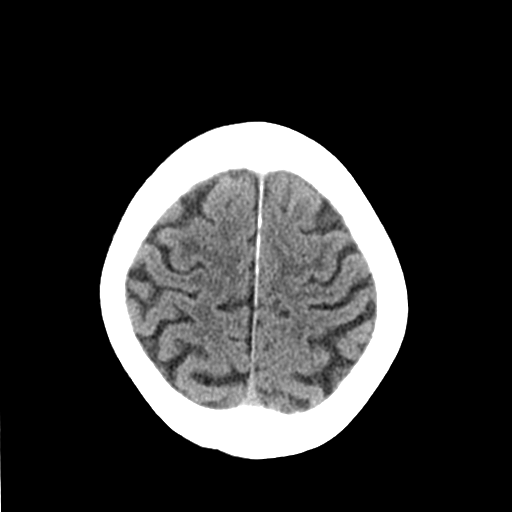
[im 25/30  brain]
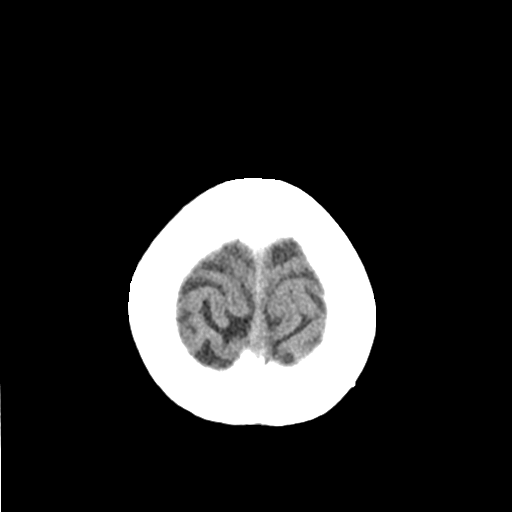
[im 28/30  brain]
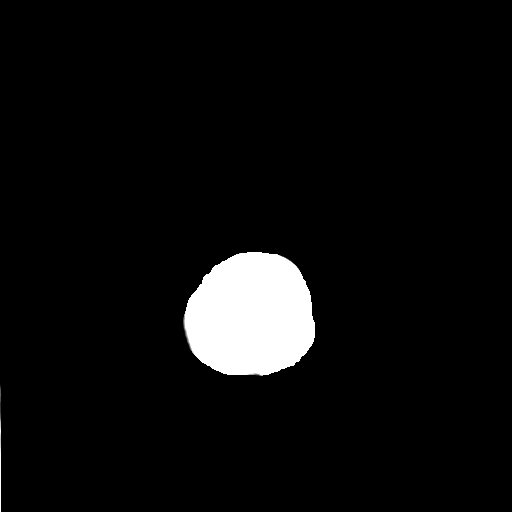
[im 28/30  bone]
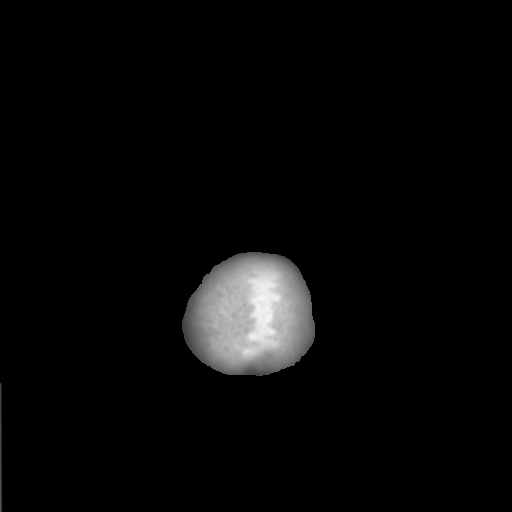

[Series 5: coronal soft tissue · coronal · 0.30mm/px · 3 of 66 slices shown]
[im 22/66  brain]
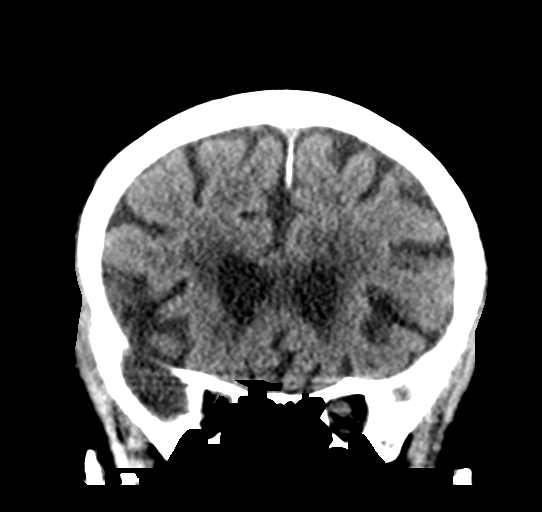
[im 29/66  brain]
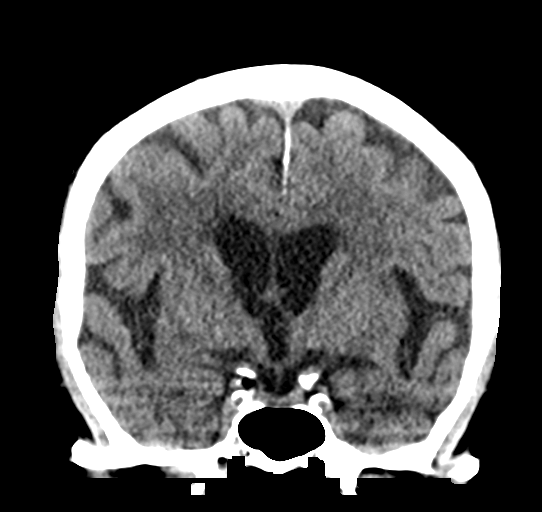
[im 37/66  brain]
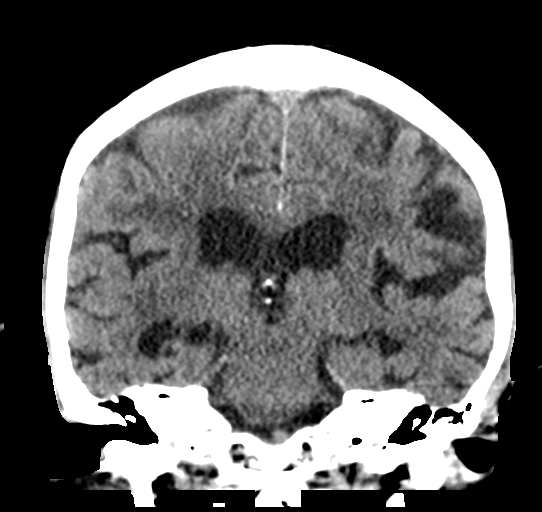

[Series 6: sagittal soft tissue · sagittal · 0.30mm/px · 3 of 55 slices shown]
[im 19/55  brain]
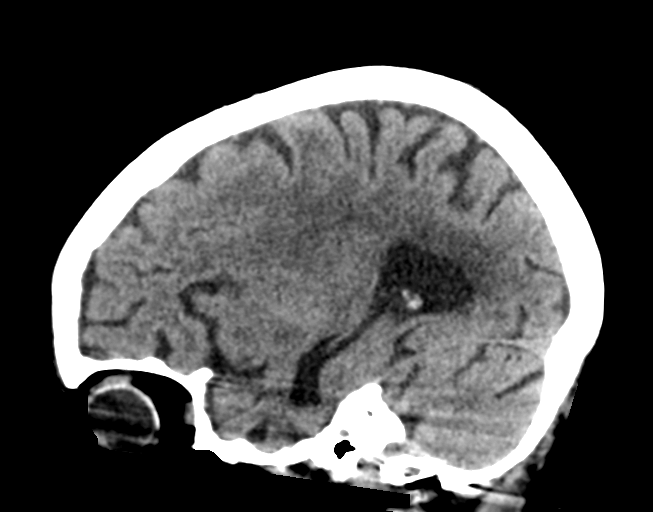
[im 28/55  brain]
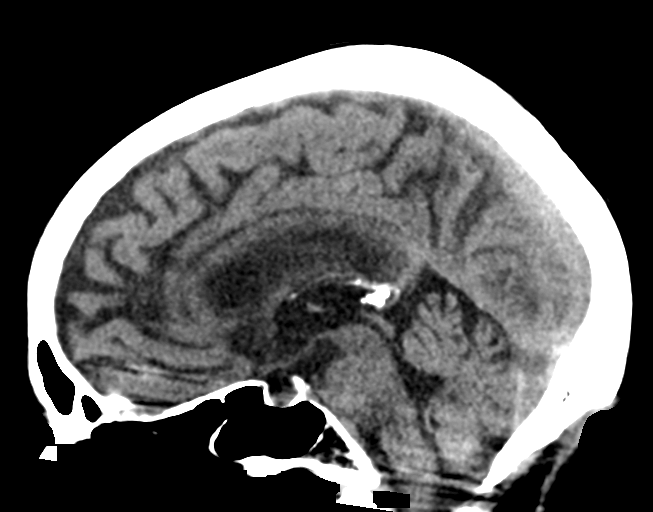
[im 37/55  brain]
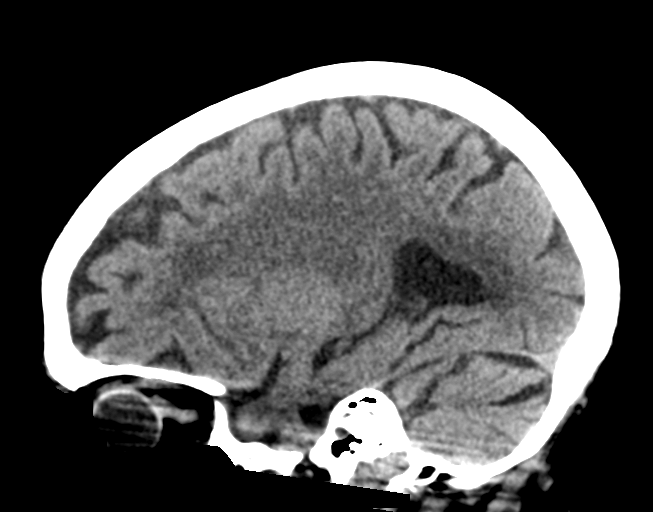

[15 of 47 positions shown; findings below may reference images not displayed]

FINDINGS: Brain: No acute intracranial hemorrhage, midline shift or mass
effect. No extra-axial fluid collection. Mild diffuse atrophy is
noted. Periventricular and subcortical white matter hypodensities
are present bilaterally. No hydrocephalus.

Vascular: No hyperdense vessel or unexpected calcification.

Skull: Normal. Negative for fracture or focal lesion.

Sinuses/Orbits: No acute finding.

Other: None.
IMPRESSION: 1. No acute intracranial process.
2. Atrophy with chronic microvascular ischemic changes.

## 2022-08-15 DIAGNOSIS — Z23 Encounter for immunization: Secondary | ICD-10-CM | POA: Diagnosis not present

## 2022-09-21 DIAGNOSIS — Z23 Encounter for immunization: Secondary | ICD-10-CM | POA: Diagnosis not present

## 2022-11-16 ENCOUNTER — Other Ambulatory Visit (HOSPITAL_COMMUNITY): Payer: Self-pay

## 2022-11-17 ENCOUNTER — Other Ambulatory Visit (HOSPITAL_COMMUNITY): Payer: Self-pay

## 2022-11-18 ENCOUNTER — Other Ambulatory Visit (HOSPITAL_COMMUNITY): Payer: Self-pay

## 2022-11-20 ENCOUNTER — Other Ambulatory Visit (HOSPITAL_COMMUNITY): Payer: Self-pay

## 2022-11-20 ENCOUNTER — Other Ambulatory Visit: Payer: Self-pay

## 2022-12-16 DIAGNOSIS — E039 Hypothyroidism, unspecified: Secondary | ICD-10-CM | POA: Diagnosis not present

## 2022-12-16 DIAGNOSIS — D500 Iron deficiency anemia secondary to blood loss (chronic): Secondary | ICD-10-CM | POA: Diagnosis not present

## 2022-12-16 DIAGNOSIS — R413 Other amnesia: Secondary | ICD-10-CM | POA: Diagnosis not present

## 2022-12-16 DIAGNOSIS — E785 Hyperlipidemia, unspecified: Secondary | ICD-10-CM | POA: Diagnosis not present

## 2022-12-16 DIAGNOSIS — E673 Hypervitaminosis D: Secondary | ICD-10-CM | POA: Diagnosis not present

## 2022-12-18 ENCOUNTER — Other Ambulatory Visit (HOSPITAL_COMMUNITY): Payer: Self-pay

## 2022-12-21 ENCOUNTER — Other Ambulatory Visit: Payer: Self-pay

## 2022-12-23 DIAGNOSIS — Z23 Encounter for immunization: Secondary | ICD-10-CM | POA: Diagnosis not present

## 2022-12-23 DIAGNOSIS — I129 Hypertensive chronic kidney disease with stage 1 through stage 4 chronic kidney disease, or unspecified chronic kidney disease: Secondary | ICD-10-CM | POA: Diagnosis not present

## 2022-12-23 DIAGNOSIS — M810 Age-related osteoporosis without current pathological fracture: Secondary | ICD-10-CM | POA: Diagnosis not present

## 2022-12-23 DIAGNOSIS — E673 Hypervitaminosis D: Secondary | ICD-10-CM | POA: Diagnosis not present

## 2022-12-23 DIAGNOSIS — I890 Lymphedema, not elsewhere classified: Secondary | ICD-10-CM | POA: Diagnosis not present

## 2022-12-23 DIAGNOSIS — E039 Hypothyroidism, unspecified: Secondary | ICD-10-CM | POA: Diagnosis not present

## 2022-12-23 DIAGNOSIS — R413 Other amnesia: Secondary | ICD-10-CM | POA: Diagnosis not present

## 2022-12-23 DIAGNOSIS — E785 Hyperlipidemia, unspecified: Secondary | ICD-10-CM | POA: Diagnosis not present

## 2022-12-31 ENCOUNTER — Other Ambulatory Visit (HOSPITAL_COMMUNITY): Payer: Self-pay

## 2023-01-01 ENCOUNTER — Other Ambulatory Visit (HOSPITAL_COMMUNITY): Payer: Self-pay

## 2023-01-01 ENCOUNTER — Other Ambulatory Visit: Payer: Self-pay

## 2023-01-05 ENCOUNTER — Other Ambulatory Visit: Payer: Self-pay

## 2023-01-06 DIAGNOSIS — M8000XD Age-related osteoporosis with current pathological fracture, unspecified site, subsequent encounter for fracture with routine healing: Secondary | ICD-10-CM | POA: Diagnosis not present

## 2023-03-30 ENCOUNTER — Other Ambulatory Visit (HOSPITAL_COMMUNITY): Payer: Self-pay

## 2023-06-15 ENCOUNTER — Other Ambulatory Visit (HOSPITAL_COMMUNITY): Payer: Self-pay

## 2023-06-16 ENCOUNTER — Other Ambulatory Visit (HOSPITAL_COMMUNITY): Payer: Self-pay

## 2023-06-22 ENCOUNTER — Other Ambulatory Visit (HOSPITAL_COMMUNITY): Payer: Self-pay

## 2023-06-22 MED ORDER — PROLIA 60 MG/ML ~~LOC~~ SOSY
PREFILLED_SYRINGE | SUBCUTANEOUS | 1 refills | Status: AC
  Filled 2023-06-22: qty 1, 180d supply, fill #0
  Filled 2023-12-29: qty 1, 180d supply, fill #1

## 2023-06-23 DIAGNOSIS — Z0000 Encounter for general adult medical examination without abnormal findings: Secondary | ICD-10-CM | POA: Diagnosis not present

## 2023-06-23 DIAGNOSIS — E039 Hypothyroidism, unspecified: Secondary | ICD-10-CM | POA: Diagnosis not present

## 2023-06-23 DIAGNOSIS — I129 Hypertensive chronic kidney disease with stage 1 through stage 4 chronic kidney disease, or unspecified chronic kidney disease: Secondary | ICD-10-CM | POA: Diagnosis not present

## 2023-06-23 DIAGNOSIS — E785 Hyperlipidemia, unspecified: Secondary | ICD-10-CM | POA: Diagnosis not present

## 2023-06-23 DIAGNOSIS — R413 Other amnesia: Secondary | ICD-10-CM | POA: Diagnosis not present

## 2023-06-23 DIAGNOSIS — E673 Hypervitaminosis D: Secondary | ICD-10-CM | POA: Diagnosis not present

## 2023-06-23 DIAGNOSIS — D509 Iron deficiency anemia, unspecified: Secondary | ICD-10-CM | POA: Diagnosis not present

## 2023-06-30 DIAGNOSIS — I129 Hypertensive chronic kidney disease with stage 1 through stage 4 chronic kidney disease, or unspecified chronic kidney disease: Secondary | ICD-10-CM | POA: Diagnosis not present

## 2023-06-30 DIAGNOSIS — E785 Hyperlipidemia, unspecified: Secondary | ICD-10-CM | POA: Diagnosis not present

## 2023-06-30 DIAGNOSIS — I890 Lymphedema, not elsewhere classified: Secondary | ICD-10-CM | POA: Diagnosis not present

## 2023-06-30 DIAGNOSIS — R413 Other amnesia: Secondary | ICD-10-CM | POA: Diagnosis not present

## 2023-06-30 DIAGNOSIS — E673 Hypervitaminosis D: Secondary | ICD-10-CM | POA: Diagnosis not present

## 2023-06-30 DIAGNOSIS — Z0000 Encounter for general adult medical examination without abnormal findings: Secondary | ICD-10-CM | POA: Diagnosis not present

## 2023-06-30 DIAGNOSIS — E039 Hypothyroidism, unspecified: Secondary | ICD-10-CM | POA: Diagnosis not present

## 2023-06-30 DIAGNOSIS — M810 Age-related osteoporosis without current pathological fracture: Secondary | ICD-10-CM | POA: Diagnosis not present

## 2023-07-01 ENCOUNTER — Other Ambulatory Visit: Payer: Self-pay

## 2023-07-13 ENCOUNTER — Other Ambulatory Visit (HOSPITAL_COMMUNITY): Payer: Self-pay

## 2023-07-13 ENCOUNTER — Other Ambulatory Visit: Payer: Self-pay

## 2023-07-13 DIAGNOSIS — M810 Age-related osteoporosis without current pathological fracture: Secondary | ICD-10-CM | POA: Diagnosis not present

## 2023-07-24 ENCOUNTER — Encounter (HOSPITAL_COMMUNITY): Payer: Self-pay

## 2023-09-28 DIAGNOSIS — E039 Hypothyroidism, unspecified: Secondary | ICD-10-CM | POA: Diagnosis not present

## 2023-09-28 DIAGNOSIS — E785 Hyperlipidemia, unspecified: Secondary | ICD-10-CM | POA: Diagnosis not present

## 2023-10-04 DIAGNOSIS — E785 Hyperlipidemia, unspecified: Secondary | ICD-10-CM | POA: Diagnosis not present

## 2023-10-04 DIAGNOSIS — E039 Hypothyroidism, unspecified: Secondary | ICD-10-CM | POA: Diagnosis not present

## 2023-10-04 DIAGNOSIS — I10 Essential (primary) hypertension: Secondary | ICD-10-CM | POA: Diagnosis not present

## 2023-10-04 DIAGNOSIS — Z23 Encounter for immunization: Secondary | ICD-10-CM | POA: Diagnosis not present

## 2023-12-17 ENCOUNTER — Other Ambulatory Visit: Payer: Self-pay

## 2023-12-29 ENCOUNTER — Other Ambulatory Visit: Payer: Self-pay | Admitting: Pharmacy Technician

## 2023-12-29 ENCOUNTER — Other Ambulatory Visit: Payer: Self-pay

## 2023-12-29 NOTE — Progress Notes (Signed)
 Specialty Pharmacy Refill Coordination Note  Cheryl Maldonado is a 89 y.o. female contacted today regarding refills of specialty medication(s) Denosumab (Prolia)   Patient requested Courier to Provider Office   Delivery date: 01/04/24   Verified address: [facility] [address]   Medication will be filled on 01/03/24.

## 2023-12-30 ENCOUNTER — Other Ambulatory Visit: Payer: Self-pay

## 2024-01-03 ENCOUNTER — Other Ambulatory Visit: Payer: Self-pay

## 2024-01-07 ENCOUNTER — Other Ambulatory Visit (HOSPITAL_COMMUNITY): Payer: Self-pay

## 2024-01-11 DIAGNOSIS — M8000XD Age-related osteoporosis with current pathological fracture, unspecified site, subsequent encounter for fracture with routine healing: Secondary | ICD-10-CM | POA: Diagnosis not present

## 2024-01-25 ENCOUNTER — Other Ambulatory Visit (HOSPITAL_COMMUNITY): Payer: Self-pay

## 2024-02-07 ENCOUNTER — Encounter (HOSPITAL_COMMUNITY): Payer: Self-pay

## 2024-02-07 ENCOUNTER — Emergency Department (HOSPITAL_COMMUNITY)

## 2024-02-07 ENCOUNTER — Other Ambulatory Visit: Payer: Self-pay

## 2024-02-07 ENCOUNTER — Emergency Department (HOSPITAL_COMMUNITY): Admission: EM | Admit: 2024-02-07 | Discharge: 2024-02-07 | Disposition: A | Discharge status: Home / Self Care

## 2024-02-07 DIAGNOSIS — M79605 Pain in left leg: Secondary | ICD-10-CM

## 2024-02-07 DIAGNOSIS — S8992XA Unspecified injury of left lower leg, initial encounter: Secondary | ICD-10-CM | POA: Diagnosis not present

## 2024-02-07 DIAGNOSIS — I10 Essential (primary) hypertension: Secondary | ICD-10-CM | POA: Diagnosis not present

## 2024-02-07 DIAGNOSIS — M79662 Pain in left lower leg: Secondary | ICD-10-CM | POA: Insufficient documentation

## 2024-02-07 DIAGNOSIS — R609 Edema, unspecified: Secondary | ICD-10-CM | POA: Diagnosis not present

## 2024-02-07 DIAGNOSIS — Z96641 Presence of right artificial hip joint: Secondary | ICD-10-CM | POA: Diagnosis not present

## 2024-02-07 DIAGNOSIS — M25559 Pain in unspecified hip: Secondary | ICD-10-CM | POA: Diagnosis not present

## 2024-02-07 MED ORDER — ACETAMINOPHEN 500 MG PO TABS
1000.0000 mg | ORAL_TABLET | Freq: Once | ORAL | Status: AC
Start: 2024-02-07 — End: 2024-02-07
  Administered 2024-02-07: 1000 mg via ORAL
  Filled 2024-02-07: qty 2

## 2024-02-07 NOTE — ED Provider Notes (Signed)
 [place] EMERGENCY DEPARTMENT AT [facility] Provider Note   CSN: [id number] Arrival date & time: 02/07/24  1115     History  Chief Complaint  Patient presents with   Leg Pain    Cheryl Maldonado is a 89 y.o. female.  This is an 89 year old female presenting emergency department for left leg pain.  Poor historian alert to person place not to time,.  Unable to provide meaningful history but has pain to left tibia.  Her home health aide is at bedside and stated the patient did fall on the 28th, but seemingly was asymptomatic until yesterday where she noticed some antalgic type ambulation.  This morning patient complained of increased pain and had difficulty ambulating.  At baseline.  Points to her proximal tibia as well as her ankle   Leg Pain      Home Medications Prior to Admission medications   Medication Sig Start Date End Date Taking? Authorizing Provider  amLODipine (NORVASC) 5 MG tablet Take 5 mg by mouth daily.    [provider]  denosumab (PROLIA) 60 MG/ML SOSY injection Inject 60mg  into the skin every 6 months 06/22/23     HYDROcodone-acetaminophen (NORCO/VICODIN) 5-325 MG tablet Take 1-2 tablets by mouth every 6 (six) hours as needed for moderate pain. 04/10/22   Meredeth Ide, MD  levothyroxine (SYNTHROID, LEVOTHROID) 75 MCG tablet Take 75 mcg by mouth daily before breakfast.    [provider]  menthol-cetylpyridinium (CEPACOL) 3 MG lozenge Take 1 lozenge (3 mg total) by mouth as needed for sore throat. Patient taking differently: Take 1 lozenge by mouth daily as needed for sore throat. 12/08/20   Myrtie Neither, MD  oxymetazoline (AFRIN) 0.05 % nasal spray Place 1 spray into both nostrils daily as needed for congestion.    [provider]  pantoprazole (PROTONIX) 40 MG tablet Take 1 tablet (40 mg total) by mouth 2 (two) times daily before a meal. 01/26/22 04/20/22  Rachael Fee, MD  Pediatric Multivitamins-Iron Kirke Corin W/IRON PO)  Take 2 tablets by mouth daily.    [provider]  polyethylene glycol (MIRALAX / GLYCOLAX) 17 g packet Take 17 g by mouth daily as needed for mild constipation. 04/10/22   Meredeth Ide, MD  raloxifene (EVISTA) 60 MG tablet Take 60 mg by mouth daily.    [provider]  senna (SENOKOT) 8.6 MG TABS tablet Take 1 tablet (8.6 mg total) by mouth daily as needed for mild constipation. 04/10/22   Meredeth Ide, MD  simvastatin (ZOCOR) 5 MG tablet Take 5 mg by mouth every other day.    [provider]  telmisartan (MICARDIS) 80 MG tablet Take 80 mg by mouth daily. 10/30/20   [provider]      Allergies    Patient has no known allergies.    Review of Systems   Review of Systems  Physical Exam Updated Vital Signs BP (!) 131/53   Pulse 73   Temp 98.4 F (36.9 C) (Oral)   Resp 17   Ht 5\' 2"  (1.575 m)   Wt 48 kg   SpO2 95%   BMI 19.35 kg/m  Physical Exam Vitals and nursing note reviewed.  Constitutional:      General: She is not in acute distress.    Appearance: She is not toxic-appearing.  HENT:     Nose: Nose normal.  Eyes:     Conjunctiva/sclera: Conjunctivae normal.  Cardiovascular:     Rate and Rhythm: Normal rate  and regular rhythm.  Pulmonary:     Effort: Pulmonary effort is normal.  Abdominal:     General: Abdomen is flat. There is no distension.  Musculoskeletal:     Comments: No obvious deformity.  Right lower extremity with lymphedema; unchanged and chronic per aide.  Left lower extremity without obvious deformity.  2+ DP pulses.  No obvious erythema, crepitus.  Minor tenderness to palpation along the tibia, but no focal tenderness.  5 out of 5 plantarflexion dorsiflexion.  Full active ROM at left hip.  Neurological:     Mental Status: She is alert.     ED Results / Procedures / Treatments   Labs (all labs ordered are listed, but only abnormal results are displayed) Labs Reviewed - No data to display  EKG None  Radiology DG  Tibia/Fibula Left Result Date: 02/07/2024 CLINICAL DATA:  Pain. EXAM: LEFT TIBIA AND FIBULA - 2 VIEW COMPARISON:  None Available. FINDINGS: There is no evidence of fracture or other focal bone lesions. Soft tissues are unremarkable. IMPRESSION: Negative. Electronically Signed   By: Layla Maw M.D.   On: 02/07/2024 12:56   DG Pelvis 1-2 Views Result Date: 02/07/2024 CLINICAL DATA:  Pain. EXAM: PELVIS - 1  VIEW COMPARISON:  04/09/2022 FINDINGS: Right hip prosthesis. Left hip ORIF. No acute fracture, dislocation or subluxation. Postop changes in the lower abdomen and pelvis. No osteolytic or osteoblastic lesions. Old fracture of the right ischium. IMPRESSION: Postoperative changes. No acute osseous abnormalities. Electronically Signed   By: Layla Maw M.D.   On: 02/07/2024 12:56    Procedures Procedures    Medications Ordered in ED Medications  acetaminophen (TYLENOL) tablet 1,000 mg (1,000 mg Oral Given 02/07/24 1143)    ED Course/ Medical Decision Making/ A&P Clinical Course as of 02/07/24 1400  Mon Feb 07, 2024  1356 Feeling improved after Tylenol.  X-rays negative for acute fracture.  Stable for discharge. [TY]    Clinical Course User Index [TY] Coral Spikes, DO                                 Medical Decision Making 89 year old female presenting emergency department with left leg pain.  History provided mostly by patient's aide who is bedside.  Afebrile nontachycardic, slightly hypertensive.  Some minor tenderness on exam to the tibia, but no overt injury.  Did have a recent fall; but was supposedly asymptomatic afterwards.  She does live at home alone, but no reported falls the past 2 days. Will evaluate for injury with Xrays. Per chart review does have history of hip arthroplasties and pelvis fracture. Given Tylenol for pain.  See ED course for further MDM/disposition.  Amount and/or Complexity of Data Reviewed Independent Historian:     Details: Caregiver provided most  history. External Data Reviewed:     Details: Per prior notes and per caregiver patient's right lower extremity lymphedema is chronic and unchanged Labs:     Details: Reassuring vitals, no evidence of infection on exam.  Labs unlikely to change management or disposition at this time as she seemingly has isolated MSK complaints. Radiology: ordered.  Risk OTC drugs.          Final Clinical Impression(s) / ED Diagnoses Final diagnoses:  None    Rx / DC Orders ED Discharge Orders     None         Coral Spikes, DO 02/07/24 1400

## 2024-02-07 NOTE — ED Notes (Signed)
 Caregiver at bedside

## 2024-02-07 NOTE — Discharge Instructions (Signed)
 You may take over-the-counter pain medication such as Tylenol or ibuprofen for pain.  Please follow-up with your primary doctor.  Return if you develop fevers, chills, worsening pain, unilateral weakness, changes in sensation, sudden onset headache, chest pain, shortness of breath or any new or worsening symptoms that are concerning to you.

## 2024-02-07 NOTE — ED Triage Notes (Signed)
 Patient BIB [facility] from home. Caregiver stated patient has been unsteady on her feet lately. Complaining of left leg pain. Right leg is swollen due to edema.

## 2024-02-09 DIAGNOSIS — I10 Essential (primary) hypertension: Secondary | ICD-10-CM | POA: Diagnosis not present

## 2024-02-09 DIAGNOSIS — E039 Hypothyroidism, unspecified: Secondary | ICD-10-CM | POA: Diagnosis not present

## 2024-02-09 DIAGNOSIS — E785 Hyperlipidemia, unspecified: Secondary | ICD-10-CM | POA: Diagnosis not present

## 2024-02-14 DIAGNOSIS — I10 Essential (primary) hypertension: Secondary | ICD-10-CM | POA: Diagnosis not present

## 2024-02-14 DIAGNOSIS — R413 Other amnesia: Secondary | ICD-10-CM | POA: Diagnosis not present

## 2024-02-14 DIAGNOSIS — E785 Hyperlipidemia, unspecified: Secondary | ICD-10-CM | POA: Diagnosis not present

## 2024-02-14 DIAGNOSIS — R29898 Other symptoms and signs involving the musculoskeletal system: Secondary | ICD-10-CM | POA: Diagnosis not present

## 2024-02-14 DIAGNOSIS — E039 Hypothyroidism, unspecified: Secondary | ICD-10-CM | POA: Diagnosis not present

## 2024-02-21 DIAGNOSIS — R2681 Unsteadiness on feet: Secondary | ICD-10-CM | POA: Diagnosis not present

## 2024-02-21 DIAGNOSIS — M79669 Pain in unspecified lower leg: Secondary | ICD-10-CM | POA: Diagnosis not present

## 2024-02-23 DIAGNOSIS — R2681 Unsteadiness on feet: Secondary | ICD-10-CM | POA: Diagnosis not present

## 2024-02-23 DIAGNOSIS — M79669 Pain in unspecified lower leg: Secondary | ICD-10-CM | POA: Diagnosis not present

## 2024-02-24 DIAGNOSIS — R29898 Other symptoms and signs involving the musculoskeletal system: Secondary | ICD-10-CM | POA: Diagnosis not present

## 2024-02-24 DIAGNOSIS — R9082 White matter disease, unspecified: Secondary | ICD-10-CM | POA: Diagnosis not present

## 2024-02-24 DIAGNOSIS — G319 Degenerative disease of nervous system, unspecified: Secondary | ICD-10-CM | POA: Diagnosis not present

## 2024-02-25 DIAGNOSIS — R2681 Unsteadiness on feet: Secondary | ICD-10-CM | POA: Diagnosis not present

## 2024-02-25 DIAGNOSIS — M79669 Pain in unspecified lower leg: Secondary | ICD-10-CM | POA: Diagnosis not present

## 2024-02-28 DIAGNOSIS — R2681 Unsteadiness on feet: Secondary | ICD-10-CM | POA: Diagnosis not present

## 2024-02-28 DIAGNOSIS — M79669 Pain in unspecified lower leg: Secondary | ICD-10-CM | POA: Diagnosis not present

## 2024-03-01 DIAGNOSIS — M79669 Pain in unspecified lower leg: Secondary | ICD-10-CM | POA: Diagnosis not present

## 2024-03-01 DIAGNOSIS — R2681 Unsteadiness on feet: Secondary | ICD-10-CM | POA: Diagnosis not present

## 2024-03-06 DIAGNOSIS — R2681 Unsteadiness on feet: Secondary | ICD-10-CM | POA: Diagnosis not present

## 2024-03-06 DIAGNOSIS — M79669 Pain in unspecified lower leg: Secondary | ICD-10-CM | POA: Diagnosis not present

## 2024-03-08 DIAGNOSIS — R2681 Unsteadiness on feet: Secondary | ICD-10-CM | POA: Diagnosis not present

## 2024-03-08 DIAGNOSIS — M79669 Pain in unspecified lower leg: Secondary | ICD-10-CM | POA: Diagnosis not present

## 2024-03-13 DIAGNOSIS — R2681 Unsteadiness on feet: Secondary | ICD-10-CM | POA: Diagnosis not present

## 2024-03-13 DIAGNOSIS — M79669 Pain in unspecified lower leg: Secondary | ICD-10-CM | POA: Diagnosis not present

## 2024-03-20 DIAGNOSIS — R2681 Unsteadiness on feet: Secondary | ICD-10-CM | POA: Diagnosis not present

## 2024-03-20 DIAGNOSIS — M79669 Pain in unspecified lower leg: Secondary | ICD-10-CM | POA: Diagnosis not present

## 2024-03-29 DIAGNOSIS — R2681 Unsteadiness on feet: Secondary | ICD-10-CM | POA: Diagnosis not present

## 2024-03-29 DIAGNOSIS — M79669 Pain in unspecified lower leg: Secondary | ICD-10-CM | POA: Diagnosis not present

## 2024-04-11 DIAGNOSIS — M79669 Pain in unspecified lower leg: Secondary | ICD-10-CM | POA: Diagnosis not present

## 2024-04-11 DIAGNOSIS — R2681 Unsteadiness on feet: Secondary | ICD-10-CM | POA: Diagnosis not present

## 2024-04-18 ENCOUNTER — Ambulatory Visit: Admitting: Diagnostic Neuroimaging

## 2024-04-18 ENCOUNTER — Encounter: Payer: Self-pay | Admitting: Diagnostic Neuroimaging

## 2024-04-18 VITALS — BP 140/66 | HR 74 | Ht <= 58 in | Wt 107.4 lb

## 2024-04-18 DIAGNOSIS — F03B18 Unspecified dementia, moderate, with other behavioral disturbance: Secondary | ICD-10-CM

## 2024-04-18 NOTE — Progress Notes (Signed)
 [facility]  PATIENT: Cheryl Maldonado DOB: 05/05/1933  REFERRING CLINICIAN: Yvonnie Heritage, NP HISTORY FROM: patient, caregiver REASON FOR VISIT: new consult   HISTORICAL  CHIEF COMPLAINT:  Chief Complaint  Patient presents with   New Patient (Initial Visit)    RM 6, Pt w/full time caregiver (Nini). Referred by PCP, Pt not certain why she is here. When asked if it was about memory she agreed. Pt CG states Pt does have difficulty finding words or expressing herself verbally. Able to to make needs known verbally.    HISTORY OF PRESENT ILLNESS:   89 year old female here for evaluation of cognitive decline.  For past years patient has had gradual onset progressive short-term memory loss, confusion, cognitive decline.  In 2023 hospitalist there is mention of diagnosis of dementia but unclear how this was made.  In the past 6 to 12 months patient is having more problems with confusion, delirium, hallucinations, paranoia.  Having difficulty with communication, expression and comprehension.  Has been having some more issues with gait and balance difficulty.  Has fallen down several times.  Had an injury to the left leg and went to the emergency room.  The next day she had some problems with the right side of her body.  She has had MRI of the brain which showed no acute findings or acute infarct, but did show chronic atrophy and small vessel ischemic disease.  She lives at home.  She has a in-home aide that helps her Monday to Saturday from 830-530 and send a after 2:45 in the afternoon.  She has 2 sons that live about an hour or 2 away.   REVIEW OF SYSTEMS: Full 14 system review of systems performed and negative with exception of: as per HPI.  ALLERGIES: No Known Allergies  HOME MEDICATIONS: Outpatient Medications Prior to Visit  Medication Sig Dispense Refill   amLODipine  (NORVASC ) 5 MG tablet Take 5 mg by mouth daily.     denosumab  (PROLIA ) 60 MG/ML SOSY injection Inject  60mg  into the skin every 6 months 1 mL 1   levothyroxine  (SYNTHROID , LEVOTHROID) 75 MCG tablet Take 75 mcg by mouth daily before breakfast.     menthol -cetylpyridinium (CEPACOL) 3 MG lozenge Take 1 lozenge (3 mg total) by mouth as needed for sore throat. 100 tablet 12   oxymetazoline  (AFRIN) 0.05 % nasal spray Place 1 spray into both nostrils daily as needed for congestion.     Pediatric Multivitamins-Iron Danielle [place] W/IRON PO) Take 2 tablets by mouth daily.     polyethylene glycol (MIRALAX  / GLYCOLAX ) 17 g packet Take 17 g by mouth daily as needed for mild constipation. 14 each 0   simvastatin  (ZOCOR ) 5 MG tablet Take 10 mg by mouth daily.     telmisartan (MICARDIS) 80 MG tablet Take 80 mg by mouth daily.     senna (SENOKOT) 8.6 MG TABS tablet Take 1 tablet (8.6 mg total) by mouth daily as needed for mild constipation. 30 tablet 0   HYDROcodone -acetaminophen  (NORCO/VICODIN) 5-325 MG tablet Take 1-2 tablets by mouth every 6 (six) hours as needed for moderate pain. (Patient not taking: Reported on 04/18/2024) 30 tablet 0   pantoprazole  (PROTONIX ) 40 MG tablet Take 1 tablet (40 mg total) by mouth 2 (two) times daily before a meal. 168 tablet 0   raloxifene  (EVISTA ) 60 MG tablet Take 60 mg by mouth daily.     No facility-administered medications prior to visit.    PAST MEDICAL HISTORY: Past Medical History:  Diagnosis  Date   Breast cancer (HCC)    Hyperlipidemia 12/29/2021   Hypertension    Hypervitaminosis D 12/29/2021   Hypothyroidism 12/29/2021   Intertrochanteric fracture of left hip (HCC) 04/11/2019   Iron deficiency anemia due to chronic blood loss 12/29/2021   Uterine cancer [facility])     PAST SURGICAL HISTORY: Past Surgical History:  Procedure Laterality Date   ABDOMINAL HYSTERECTOMY     BIOPSY  12/30/2021   Procedure: BIOPSY;  Surgeon: Daina Drum, MD;  Location: [facility];  Service: Gastroenterology;;   COLONOSCOPY     COLONOSCOPY WITH PROPOFOL  N/A 12/30/2021   Procedure:  COLONOSCOPY WITH PROPOFOL ;  Surgeon: Daina Drum, MD;  Location: [facility];  Service: Gastroenterology;  Laterality: N/A;   ESOPHAGOGASTRODUODENOSCOPY (EGD) WITH PROPOFOL  N/A 12/30/2021   Procedure: ESOPHAGOGASTRODUODENOSCOPY (EGD) WITH PROPOFOL ;  Surgeon: Daina Drum, MD;  Location: [facility];  Service: Gastroenterology;  Laterality: N/A;   FEMUR IM NAIL Left 04/13/2019   Procedure: INTRAMEDULLARY (IM) NAIL FEMORAL;  Surgeon: Adonica Hoose, MD;  Location: [facility];  Service: Orthopedics;  Laterality: Left;   MASTECTOMY Left    POLYPECTOMY  12/30/2021   Procedure: POLYPECTOMY;  Surgeon: Daina Drum, MD;  Location: [facility];  Service: Gastroenterology;;   TOTAL HIP ARTHROPLASTY Right 12/06/2020   Procedure: TOTAL HIP ARTHROPLASTY ANTERIOR APPROACH;  Surgeon: Adonica Hoose, MD;  Location: [facility];  Service: Orthopedics;  Laterality: Right;    FAMILY HISTORY: Family History  Problem Relation Age of Onset   Cancer Mother    Stomach cancer Neg Hx    Esophageal cancer Neg Hx    Colon cancer Neg Hx    Pancreatic cancer Neg Hx     SOCIAL HISTORY: Social History   Socioeconomic History   Marital status: Married    Spouse name: Not on file   Number of children: 2   Years of education: Not on file   Highest education level: Not on file  Occupational History   Occupation: retired  Tobacco Use   Smoking status: Never   Smokeless tobacco: Never  Vaping Use   Vaping status: Never Used  Substance and Sexual Activity   Alcohol  use: No    Alcohol /week: 0.0 standard drinks of alcohol    Drug use: Never   Sexual activity: Not on file  Other Topics Concern   Not on file  Social History Narrative   Not on file   Social Drivers of Health   Financial Resource Strain: Not on file  Food Insecurity: No Food Insecurity (06/02/2022)   Received from [facility]   Hunger Vital Sign    Within the past 12 months, you worried that your food would run out before you got the  money to buy more.: Never true    Within the past 12 months, the food you bought just didn't last and you didn't have money to get more.: Never true  Transportation Needs: Not on file  Physical Activity: Not on file  Stress: Not on file  Social Connections: Unknown (03/13/2022)   [facility]   Social Network    Social Network: Not on file  Intimate Partner Violence: Unknown (02/03/2022)   Received from [facility]   HITS    Physically Hurt: Not on file    Insult or Talk Down To: Not on file    Threaten Physical Harm: Not on file    Scream or Curse: Not on file     PHYSICAL EXAM  GENERAL EXAM/CONSTITUTIONAL: Vitals:  Vitals:   04/18/24 0937  BP: (!) 140/66  Pulse: 74  Weight: 107 lb 6.4 oz (48.7 kg)  Height: 4' 9 (1.448 m)   Body mass index is 23.24 kg/m. Wt Readings from Last 3 Encounters:  04/18/24 107 lb 6.4 oz (48.7 kg)  02/07/24 105 lb 13.1 oz (48 kg)  04/09/22 105 lb 2.6 oz (47.7 kg)   Patient is in no distress; well developed, nourished and groomed; neck is supple  CARDIOVASCULAR: Examination of carotid arteries is normal; no carotid bruits Regular rate and rhythm, no murmurs Examination of peripheral vascular system by observation and palpation is normal  EYES: Ophthalmoscopic exam of optic discs and posterior segments is normal; no papilledema or hemorrhages No results found.  MUSCULOSKELETAL: Gait, strength, tone, movements noted in Neurologic exam below  NEUROLOGIC: MENTAL STATUS:     04/18/2024    9:42 AM  MMSE - Mini Mental State Exam  Orientation to time 0  Orientation to Place 3  Registration 3  Attention/ Calculation 0  Recall 0  Language- name 2 objects 2  Language- repeat 0  Language- follow 3 step command 3  Language- read & follow direction 1  Write a sentence 1  Copy design 0  Total score 13   awake, alert, oriented to person DECR MEMORY  DECR attention and concentration DECR FLUENCY DECR fund of knowledge    CRANIAL NERVE:  2nd, 3rd, 4th, 6th - pupils equal and reactive to light, visual fields full to confrontation, extraocular muscles intact, no nystagmus 5th - facial sensation symmetric 7th - facial strength symmetric 8th - hearing intact 9th - palate elevates symmetrically, uvula midline 11th - shoulder shrug symmetric 12th - tongue protrusion midline  MOTOR:  normal bulk and tone, full strength in the BUE, BLE RIGHT LEG LYMPHEDEMA  SENSORY:  normal and symmetric to light touch, temperature, vibration  COORDINATION:  finger-nose-finger, fine finger movements normal  REFLEXES:  deep tendon reflexes 1+ and symmetric  GAIT/STATION:  USING WALKER     DIAGNOSTIC DATA (LABS, IMAGING, TESTING) - I reviewed patient records, labs, notes, testing and imaging myself where available.  Lab Results  Component Value Date   WBC 8.3 04/10/2022   HGB 10.5 (L) 04/10/2022   HCT 31.9 (L) 04/10/2022   MCV 90.6 04/10/2022   PLT 238 04/10/2022      Component Value Date/Time   NA 139 04/10/2022 0435   K 3.7 04/10/2022 0435   CL 109 04/10/2022 0435   CO2 26 04/10/2022 0435   GLUCOSE 115 (H) 04/10/2022 0435   BUN 23 04/10/2022 0435   CREATININE 0.76 04/10/2022 0435   CALCIUM 9.2 04/10/2022 0435   PROT 5.5 (L) 04/10/2022 0435   ALBUMIN 3.2 (L) 04/10/2022 0435   AST 23 04/10/2022 0435   ALT 18 04/10/2022 0435   ALKPHOS 39 04/10/2022 0435   BILITOT 0.8 04/10/2022 0435   GFRNONAA >60 04/10/2022 0435   GFRAA >60 04/15/2019 0456   No results found for: CHOL, HDL, LDLCALC, LDLDIRECT, TRIG, CHOLHDL Lab Results  Component Value Date   HGBA1C (H) 03/01/2010    5.7 (NOTE)  According to the ADA Clinical Practice Recommendations for 2011, when HbA1c is used as a screening test:   >=6.5%   Diagnostic of Diabetes Mellitus           (if abnormal result  is confirmed)  5.7-6.4%   Increased risk of developing  Diabetes Mellitus  References:Diagnosis and Classification of Diabetes Mellitus,Diabetes Care,2011,34(Suppl 1):S62-S69 and Standards of Medical Care in         Diabetes - 2011,Diabetes Care,2011,34  (Suppl 1):S11-S61.   Lab Results  Component Value Date   VITAMINB12 486 12/29/2021   Lab Results  Component Value Date   TSH 4.293 04/12/2019    02/24/24 MRI brain  1. No acute intracranial process.  2. Fairly extensive white matter T2/FLAIR hyperintensities, likely related to chronic microvascular ischemic changes.  3. Cerebral, cerebellar and hippocampal volume loss.     ASSESSMENT AND PLAN  89 y.o. year old female here with:   Dx:  1. Moderate dementia with other behavioral disturbance, unspecified dementia type (HCC)       PLAN:  MODERATE DEMENTIA WITH OTHER BEHAVIOR CHANGES (onset ~2023; MMSE 13/30; decline in ADLs) - try to stay active physically and get some exercise (at least 15-30 minutes per day) - eat a nutritious diet with lean protein, plants / vegetables, whole grains; avoid ultra-processed foods - increase social activities, brain stimulation, games, puzzles, hobbies, crafts, arts, music; try new activities; keep it fun! - aim for at least 7-8 hours sleep per night (or more) - avoid smoking and alcohol  - NEEDS SUPERVISION OF: medications, finances; no driving; no living alone - safety / supervision issues reviewed; currently has in home aid from 8:30a-5:30p; likely needs more hours (24 hours) - caregiver resources provided (including [email or web address])  LEG PAIN / WEAKNESS (history of falls; s/p bilateral hip fracture and hip replacements) - continue supportive care, PT, walker, fall precautions  Return for return to PCP, pending if symptoms worsen or fail to improve.    Omega Bible, MD 04/18/2024, 10:12 AM Certified in Neurology, Neurophysiology and Neuroimaging  [facility] [address] [place], [place] [address] [phone]

## 2024-04-18 NOTE — Patient Instructions (Addendum)
\  MODERATE DEMENTIA WITH OTHER BEHAVIOR CHANGES - try to stay active physically and get some exercise (at least 15-30 minutes per day) - eat a nutritious diet with lean protein, plants / vegetables, whole grains; avoid ultra-processed foods - increase social activities, brain stimulation, games, puzzles, hobbies, crafts, arts, music; try new activities; keep it fun! - aim for at least 7-8 hours sleep per night (or more) - avoid smoking and alcohol  - NEEDS SUPERVISION OF: medications, finances; no driving; no living alone - safety / supervision issues reviewed; currently has in home aid from 8:30a-5:30p; likely needs more hours (24 hours) - caregiver resources provided (including [email or web address])  LEG PAIN / WEAKNESS (history of falls; s/p bilateral hip fracture and hip replacements) - continue supportive care, PT, walker, fall precautions

## 2024-04-24 DIAGNOSIS — M79669 Pain in unspecified lower leg: Secondary | ICD-10-CM | POA: Diagnosis not present

## 2024-04-24 DIAGNOSIS — R2681 Unsteadiness on feet: Secondary | ICD-10-CM | POA: Diagnosis not present

## 2024-05-08 DIAGNOSIS — M79669 Pain in unspecified lower leg: Secondary | ICD-10-CM | POA: Diagnosis not present

## 2024-05-08 DIAGNOSIS — R2681 Unsteadiness on feet: Secondary | ICD-10-CM | POA: Diagnosis not present

## 2024-06-16 ENCOUNTER — Other Ambulatory Visit: Payer: Self-pay

## 2024-06-20 ENCOUNTER — Other Ambulatory Visit: Payer: Self-pay

## 2024-06-27 ENCOUNTER — Other Ambulatory Visit: Payer: Self-pay

## 2024-06-27 DIAGNOSIS — R413 Other amnesia: Secondary | ICD-10-CM | POA: Diagnosis not present

## 2024-06-27 DIAGNOSIS — I10 Essential (primary) hypertension: Secondary | ICD-10-CM | POA: Diagnosis not present

## 2024-06-27 DIAGNOSIS — E039 Hypothyroidism, unspecified: Secondary | ICD-10-CM | POA: Diagnosis not present

## 2024-06-27 DIAGNOSIS — E785 Hyperlipidemia, unspecified: Secondary | ICD-10-CM | POA: Diagnosis not present

## 2024-06-28 ENCOUNTER — Other Ambulatory Visit: Payer: Self-pay

## 2024-06-28 NOTE — Progress Notes (Signed)
 Specialty Pharmacy Refill Coordination Note  Cheryl Maldonado is a 89 y.o. female contacted today regarding refills of specialty medication(s) Denosumab  (Prolia )   Patient requested Courier to Provider Office   Delivery date: 07/11/24   Verified address: [facility] [id number]   Medication will be filled on 07/10/24.   Appointment: 07/18/24

## 2024-06-29 ENCOUNTER — Other Ambulatory Visit (HOSPITAL_COMMUNITY): Payer: Self-pay

## 2024-07-04 ENCOUNTER — Other Ambulatory Visit: Payer: Self-pay

## 2024-07-04 DIAGNOSIS — E673 Hypervitaminosis D: Secondary | ICD-10-CM | POA: Diagnosis not present

## 2024-07-04 DIAGNOSIS — I10 Essential (primary) hypertension: Secondary | ICD-10-CM | POA: Diagnosis not present

## 2024-07-04 DIAGNOSIS — N390 Urinary tract infection, site not specified: Secondary | ICD-10-CM | POA: Diagnosis not present

## 2024-07-04 DIAGNOSIS — Z0000 Encounter for general adult medical examination without abnormal findings: Secondary | ICD-10-CM | POA: Diagnosis not present

## 2024-07-04 DIAGNOSIS — M810 Age-related osteoporosis without current pathological fracture: Secondary | ICD-10-CM | POA: Diagnosis not present

## 2024-07-04 DIAGNOSIS — E039 Hypothyroidism, unspecified: Secondary | ICD-10-CM | POA: Diagnosis not present

## 2024-07-04 DIAGNOSIS — E785 Hyperlipidemia, unspecified: Secondary | ICD-10-CM | POA: Diagnosis not present

## 2024-07-04 MED ORDER — PROLIA 60 MG/ML ~~LOC~~ SOSY
PREFILLED_SYRINGE | SUBCUTANEOUS | 1 refills | Status: AC
  Filled 2024-07-04: qty 1, 180d supply, fill #0

## 2024-07-05 ENCOUNTER — Other Ambulatory Visit: Payer: Self-pay

## 2024-07-05 MED ORDER — PROLIA 60 MG/ML ~~LOC~~ SOSY: PREFILLED_SYRINGE | SUBCUTANEOUS | 1 refills | Status: AC

## 2024-07-10 ENCOUNTER — Other Ambulatory Visit: Payer: Self-pay

## 2024-07-18 DIAGNOSIS — M8000XD Age-related osteoporosis with current pathological fracture, unspecified site, subsequent encounter for fracture with routine healing: Secondary | ICD-10-CM | POA: Diagnosis not present

## 2024-08-08 ENCOUNTER — Encounter: Payer: Self-pay | Admitting: Diagnostic Neuroimaging

## 2024-08-09 MED ORDER — QUETIAPINE FUMARATE 25 MG PO TABS
12.5000 mg | ORAL_TABLET | Freq: Every day | ORAL | 1 refills | Status: AC | PRN
Start: 2024-08-09 — End: ?

## 2024-08-09 NOTE — Addendum Note (Signed)
 Addended by: MARGARET CARNE R on: 08/09/2024 11:33 AM   Modules accepted: Orders

## 2024-08-15 DIAGNOSIS — E039 Hypothyroidism, unspecified: Secondary | ICD-10-CM | POA: Diagnosis not present

## 2024-08-22 ENCOUNTER — Encounter: Payer: Self-pay | Admitting: Diagnostic Neuroimaging
